# Patient Record
Sex: Female | Born: 1935
Health system: Southern US, Community
[De-identification: ages and names within clinical notes are randomized; demographics above are authoritative.]

## PROBLEM LIST (undated history)

## (undated) DIAGNOSIS — E785 Hyperlipidemia, unspecified: Secondary | ICD-10-CM

## (undated) DIAGNOSIS — I2510 Atherosclerotic heart disease of native coronary artery without angina pectoris: Secondary | ICD-10-CM

## (undated) DIAGNOSIS — F0390 Unspecified dementia without behavioral disturbance: Secondary | ICD-10-CM

## (undated) DIAGNOSIS — E119 Type 2 diabetes mellitus without complications: Secondary | ICD-10-CM

## (undated) DIAGNOSIS — I10 Essential (primary) hypertension: Secondary | ICD-10-CM

## (undated) DIAGNOSIS — E079 Disorder of thyroid, unspecified: Secondary | ICD-10-CM

## (undated) DIAGNOSIS — M1990 Unspecified osteoarthritis, unspecified site: Secondary | ICD-10-CM

## (undated) DIAGNOSIS — I701 Atherosclerosis of renal artery: Secondary | ICD-10-CM

## (undated) HISTORY — DX: Type 2 diabetes mellitus without complications: E11.9

## (undated) HISTORY — DX: Essential (primary) hypertension: I10

## (undated) HISTORY — DX: Hyperlipidemia, unspecified: E78.5

## (undated) HISTORY — PX: ABDOMINAL HYSTERECTOMY: SHX81

## (undated) HISTORY — PX: BREAST SURGERY: SHX581

## (undated) HISTORY — PX: MITRAL VALVE REPLACEMENT (MVR)/CORONARY ARTERY BYPASS GRAFTING (CABG): SHX5984

## (undated) HISTORY — DX: Unspecified osteoarthritis, unspecified site: M19.90

## (undated) HISTORY — PX: OTHER SURGICAL HISTORY: SHX169

## (undated) HISTORY — DX: Atherosclerosis of renal artery: I70.1

## (undated) HISTORY — PX: CORONARY ARTERY BYPASS GRAFT: SHX141

## (undated) HISTORY — DX: Atherosclerotic heart disease of native coronary artery without angina pectoris: I25.10

## (undated) HISTORY — PX: CHOLECYSTECTOMY: SHX55

## (undated) HISTORY — DX: Disorder of thyroid, unspecified: E07.9

## (undated) HISTORY — DX: Unspecified dementia, unspecified severity, without behavioral disturbance, psychotic disturbance, mood disturbance, and anxiety: F03.90

---

## 2004-03-19 ENCOUNTER — Ambulatory Visit: Payer: Self-pay | Admitting: Unknown Physician Specialty

## 2004-03-19 ENCOUNTER — Other Ambulatory Visit: Payer: Self-pay

## 2004-03-25 ENCOUNTER — Ambulatory Visit: Payer: Self-pay | Admitting: Unknown Physician Specialty

## 2004-06-20 ENCOUNTER — Ambulatory Visit: Payer: Self-pay | Admitting: Internal Medicine

## 2004-09-12 ENCOUNTER — Ambulatory Visit: Payer: Self-pay | Admitting: Internal Medicine

## 2005-06-19 ENCOUNTER — Ambulatory Visit: Payer: Self-pay | Admitting: Unknown Physician Specialty

## 2005-06-19 ENCOUNTER — Other Ambulatory Visit: Payer: Self-pay

## 2005-06-20 ENCOUNTER — Ambulatory Visit: Payer: Self-pay | Admitting: Unknown Physician Specialty

## 2005-06-20 HISTORY — PX: OTHER SURGICAL HISTORY: SHX169

## 2005-11-18 ENCOUNTER — Ambulatory Visit: Payer: Self-pay | Admitting: Internal Medicine

## 2006-02-18 ENCOUNTER — Ambulatory Visit: Payer: Self-pay | Admitting: Internal Medicine

## 2006-02-25 ENCOUNTER — Ambulatory Visit: Payer: Self-pay | Admitting: Internal Medicine

## 2006-05-25 ENCOUNTER — Ambulatory Visit: Payer: Self-pay | Admitting: Internal Medicine

## 2006-09-15 ENCOUNTER — Ambulatory Visit: Payer: Self-pay | Admitting: Internal Medicine

## 2007-02-03 ENCOUNTER — Ambulatory Visit: Payer: Self-pay | Admitting: General Surgery

## 2007-03-05 ENCOUNTER — Inpatient Hospital Stay: Payer: Self-pay | Admitting: Internal Medicine

## 2007-03-05 ENCOUNTER — Other Ambulatory Visit: Payer: Self-pay

## 2007-08-12 ENCOUNTER — Ambulatory Visit: Payer: Self-pay | Admitting: Internal Medicine

## 2007-08-18 ENCOUNTER — Ambulatory Visit: Payer: Self-pay | Admitting: Internal Medicine

## 2007-09-20 ENCOUNTER — Ambulatory Visit: Payer: Self-pay | Admitting: General Surgery

## 2007-10-05 ENCOUNTER — Ambulatory Visit: Payer: Self-pay | Admitting: General Surgery

## 2007-10-06 ENCOUNTER — Ambulatory Visit: Payer: Self-pay | Admitting: General Surgery

## 2008-02-07 ENCOUNTER — Ambulatory Visit: Payer: Self-pay | Admitting: General Surgery

## 2008-07-31 ENCOUNTER — Ambulatory Visit: Payer: Self-pay | Admitting: Unknown Physician Specialty

## 2008-12-18 ENCOUNTER — Ambulatory Visit: Payer: Self-pay | Admitting: Nephrology

## 2009-01-04 ENCOUNTER — Ambulatory Visit: Payer: Self-pay | Admitting: Internal Medicine

## 2009-02-05 ENCOUNTER — Ambulatory Visit: Payer: Self-pay | Admitting: Unknown Physician Specialty

## 2009-02-12 ENCOUNTER — Ambulatory Visit: Payer: Self-pay | Admitting: Internal Medicine

## 2009-10-17 ENCOUNTER — Ambulatory Visit: Payer: Self-pay | Admitting: Pain Medicine

## 2009-11-08 ENCOUNTER — Ambulatory Visit: Payer: Self-pay | Admitting: Pain Medicine

## 2009-11-21 ENCOUNTER — Ambulatory Visit: Payer: Self-pay | Admitting: Pain Medicine

## 2009-12-06 ENCOUNTER — Ambulatory Visit: Payer: Self-pay | Admitting: Pain Medicine

## 2009-12-20 ENCOUNTER — Ambulatory Visit: Payer: Self-pay | Admitting: Pain Medicine

## 2010-01-02 ENCOUNTER — Ambulatory Visit: Payer: Self-pay | Admitting: Pain Medicine

## 2010-01-30 ENCOUNTER — Ambulatory Visit: Payer: Self-pay | Admitting: Unknown Physician Specialty

## 2010-02-08 ENCOUNTER — Ambulatory Visit: Payer: Self-pay | Admitting: Unknown Physician Specialty

## 2010-02-14 ENCOUNTER — Inpatient Hospital Stay: Payer: Self-pay | Admitting: Unknown Physician Specialty

## 2010-02-19 ENCOUNTER — Encounter: Payer: Self-pay | Admitting: Internal Medicine

## 2010-02-26 ENCOUNTER — Encounter: Payer: Self-pay | Admitting: Internal Medicine

## 2010-08-27 ENCOUNTER — Ambulatory Visit: Payer: Self-pay | Admitting: Ophthalmology

## 2010-09-09 ENCOUNTER — Ambulatory Visit: Payer: Self-pay | Admitting: Ophthalmology

## 2011-05-06 DIAGNOSIS — I2510 Atherosclerotic heart disease of native coronary artery without angina pectoris: Secondary | ICD-10-CM | POA: Diagnosis not present

## 2011-05-06 DIAGNOSIS — I119 Hypertensive heart disease without heart failure: Secondary | ICD-10-CM | POA: Diagnosis not present

## 2011-05-06 DIAGNOSIS — I059 Rheumatic mitral valve disease, unspecified: Secondary | ICD-10-CM | POA: Diagnosis not present

## 2011-05-06 DIAGNOSIS — E782 Mixed hyperlipidemia: Secondary | ICD-10-CM | POA: Diagnosis not present

## 2011-05-22 DIAGNOSIS — H3530 Unspecified macular degeneration: Secondary | ICD-10-CM | POA: Diagnosis not present

## 2011-06-17 DIAGNOSIS — N184 Chronic kidney disease, stage 4 (severe): Secondary | ICD-10-CM | POA: Diagnosis not present

## 2011-06-17 DIAGNOSIS — I701 Atherosclerosis of renal artery: Secondary | ICD-10-CM | POA: Diagnosis not present

## 2011-06-17 DIAGNOSIS — N2581 Secondary hyperparathyroidism of renal origin: Secondary | ICD-10-CM | POA: Diagnosis not present

## 2011-06-17 DIAGNOSIS — I10 Essential (primary) hypertension: Secondary | ICD-10-CM | POA: Diagnosis not present

## 2011-06-23 DIAGNOSIS — F329 Major depressive disorder, single episode, unspecified: Secondary | ICD-10-CM | POA: Diagnosis not present

## 2011-06-23 DIAGNOSIS — E039 Hypothyroidism, unspecified: Secondary | ICD-10-CM | POA: Diagnosis not present

## 2011-06-23 DIAGNOSIS — M159 Polyosteoarthritis, unspecified: Secondary | ICD-10-CM | POA: Diagnosis not present

## 2011-06-23 DIAGNOSIS — D518 Other vitamin B12 deficiency anemias: Secondary | ICD-10-CM | POA: Diagnosis not present

## 2011-06-23 DIAGNOSIS — I10 Essential (primary) hypertension: Secondary | ICD-10-CM | POA: Diagnosis not present

## 2011-07-14 DIAGNOSIS — M549 Dorsalgia, unspecified: Secondary | ICD-10-CM | POA: Diagnosis not present

## 2011-07-14 DIAGNOSIS — N184 Chronic kidney disease, stage 4 (severe): Secondary | ICD-10-CM | POA: Diagnosis not present

## 2011-07-14 DIAGNOSIS — M5430 Sciatica, unspecified side: Secondary | ICD-10-CM | POA: Diagnosis not present

## 2011-07-14 DIAGNOSIS — I2510 Atherosclerotic heart disease of native coronary artery without angina pectoris: Secondary | ICD-10-CM | POA: Diagnosis not present

## 2011-09-08 DIAGNOSIS — E785 Hyperlipidemia, unspecified: Secondary | ICD-10-CM | POA: Diagnosis not present

## 2011-09-08 DIAGNOSIS — I10 Essential (primary) hypertension: Secondary | ICD-10-CM | POA: Diagnosis not present

## 2011-09-08 DIAGNOSIS — E669 Obesity, unspecified: Secondary | ICD-10-CM | POA: Diagnosis not present

## 2011-09-08 DIAGNOSIS — I722 Aneurysm of renal artery: Secondary | ICD-10-CM | POA: Diagnosis not present

## 2011-09-09 ENCOUNTER — Ambulatory Visit: Payer: Self-pay | Admitting: Internal Medicine

## 2011-09-09 DIAGNOSIS — Z1231 Encounter for screening mammogram for malignant neoplasm of breast: Secondary | ICD-10-CM | POA: Diagnosis not present

## 2011-09-17 DIAGNOSIS — I701 Atherosclerosis of renal artery: Secondary | ICD-10-CM | POA: Diagnosis not present

## 2011-09-17 DIAGNOSIS — N2581 Secondary hyperparathyroidism of renal origin: Secondary | ICD-10-CM | POA: Diagnosis not present

## 2011-09-17 DIAGNOSIS — I10 Essential (primary) hypertension: Secondary | ICD-10-CM | POA: Diagnosis not present

## 2011-10-21 DIAGNOSIS — I10 Essential (primary) hypertension: Secondary | ICD-10-CM | POA: Diagnosis not present

## 2011-10-21 DIAGNOSIS — R0602 Shortness of breath: Secondary | ICD-10-CM | POA: Diagnosis not present

## 2011-10-21 DIAGNOSIS — I6529 Occlusion and stenosis of unspecified carotid artery: Secondary | ICD-10-CM | POA: Diagnosis not present

## 2011-10-21 DIAGNOSIS — Z79899 Other long term (current) drug therapy: Secondary | ICD-10-CM | POA: Diagnosis not present

## 2011-10-21 DIAGNOSIS — M109 Gout, unspecified: Secondary | ICD-10-CM | POA: Diagnosis not present

## 2011-10-21 DIAGNOSIS — E782 Mixed hyperlipidemia: Secondary | ICD-10-CM | POA: Diagnosis not present

## 2011-10-21 DIAGNOSIS — E039 Hypothyroidism, unspecified: Secondary | ICD-10-CM | POA: Diagnosis not present

## 2011-10-21 DIAGNOSIS — R5381 Other malaise: Secondary | ICD-10-CM | POA: Diagnosis not present

## 2011-10-21 DIAGNOSIS — I2510 Atherosclerotic heart disease of native coronary artery without angina pectoris: Secondary | ICD-10-CM | POA: Diagnosis not present

## 2011-10-21 DIAGNOSIS — M545 Low back pain: Secondary | ICD-10-CM | POA: Diagnosis not present

## 2011-11-03 ENCOUNTER — Ambulatory Visit: Payer: Self-pay | Admitting: Internal Medicine

## 2011-11-03 DIAGNOSIS — R918 Other nonspecific abnormal finding of lung field: Secondary | ICD-10-CM | POA: Diagnosis not present

## 2011-11-03 DIAGNOSIS — R0602 Shortness of breath: Secondary | ICD-10-CM | POA: Diagnosis not present

## 2011-11-07 DIAGNOSIS — R0602 Shortness of breath: Secondary | ICD-10-CM | POA: Diagnosis not present

## 2011-11-13 DIAGNOSIS — M1990 Unspecified osteoarthritis, unspecified site: Secondary | ICD-10-CM | POA: Diagnosis not present

## 2011-11-13 DIAGNOSIS — M549 Dorsalgia, unspecified: Secondary | ICD-10-CM | POA: Diagnosis not present

## 2011-11-28 DIAGNOSIS — E785 Hyperlipidemia, unspecified: Secondary | ICD-10-CM | POA: Diagnosis not present

## 2011-11-28 DIAGNOSIS — I10 Essential (primary) hypertension: Secondary | ICD-10-CM | POA: Diagnosis not present

## 2011-11-28 DIAGNOSIS — I2510 Atherosclerotic heart disease of native coronary artery without angina pectoris: Secondary | ICD-10-CM | POA: Diagnosis not present

## 2011-11-28 DIAGNOSIS — E119 Type 2 diabetes mellitus without complications: Secondary | ICD-10-CM | POA: Diagnosis not present

## 2011-12-12 DIAGNOSIS — N184 Chronic kidney disease, stage 4 (severe): Secondary | ICD-10-CM | POA: Diagnosis not present

## 2011-12-12 DIAGNOSIS — I10 Essential (primary) hypertension: Secondary | ICD-10-CM | POA: Diagnosis not present

## 2011-12-12 DIAGNOSIS — N2581 Secondary hyperparathyroidism of renal origin: Secondary | ICD-10-CM | POA: Diagnosis not present

## 2011-12-12 DIAGNOSIS — I701 Atherosclerosis of renal artery: Secondary | ICD-10-CM | POA: Diagnosis not present

## 2012-02-24 DIAGNOSIS — E211 Secondary hyperparathyroidism, not elsewhere classified: Secondary | ICD-10-CM | POA: Diagnosis not present

## 2012-02-24 DIAGNOSIS — I10 Essential (primary) hypertension: Secondary | ICD-10-CM | POA: Diagnosis not present

## 2012-02-24 DIAGNOSIS — Z23 Encounter for immunization: Secondary | ICD-10-CM | POA: Diagnosis not present

## 2012-02-24 DIAGNOSIS — E039 Hypothyroidism, unspecified: Secondary | ICD-10-CM | POA: Diagnosis not present

## 2012-02-24 DIAGNOSIS — E119 Type 2 diabetes mellitus without complications: Secondary | ICD-10-CM | POA: Diagnosis not present

## 2012-02-24 DIAGNOSIS — R5381 Other malaise: Secondary | ICD-10-CM | POA: Diagnosis not present

## 2012-02-24 DIAGNOSIS — D649 Anemia, unspecified: Secondary | ICD-10-CM | POA: Diagnosis not present

## 2012-02-24 DIAGNOSIS — E782 Mixed hyperlipidemia: Secondary | ICD-10-CM | POA: Diagnosis not present

## 2012-05-25 DIAGNOSIS — I701 Atherosclerosis of renal artery: Secondary | ICD-10-CM | POA: Diagnosis not present

## 2012-05-25 DIAGNOSIS — N2581 Secondary hyperparathyroidism of renal origin: Secondary | ICD-10-CM | POA: Diagnosis not present

## 2012-05-25 DIAGNOSIS — I10 Essential (primary) hypertension: Secondary | ICD-10-CM | POA: Diagnosis not present

## 2012-06-01 DIAGNOSIS — M545 Low back pain: Secondary | ICD-10-CM | POA: Diagnosis not present

## 2012-06-01 DIAGNOSIS — E875 Hyperkalemia: Secondary | ICD-10-CM | POA: Diagnosis not present

## 2012-06-01 DIAGNOSIS — E319 Polyglandular dysfunction, unspecified: Secondary | ICD-10-CM | POA: Diagnosis not present

## 2012-06-01 DIAGNOSIS — D729 Disorder of white blood cells, unspecified: Secondary | ICD-10-CM | POA: Diagnosis not present

## 2012-06-01 DIAGNOSIS — I10 Essential (primary) hypertension: Secondary | ICD-10-CM | POA: Diagnosis not present

## 2012-06-01 DIAGNOSIS — E039 Hypothyroidism, unspecified: Secondary | ICD-10-CM | POA: Diagnosis not present

## 2012-06-01 DIAGNOSIS — E119 Type 2 diabetes mellitus without complications: Secondary | ICD-10-CM | POA: Diagnosis not present

## 2012-06-07 DIAGNOSIS — M5430 Sciatica, unspecified side: Secondary | ICD-10-CM | POA: Diagnosis not present

## 2012-06-07 DIAGNOSIS — M25559 Pain in unspecified hip: Secondary | ICD-10-CM | POA: Diagnosis not present

## 2012-06-14 ENCOUNTER — Ambulatory Visit: Payer: Self-pay | Admitting: Orthopedic Surgery

## 2012-06-14 DIAGNOSIS — M5126 Other intervertebral disc displacement, lumbar region: Secondary | ICD-10-CM | POA: Diagnosis not present

## 2012-06-14 DIAGNOSIS — M47817 Spondylosis without myelopathy or radiculopathy, lumbosacral region: Secondary | ICD-10-CM | POA: Diagnosis not present

## 2012-06-17 DIAGNOSIS — E785 Hyperlipidemia, unspecified: Secondary | ICD-10-CM | POA: Diagnosis not present

## 2012-06-17 DIAGNOSIS — R0989 Other specified symptoms and signs involving the circulatory and respiratory systems: Secondary | ICD-10-CM | POA: Diagnosis not present

## 2012-06-17 DIAGNOSIS — I10 Essential (primary) hypertension: Secondary | ICD-10-CM | POA: Diagnosis not present

## 2012-06-17 DIAGNOSIS — I2510 Atherosclerotic heart disease of native coronary artery without angina pectoris: Secondary | ICD-10-CM | POA: Diagnosis not present

## 2012-06-17 DIAGNOSIS — R0609 Other forms of dyspnea: Secondary | ICD-10-CM | POA: Diagnosis not present

## 2012-07-06 DIAGNOSIS — IMO0002 Reserved for concepts with insufficient information to code with codable children: Secondary | ICD-10-CM | POA: Diagnosis not present

## 2012-07-06 DIAGNOSIS — M47817 Spondylosis without myelopathy or radiculopathy, lumbosacral region: Secondary | ICD-10-CM | POA: Diagnosis not present

## 2012-07-14 DIAGNOSIS — M48061 Spinal stenosis, lumbar region without neurogenic claudication: Secondary | ICD-10-CM | POA: Diagnosis not present

## 2012-07-14 DIAGNOSIS — E039 Hypothyroidism, unspecified: Secondary | ICD-10-CM | POA: Diagnosis not present

## 2012-07-14 DIAGNOSIS — M109 Gout, unspecified: Secondary | ICD-10-CM | POA: Diagnosis not present

## 2012-07-14 DIAGNOSIS — I10 Essential (primary) hypertension: Secondary | ICD-10-CM | POA: Diagnosis not present

## 2012-07-14 DIAGNOSIS — I2510 Atherosclerotic heart disease of native coronary artery without angina pectoris: Secondary | ICD-10-CM | POA: Diagnosis not present

## 2012-07-14 DIAGNOSIS — Z006 Encounter for examination for normal comparison and control in clinical research program: Secondary | ICD-10-CM | POA: Diagnosis not present

## 2012-07-14 DIAGNOSIS — R05 Cough: Secondary | ICD-10-CM | POA: Diagnosis not present

## 2012-07-14 DIAGNOSIS — F329 Major depressive disorder, single episode, unspecified: Secondary | ICD-10-CM | POA: Diagnosis not present

## 2012-07-14 DIAGNOSIS — E119 Type 2 diabetes mellitus without complications: Secondary | ICD-10-CM | POA: Diagnosis not present

## 2012-07-28 DIAGNOSIS — M961 Postlaminectomy syndrome, not elsewhere classified: Secondary | ICD-10-CM | POA: Diagnosis not present

## 2012-07-30 DIAGNOSIS — H3530 Unspecified macular degeneration: Secondary | ICD-10-CM | POA: Diagnosis not present

## 2012-08-17 DIAGNOSIS — H905 Unspecified sensorineural hearing loss: Secondary | ICD-10-CM | POA: Diagnosis not present

## 2012-09-14 DIAGNOSIS — N2581 Secondary hyperparathyroidism of renal origin: Secondary | ICD-10-CM | POA: Diagnosis not present

## 2012-09-14 DIAGNOSIS — I10 Essential (primary) hypertension: Secondary | ICD-10-CM | POA: Diagnosis not present

## 2012-09-14 DIAGNOSIS — I701 Atherosclerosis of renal artery: Secondary | ICD-10-CM | POA: Diagnosis not present

## 2012-09-14 DIAGNOSIS — N184 Chronic kidney disease, stage 4 (severe): Secondary | ICD-10-CM | POA: Diagnosis not present

## 2012-10-05 DIAGNOSIS — I10 Essential (primary) hypertension: Secondary | ICD-10-CM | POA: Diagnosis not present

## 2012-10-05 DIAGNOSIS — D649 Anemia, unspecified: Secondary | ICD-10-CM | POA: Diagnosis not present

## 2012-10-05 DIAGNOSIS — M5137 Other intervertebral disc degeneration, lumbosacral region: Secondary | ICD-10-CM | POA: Diagnosis not present

## 2012-10-05 DIAGNOSIS — E039 Hypothyroidism, unspecified: Secondary | ICD-10-CM | POA: Diagnosis not present

## 2012-10-05 DIAGNOSIS — M159 Polyosteoarthritis, unspecified: Secondary | ICD-10-CM | POA: Diagnosis not present

## 2012-10-05 DIAGNOSIS — E1159 Type 2 diabetes mellitus with other circulatory complications: Secondary | ICD-10-CM | POA: Diagnosis not present

## 2012-10-05 DIAGNOSIS — I2510 Atherosclerotic heart disease of native coronary artery without angina pectoris: Secondary | ICD-10-CM | POA: Diagnosis not present

## 2012-11-11 DIAGNOSIS — M109 Gout, unspecified: Secondary | ICD-10-CM | POA: Diagnosis not present

## 2012-11-11 DIAGNOSIS — E559 Vitamin D deficiency, unspecified: Secondary | ICD-10-CM | POA: Diagnosis not present

## 2012-11-11 DIAGNOSIS — N183 Chronic kidney disease, stage 3 unspecified: Secondary | ICD-10-CM | POA: Diagnosis not present

## 2012-11-11 DIAGNOSIS — E039 Hypothyroidism, unspecified: Secondary | ICD-10-CM | POA: Diagnosis not present

## 2012-11-11 DIAGNOSIS — Z0000 Encounter for general adult medical examination without abnormal findings: Secondary | ICD-10-CM | POA: Diagnosis not present

## 2012-11-11 DIAGNOSIS — E782 Mixed hyperlipidemia: Secondary | ICD-10-CM | POA: Diagnosis not present

## 2012-12-06 DIAGNOSIS — N184 Chronic kidney disease, stage 4 (severe): Secondary | ICD-10-CM | POA: Diagnosis not present

## 2012-12-06 DIAGNOSIS — I10 Essential (primary) hypertension: Secondary | ICD-10-CM | POA: Diagnosis not present

## 2012-12-06 DIAGNOSIS — N2581 Secondary hyperparathyroidism of renal origin: Secondary | ICD-10-CM | POA: Diagnosis not present

## 2012-12-06 DIAGNOSIS — I701 Atherosclerosis of renal artery: Secondary | ICD-10-CM | POA: Diagnosis not present

## 2012-12-21 DIAGNOSIS — I209 Angina pectoris, unspecified: Secondary | ICD-10-CM | POA: Diagnosis not present

## 2012-12-21 DIAGNOSIS — R0602 Shortness of breath: Secondary | ICD-10-CM | POA: Diagnosis not present

## 2012-12-21 DIAGNOSIS — I059 Rheumatic mitral valve disease, unspecified: Secondary | ICD-10-CM | POA: Diagnosis not present

## 2012-12-21 DIAGNOSIS — E782 Mixed hyperlipidemia: Secondary | ICD-10-CM | POA: Diagnosis not present

## 2013-01-12 DIAGNOSIS — E119 Type 2 diabetes mellitus without complications: Secondary | ICD-10-CM | POA: Diagnosis not present

## 2013-01-12 DIAGNOSIS — Z0000 Encounter for general adult medical examination without abnormal findings: Secondary | ICD-10-CM | POA: Diagnosis not present

## 2013-01-12 DIAGNOSIS — E559 Vitamin D deficiency, unspecified: Secondary | ICD-10-CM | POA: Diagnosis not present

## 2013-01-12 DIAGNOSIS — D518 Other vitamin B12 deficiency anemias: Secondary | ICD-10-CM | POA: Diagnosis not present

## 2013-01-12 DIAGNOSIS — E782 Mixed hyperlipidemia: Secondary | ICD-10-CM | POA: Diagnosis not present

## 2013-01-12 DIAGNOSIS — M109 Gout, unspecified: Secondary | ICD-10-CM | POA: Diagnosis not present

## 2013-01-12 DIAGNOSIS — E039 Hypothyroidism, unspecified: Secondary | ICD-10-CM | POA: Diagnosis not present

## 2013-01-17 DIAGNOSIS — E782 Mixed hyperlipidemia: Secondary | ICD-10-CM | POA: Diagnosis not present

## 2013-01-17 DIAGNOSIS — I2510 Atherosclerotic heart disease of native coronary artery without angina pectoris: Secondary | ICD-10-CM | POA: Diagnosis not present

## 2013-01-17 DIAGNOSIS — I119 Hypertensive heart disease without heart failure: Secondary | ICD-10-CM | POA: Diagnosis not present

## 2013-01-17 DIAGNOSIS — I059 Rheumatic mitral valve disease, unspecified: Secondary | ICD-10-CM | POA: Diagnosis not present

## 2013-02-03 DIAGNOSIS — Z23 Encounter for immunization: Secondary | ICD-10-CM | POA: Diagnosis not present

## 2013-02-15 DIAGNOSIS — M48061 Spinal stenosis, lumbar region without neurogenic claudication: Secondary | ICD-10-CM | POA: Diagnosis not present

## 2013-02-15 DIAGNOSIS — M545 Low back pain: Secondary | ICD-10-CM | POA: Diagnosis not present

## 2013-02-15 DIAGNOSIS — IMO0002 Reserved for concepts with insufficient information to code with codable children: Secondary | ICD-10-CM | POA: Diagnosis not present

## 2013-02-28 DIAGNOSIS — N2581 Secondary hyperparathyroidism of renal origin: Secondary | ICD-10-CM | POA: Diagnosis not present

## 2013-02-28 DIAGNOSIS — I701 Atherosclerosis of renal artery: Secondary | ICD-10-CM | POA: Diagnosis not present

## 2013-02-28 DIAGNOSIS — N184 Chronic kidney disease, stage 4 (severe): Secondary | ICD-10-CM | POA: Diagnosis not present

## 2013-02-28 DIAGNOSIS — I10 Essential (primary) hypertension: Secondary | ICD-10-CM | POA: Diagnosis not present

## 2013-03-01 ENCOUNTER — Other Ambulatory Visit: Payer: Self-pay | Admitting: Neurosurgery

## 2013-03-01 DIAGNOSIS — M545 Low back pain: Secondary | ICD-10-CM

## 2013-03-07 ENCOUNTER — Ambulatory Visit
Admission: RE | Admit: 2013-03-07 | Discharge: 2013-03-07 | Disposition: A | Discharge status: Home / Self Care | Payer: PRIVATE HEALTH INSURANCE | Source: Ambulatory Visit | Attending: Neurosurgery | Admitting: Neurosurgery

## 2013-03-07 DIAGNOSIS — M48061 Spinal stenosis, lumbar region without neurogenic claudication: Secondary | ICD-10-CM | POA: Diagnosis not present

## 2013-03-07 DIAGNOSIS — M545 Low back pain: Secondary | ICD-10-CM

## 2013-03-07 DIAGNOSIS — M5137 Other intervertebral disc degeneration, lumbosacral region: Secondary | ICD-10-CM | POA: Diagnosis not present

## 2013-03-07 DIAGNOSIS — M5126 Other intervertebral disc displacement, lumbar region: Secondary | ICD-10-CM | POA: Diagnosis not present

## 2013-03-07 DIAGNOSIS — M47817 Spondylosis without myelopathy or radiculopathy, lumbosacral region: Secondary | ICD-10-CM | POA: Diagnosis not present

## 2013-03-07 MED ORDER — IOHEXOL 180 MG/ML  SOLN
15.0000 mL | Freq: Once | INTRAMUSCULAR | Status: AC | PRN
  Administered 2013-03-07: 15 mL via INTRATHECAL

## 2013-03-07 MED ORDER — DIAZEPAM 5 MG PO TABS
10.0000 mg | ORAL_TABLET | Freq: Once | ORAL | Status: AC
  Administered 2013-03-07: 5 mg via ORAL

## 2013-03-07 NOTE — Progress Notes (Signed)
Patient states her last dose of Tramadol was two days ago.  jkl

## 2013-03-11 DIAGNOSIS — M549 Dorsalgia, unspecified: Secondary | ICD-10-CM | POA: Diagnosis not present

## 2013-05-09 DIAGNOSIS — E785 Hyperlipidemia, unspecified: Secondary | ICD-10-CM | POA: Diagnosis not present

## 2013-05-09 DIAGNOSIS — I10 Essential (primary) hypertension: Secondary | ICD-10-CM | POA: Diagnosis not present

## 2013-05-09 DIAGNOSIS — I25810 Atherosclerosis of coronary artery bypass graft(s) without angina pectoris: Secondary | ICD-10-CM | POA: Diagnosis not present

## 2013-05-09 DIAGNOSIS — I059 Rheumatic mitral valve disease, unspecified: Secondary | ICD-10-CM | POA: Diagnosis not present

## 2013-05-17 DIAGNOSIS — I259 Chronic ischemic heart disease, unspecified: Secondary | ICD-10-CM | POA: Diagnosis not present

## 2013-05-17 DIAGNOSIS — I10 Essential (primary) hypertension: Secondary | ICD-10-CM | POA: Diagnosis not present

## 2013-05-17 DIAGNOSIS — R1909 Other intra-abdominal and pelvic swelling, mass and lump: Secondary | ICD-10-CM | POA: Diagnosis not present

## 2013-05-17 DIAGNOSIS — N183 Chronic kidney disease, stage 3 unspecified: Secondary | ICD-10-CM | POA: Diagnosis not present

## 2013-05-17 DIAGNOSIS — R6889 Other general symptoms and signs: Secondary | ICD-10-CM | POA: Diagnosis not present

## 2013-05-26 ENCOUNTER — Ambulatory Visit: Payer: Self-pay | Admitting: Physician Assistant

## 2013-05-26 DIAGNOSIS — R05 Cough: Secondary | ICD-10-CM | POA: Diagnosis not present

## 2013-05-26 DIAGNOSIS — R109 Unspecified abdominal pain: Secondary | ICD-10-CM | POA: Diagnosis not present

## 2013-05-26 DIAGNOSIS — R1909 Other intra-abdominal and pelvic swelling, mass and lump: Secondary | ICD-10-CM | POA: Diagnosis not present

## 2013-05-26 DIAGNOSIS — R059 Cough, unspecified: Secondary | ICD-10-CM | POA: Diagnosis not present

## 2013-06-27 DIAGNOSIS — F329 Major depressive disorder, single episode, unspecified: Secondary | ICD-10-CM | POA: Diagnosis not present

## 2013-06-27 DIAGNOSIS — M109 Gout, unspecified: Secondary | ICD-10-CM | POA: Diagnosis not present

## 2013-06-27 DIAGNOSIS — I10 Essential (primary) hypertension: Secondary | ICD-10-CM | POA: Diagnosis not present

## 2013-06-27 DIAGNOSIS — E782 Mixed hyperlipidemia: Secondary | ICD-10-CM | POA: Diagnosis not present

## 2013-06-27 DIAGNOSIS — F3289 Other specified depressive episodes: Secondary | ICD-10-CM | POA: Diagnosis not present

## 2013-07-19 DIAGNOSIS — Z79899 Other long term (current) drug therapy: Secondary | ICD-10-CM | POA: Diagnosis not present

## 2013-07-19 DIAGNOSIS — E039 Hypothyroidism, unspecified: Secondary | ICD-10-CM | POA: Diagnosis not present

## 2013-07-19 DIAGNOSIS — E782 Mixed hyperlipidemia: Secondary | ICD-10-CM | POA: Diagnosis not present

## 2013-08-09 DIAGNOSIS — F329 Major depressive disorder, single episode, unspecified: Secondary | ICD-10-CM | POA: Diagnosis not present

## 2013-08-09 DIAGNOSIS — M109 Gout, unspecified: Secondary | ICD-10-CM | POA: Diagnosis not present

## 2013-08-09 DIAGNOSIS — E782 Mixed hyperlipidemia: Secondary | ICD-10-CM | POA: Diagnosis not present

## 2013-08-09 DIAGNOSIS — F3289 Other specified depressive episodes: Secondary | ICD-10-CM | POA: Diagnosis not present

## 2013-08-09 DIAGNOSIS — I10 Essential (primary) hypertension: Secondary | ICD-10-CM | POA: Diagnosis not present

## 2013-08-31 DIAGNOSIS — H2510 Age-related nuclear cataract, unspecified eye: Secondary | ICD-10-CM | POA: Diagnosis not present

## 2013-09-22 DIAGNOSIS — M79609 Pain in unspecified limb: Secondary | ICD-10-CM | POA: Diagnosis not present

## 2013-09-22 DIAGNOSIS — S9030XA Contusion of unspecified foot, initial encounter: Secondary | ICD-10-CM | POA: Diagnosis not present

## 2013-10-06 DIAGNOSIS — M79609 Pain in unspecified limb: Secondary | ICD-10-CM | POA: Diagnosis not present

## 2013-10-06 DIAGNOSIS — S93609A Unspecified sprain of unspecified foot, initial encounter: Secondary | ICD-10-CM | POA: Diagnosis not present

## 2013-11-16 DIAGNOSIS — E785 Hyperlipidemia, unspecified: Secondary | ICD-10-CM | POA: Diagnosis not present

## 2013-11-16 DIAGNOSIS — I10 Essential (primary) hypertension: Secondary | ICD-10-CM | POA: Diagnosis not present

## 2013-11-16 DIAGNOSIS — I25810 Atherosclerosis of coronary artery bypass graft(s) without angina pectoris: Secondary | ICD-10-CM | POA: Diagnosis not present

## 2013-11-16 DIAGNOSIS — E782 Mixed hyperlipidemia: Secondary | ICD-10-CM | POA: Insufficient documentation

## 2013-11-16 DIAGNOSIS — I38 Endocarditis, valve unspecified: Secondary | ICD-10-CM | POA: Diagnosis not present

## 2013-11-17 DIAGNOSIS — I10 Essential (primary) hypertension: Secondary | ICD-10-CM | POA: Diagnosis not present

## 2013-11-17 DIAGNOSIS — N183 Chronic kidney disease, stage 3 unspecified: Secondary | ICD-10-CM | POA: Diagnosis not present

## 2013-11-17 DIAGNOSIS — I739 Peripheral vascular disease, unspecified: Secondary | ICD-10-CM | POA: Diagnosis not present

## 2013-11-17 DIAGNOSIS — L03119 Cellulitis of unspecified part of limb: Secondary | ICD-10-CM | POA: Diagnosis not present

## 2013-11-17 DIAGNOSIS — L02619 Cutaneous abscess of unspecified foot: Secondary | ICD-10-CM | POA: Diagnosis not present

## 2013-11-17 DIAGNOSIS — E039 Hypothyroidism, unspecified: Secondary | ICD-10-CM | POA: Diagnosis not present

## 2013-11-22 DIAGNOSIS — I25810 Atherosclerosis of coronary artery bypass graft(s) without angina pectoris: Secondary | ICD-10-CM | POA: Diagnosis not present

## 2013-11-22 DIAGNOSIS — R609 Edema, unspecified: Secondary | ICD-10-CM | POA: Diagnosis not present

## 2013-11-22 DIAGNOSIS — I10 Essential (primary) hypertension: Secondary | ICD-10-CM | POA: Diagnosis not present

## 2013-11-22 DIAGNOSIS — I38 Endocarditis, valve unspecified: Secondary | ICD-10-CM | POA: Diagnosis not present

## 2013-11-22 DIAGNOSIS — E785 Hyperlipidemia, unspecified: Secondary | ICD-10-CM | POA: Diagnosis not present

## 2013-11-23 DIAGNOSIS — R609 Edema, unspecified: Secondary | ICD-10-CM | POA: Diagnosis not present

## 2013-11-23 DIAGNOSIS — M79609 Pain in unspecified limb: Secondary | ICD-10-CM | POA: Diagnosis not present

## 2013-11-24 DIAGNOSIS — E1159 Type 2 diabetes mellitus with other circulatory complications: Secondary | ICD-10-CM | POA: Diagnosis not present

## 2013-11-24 DIAGNOSIS — N183 Chronic kidney disease, stage 3 unspecified: Secondary | ICD-10-CM | POA: Diagnosis not present

## 2013-11-24 DIAGNOSIS — G8929 Other chronic pain: Secondary | ICD-10-CM | POA: Diagnosis not present

## 2013-11-24 DIAGNOSIS — L03119 Cellulitis of unspecified part of limb: Secondary | ICD-10-CM | POA: Diagnosis not present

## 2013-11-24 DIAGNOSIS — I10 Essential (primary) hypertension: Secondary | ICD-10-CM | POA: Diagnosis not present

## 2013-11-24 DIAGNOSIS — L02619 Cutaneous abscess of unspecified foot: Secondary | ICD-10-CM | POA: Diagnosis not present

## 2013-11-24 DIAGNOSIS — I739 Peripheral vascular disease, unspecified: Secondary | ICD-10-CM | POA: Diagnosis not present

## 2013-12-08 DIAGNOSIS — E782 Mixed hyperlipidemia: Secondary | ICD-10-CM | POA: Diagnosis not present

## 2013-12-08 DIAGNOSIS — I10 Essential (primary) hypertension: Secondary | ICD-10-CM | POA: Diagnosis not present

## 2013-12-19 DIAGNOSIS — M79609 Pain in unspecified limb: Secondary | ICD-10-CM | POA: Diagnosis not present

## 2013-12-29 DIAGNOSIS — E119 Type 2 diabetes mellitus without complications: Secondary | ICD-10-CM | POA: Diagnosis not present

## 2013-12-29 DIAGNOSIS — G8929 Other chronic pain: Secondary | ICD-10-CM | POA: Diagnosis not present

## 2013-12-29 DIAGNOSIS — N183 Chronic kidney disease, stage 3 unspecified: Secondary | ICD-10-CM | POA: Diagnosis not present

## 2013-12-29 DIAGNOSIS — I10 Essential (primary) hypertension: Secondary | ICD-10-CM | POA: Diagnosis not present

## 2013-12-29 DIAGNOSIS — L02619 Cutaneous abscess of unspecified foot: Secondary | ICD-10-CM | POA: Diagnosis not present

## 2014-02-14 DIAGNOSIS — I150 Renovascular hypertension: Secondary | ICD-10-CM | POA: Diagnosis not present

## 2014-02-14 DIAGNOSIS — M150 Primary generalized (osteo)arthritis: Secondary | ICD-10-CM | POA: Diagnosis not present

## 2014-02-14 DIAGNOSIS — E782 Mixed hyperlipidemia: Secondary | ICD-10-CM | POA: Diagnosis not present

## 2014-02-14 DIAGNOSIS — Z23 Encounter for immunization: Secondary | ICD-10-CM | POA: Diagnosis not present

## 2014-02-14 DIAGNOSIS — I259 Chronic ischemic heart disease, unspecified: Secondary | ICD-10-CM | POA: Diagnosis not present

## 2014-02-14 DIAGNOSIS — N183 Chronic kidney disease, stage 3 (moderate): Secondary | ICD-10-CM | POA: Diagnosis not present

## 2014-02-14 DIAGNOSIS — M10479 Other secondary gout, unspecified ankle and foot: Secondary | ICD-10-CM | POA: Diagnosis not present

## 2014-02-14 DIAGNOSIS — M5441 Lumbago with sciatica, right side: Secondary | ICD-10-CM | POA: Diagnosis not present

## 2014-03-09 DIAGNOSIS — N183 Chronic kidney disease, stage 3 (moderate): Secondary | ICD-10-CM | POA: Diagnosis not present

## 2014-03-09 DIAGNOSIS — E782 Mixed hyperlipidemia: Secondary | ICD-10-CM | POA: Diagnosis not present

## 2014-04-04 DIAGNOSIS — E039 Hypothyroidism, unspecified: Secondary | ICD-10-CM | POA: Diagnosis not present

## 2014-04-04 DIAGNOSIS — M5441 Lumbago with sciatica, right side: Secondary | ICD-10-CM | POA: Diagnosis not present

## 2014-04-04 DIAGNOSIS — I25111 Atherosclerotic heart disease of native coronary artery with angina pectoris with documented spasm: Secondary | ICD-10-CM | POA: Diagnosis not present

## 2014-04-04 DIAGNOSIS — I150 Renovascular hypertension: Secondary | ICD-10-CM | POA: Diagnosis not present

## 2014-04-04 DIAGNOSIS — N183 Chronic kidney disease, stage 3 (moderate): Secondary | ICD-10-CM | POA: Diagnosis not present

## 2014-04-04 DIAGNOSIS — E119 Type 2 diabetes mellitus without complications: Secondary | ICD-10-CM | POA: Diagnosis not present

## 2014-04-12 DIAGNOSIS — N183 Chronic kidney disease, stage 3 (moderate): Secondary | ICD-10-CM | POA: Diagnosis not present

## 2014-04-12 DIAGNOSIS — I10 Essential (primary) hypertension: Secondary | ICD-10-CM | POA: Diagnosis not present

## 2014-05-03 DIAGNOSIS — I38 Endocarditis, valve unspecified: Secondary | ICD-10-CM | POA: Diagnosis not present

## 2014-05-03 DIAGNOSIS — I10 Essential (primary) hypertension: Secondary | ICD-10-CM | POA: Diagnosis not present

## 2014-05-03 DIAGNOSIS — I25810 Atherosclerosis of coronary artery bypass graft(s) without angina pectoris: Secondary | ICD-10-CM | POA: Diagnosis not present

## 2014-05-03 DIAGNOSIS — E119 Type 2 diabetes mellitus without complications: Secondary | ICD-10-CM | POA: Diagnosis not present

## 2014-05-09 DIAGNOSIS — N183 Chronic kidney disease, stage 3 (moderate): Secondary | ICD-10-CM | POA: Diagnosis not present

## 2014-05-09 DIAGNOSIS — Z0001 Encounter for general adult medical examination with abnormal findings: Secondary | ICD-10-CM | POA: Diagnosis not present

## 2014-05-09 DIAGNOSIS — I150 Renovascular hypertension: Secondary | ICD-10-CM | POA: Diagnosis not present

## 2014-05-09 DIAGNOSIS — E119 Type 2 diabetes mellitus without complications: Secondary | ICD-10-CM | POA: Diagnosis not present

## 2014-05-09 DIAGNOSIS — M150 Primary generalized (osteo)arthritis: Secondary | ICD-10-CM | POA: Diagnosis not present

## 2014-05-09 DIAGNOSIS — N390 Urinary tract infection, site not specified: Secondary | ICD-10-CM | POA: Diagnosis not present

## 2014-06-22 DIAGNOSIS — N183 Chronic kidney disease, stage 3 (moderate): Secondary | ICD-10-CM | POA: Diagnosis not present

## 2014-06-22 DIAGNOSIS — N393 Stress incontinence (female) (male): Secondary | ICD-10-CM | POA: Diagnosis not present

## 2014-06-22 DIAGNOSIS — I150 Renovascular hypertension: Secondary | ICD-10-CM | POA: Diagnosis not present

## 2014-06-22 DIAGNOSIS — N390 Urinary tract infection, site not specified: Secondary | ICD-10-CM | POA: Diagnosis not present

## 2014-06-26 ENCOUNTER — Ambulatory Visit: Payer: Self-pay | Admitting: Internal Medicine

## 2014-06-26 DIAGNOSIS — Z1231 Encounter for screening mammogram for malignant neoplasm of breast: Secondary | ICD-10-CM | POA: Diagnosis not present

## 2014-08-03 DIAGNOSIS — N390 Urinary tract infection, site not specified: Secondary | ICD-10-CM | POA: Diagnosis not present

## 2014-08-03 DIAGNOSIS — I10 Essential (primary) hypertension: Secondary | ICD-10-CM | POA: Diagnosis not present

## 2014-08-03 DIAGNOSIS — E039 Hypothyroidism, unspecified: Secondary | ICD-10-CM | POA: Diagnosis not present

## 2014-08-03 IMAGING — RF DG MYELOGRAM LUMBAR
12 of 17 series · 12 of 17 positions shown · non-contrast
Comparison: MRI 06/14/2012 [HOSPITAL].

CLINICAL DATA: Back pain following spinal decompression. Persistent
back pain.

EXAM:
LUMBAR MYELOGRAM
TECHNIQUE: Contiguous axial images were obtained through the lumbar spine
without infusion. Coronal, sagittal, and disc space reconstructions
were obtained of the axial image sets.

[Series 1: (hospital) · 1 of 1 slices shown]
[im 1/1]
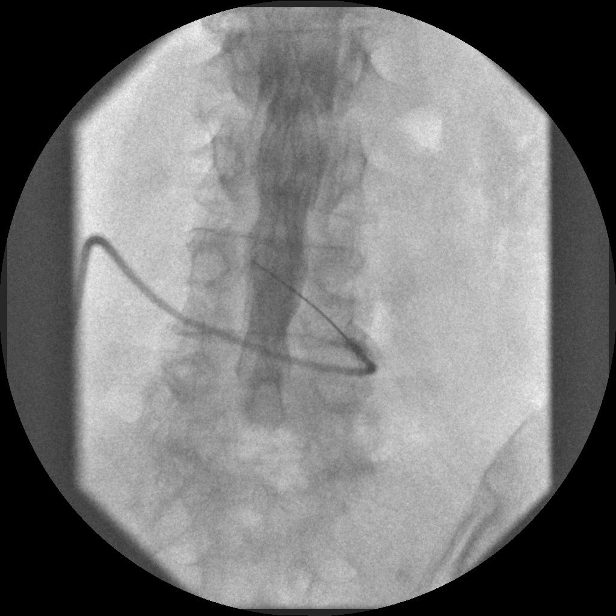

[Series 3: myelogram  white · 1 of 1 slices shown (1 of 8)]
[im 1/1]
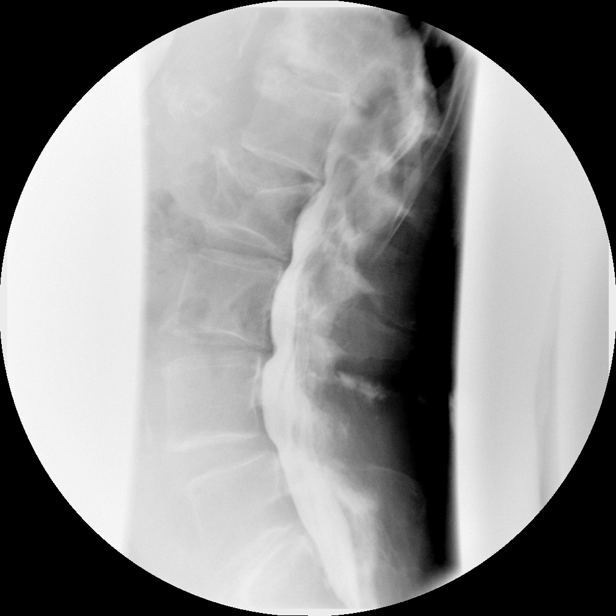

[Series 4: myelogram  white · 1 of 1 slices shown (2 of 8)]
[im 1/1]
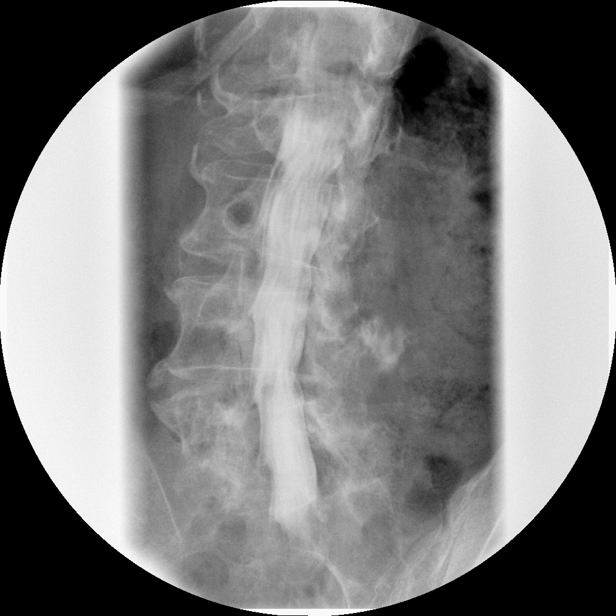

[Series 5: myelogram  white · 1 of 1 slices shown (3 of 8)]
[im 1/1]
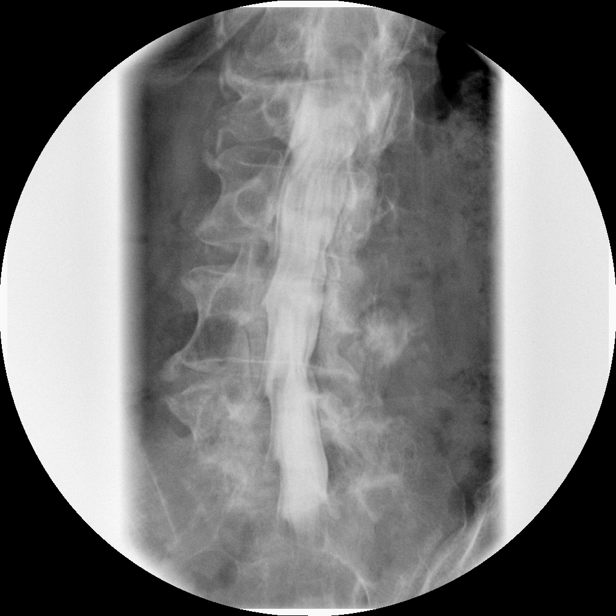

[Series 7: myelogram  white · 1 of 1 slices shown (4 of 8)]
[im 1/1]
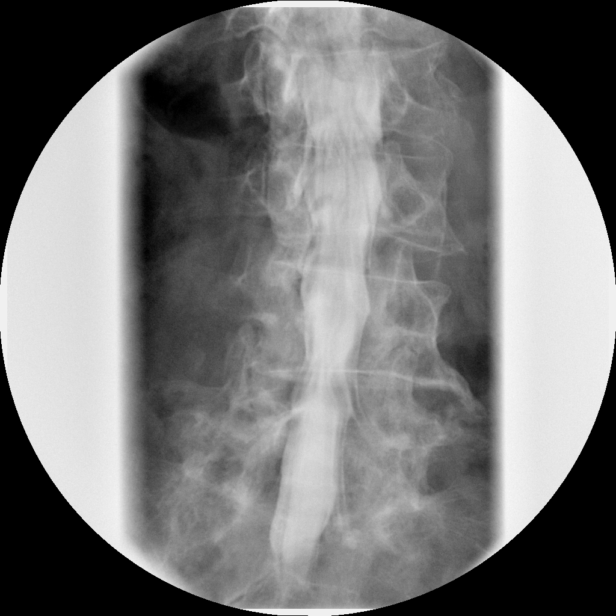

[Series 8: myelogram  white · 1 of 1 slices shown (5 of 8)]
[im 1/1]
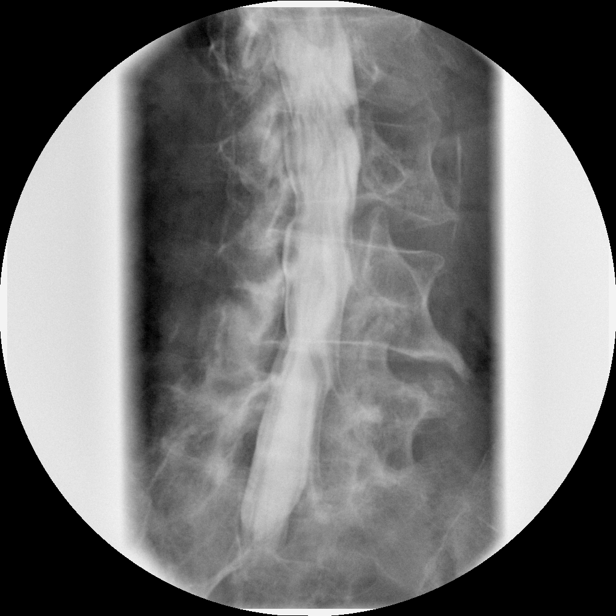

[Series 10: myelogram  white · 1 of 1 slices shown (6 of 8)]
[im 1/1]
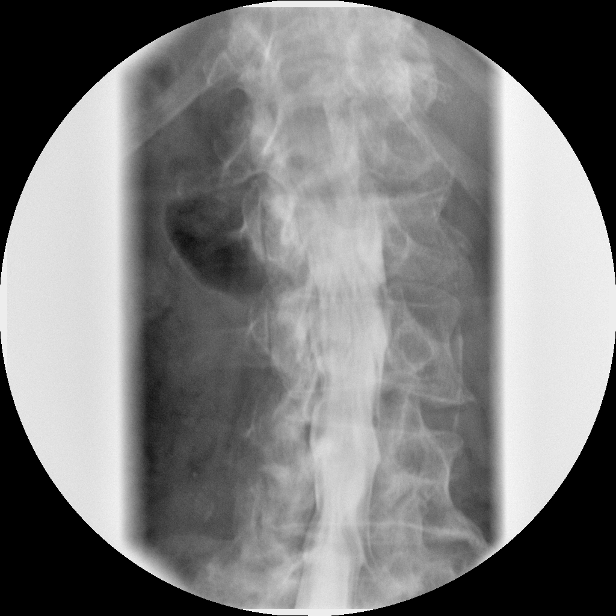

[Series 11: myelogram  white · 1 of 1 slices shown (7 of 8)]
[im 1/1]
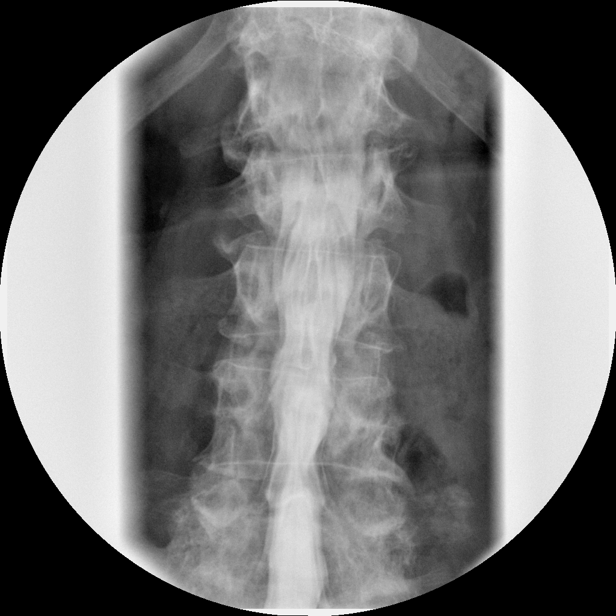

[Series 13: myelogram  white · 1 of 1 slices shown (8 of 8)]
[im 1/1]
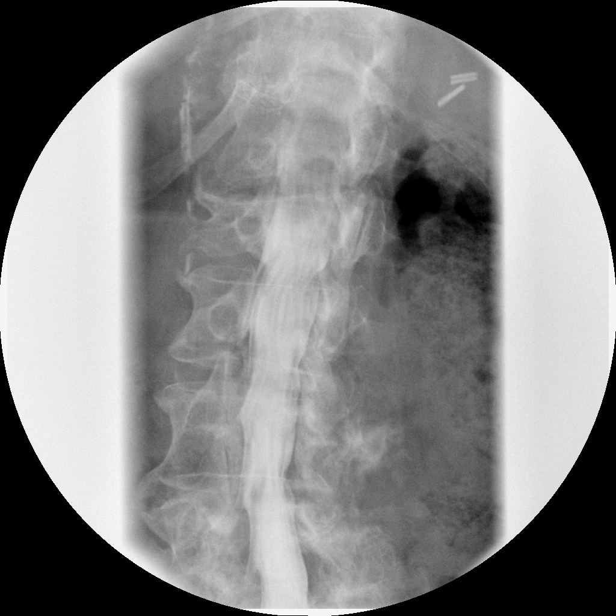

[Series 1001: view not recorded · 0.20mm/px · 1 of 1 slices shown (1 of 3)]
[im 1/1]
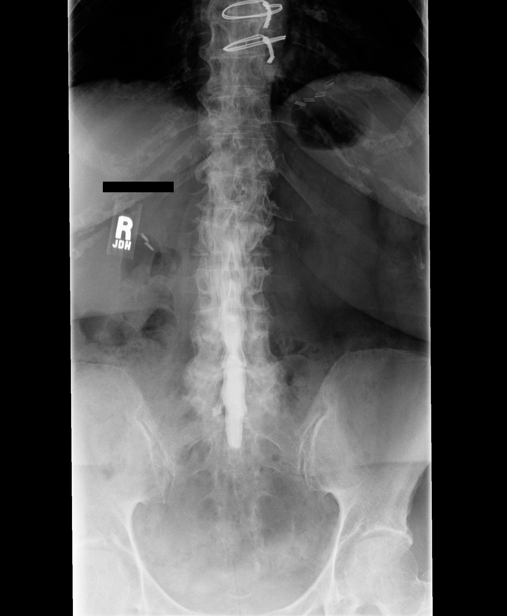

[Series 1002: view not recorded · 0.20mm/px · 1 of 1 slices shown (2 of 3)]
[im 1/1]
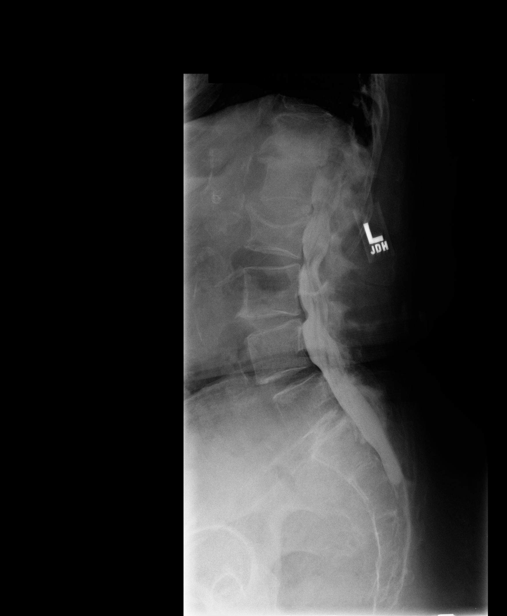

[Series 1004: view not recorded · 0.20mm/px · 1 of 1 slices shown (3 of 3)]
[im 1/1]
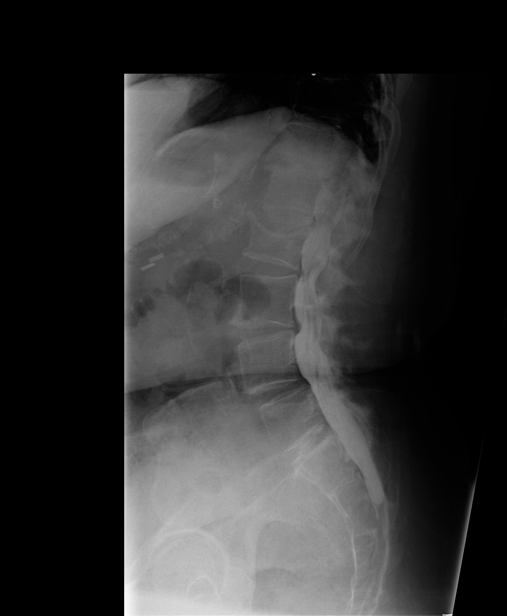

[12 of 17 positions shown; findings below may reference images not displayed]

FLUOROSCOPY TIME:  0 min 48 seconds

PROCEDURE:
After thorough discussion of risks and benefits of the procedure
including bleeding, infection, injury to nerves, blood vessels,
adjacent structures as well as headache and CSF leak, written and
oral informed consent was obtained. Consent was obtained by Dr.
Naria Onofre. Time out form was completed.

Patient was positioned prone on the fluoroscopy table. Local
anesthesia was provided with 1% lidocaine without epinephrine after
prepped and draped in the usual sterile fashion. Puncture was
performed at L4 using a 3 1/2 inch 22-gauge spinal needle via
midline approach. Using a single pass through the dura, the needle
was placed within the thecal sac, with return of clear CSF. 15 mL of
Imnipaque-0LN was injected into the thecal sac, with normal
opacification of the nerve roots and cauda equina consistent with
free flow within the subarachnoid space.

I personally performed the lumbar puncture and administered the
intrathecal contrast. I also personally supervised acquisition of
the myelogram images.
FINDINGS: LUMBAR MYELOGRAM FINDINGS:

Grade I anterolisthesis of L4 on L5 is present which does not change
through flexion and extension maneuvers. This grade I
anterolisthesis measures 3 millimeters. There is no central stenosis
at L4-L5 status post decompression. Mild central stenosis at L3-L4
associated with disc bulging. Chronic L2 superior endplate
compression fracture is present. Severe T12-L1 degenerative changes
are present with sclerosis of the endplates. L5-S1 level appears
normal.

Right renal atrophy is present. The patient is aware of "1 kidney" .

On the myelogram images, there appear to be conjoined nerve roots
involving the left L5 and S1 nerves at the level of the L4-L5 disc
space. Additionally, the right L5 and S1 nerve roots also appear
partially conjoined. On standing upright radiographs, there is a
mild dextro convex curve with the apex at L2. This appears secondary
to the compression fracture.

CT LUMBAR MYELOGRAM FINDINGS:

In the supine position, grade I anterolisthesis of L4 on L5
measuring 3 millimeters persists. Spinal cord terminates posterior
to the L1-L2 interspace. Vertebral body height at other levels
appears unchanged compared to the prior MRI. Aortoiliac
atherosclerosis is present. Right sacroiliac vacuum joint is
present. Ossification of the L5-S1 disk space results in functional
fusion. There is also ankylosis across the facet joints at this
level.

T12-L1: Severe degenerative disc disease with vacuum disc and
collapse of the disk space. Severe degenerative endplate changes
appear similar to the prior MRI. Shallow broad-based endplate
osteophytes are present producing mild central stenosis without cord
deformity. Right foraminal stenosis is present associated with
endplate spurring. Mild right facet arthrosis.

L1-L2: Mild central stenosis associated with shallow disc bulging
and facet degeneration with ligamentum flavum redundancy. The
foramina appear patent. Mild crowding of the left lateral recess
without neural compression.

L2-L3: Mild disk degeneration with shallow broad-based disk bulging.
Mild central stenosis associated with degenerative disc and facet
disease with posterior ligamentum flavum redundancy. No compressive
stenosis. Foramina patent.

L3-L4: Bilateral laminotomies with decompression of the central
canal. Lateral recesses are patent. Moderate left and mild right
foraminal stenosis associated with residual/recurrent disk bulging.

L4-L5: Severe facet degeneration. Laminectomy of L4. Mild transverse
narrowing of the thecal sac associated with severe facet arthrosis.
Apparent bilateral foramina on these with the foramen adequately
patent bilaterally. Lateral recesses also appear patent.

L5-S1: Underfilling of the left S1 nerve root is probably chronic
and associated with scarring. Ankylosis of this level. Foramina
appear adequately patent.
IMPRESSION: 1. Technically successful lumbar puncture for lumbar myelogram.
2. Postoperative changes of bilateral L3 laminotomies and L4
laminectomy. Could decompression of the central canal and lateral
recesses at these levels. L3-L4 left-greater-than-right foraminal
stenosis associated with disc bulging. Severe L4-L5 facet arthrosis
accounts for anterolisthesis. No recurrent L4-L5 foraminal stenosis.
3. L5-S1 ankylosis. No stenosis.
4. Chronic L2 superior endplate compression fracture.
5. Severe T12-L1 degenerative disc disease with collapse of the disk
space and marked degenerative remodeling of both T12 and L1
endplates. Mild central stenosis results.

## 2014-08-22 DIAGNOSIS — E039 Hypothyroidism, unspecified: Secondary | ICD-10-CM | POA: Diagnosis not present

## 2014-08-22 DIAGNOSIS — M159 Polyosteoarthritis, unspecified: Secondary | ICD-10-CM | POA: Diagnosis not present

## 2014-08-22 DIAGNOSIS — I150 Renovascular hypertension: Secondary | ICD-10-CM | POA: Diagnosis not present

## 2014-08-22 DIAGNOSIS — N183 Chronic kidney disease, stage 3 (moderate): Secondary | ICD-10-CM | POA: Diagnosis not present

## 2014-11-14 DIAGNOSIS — E119 Type 2 diabetes mellitus without complications: Secondary | ICD-10-CM | POA: Diagnosis not present

## 2014-11-21 DIAGNOSIS — N183 Chronic kidney disease, stage 3 (moderate): Secondary | ICD-10-CM | POA: Diagnosis not present

## 2014-11-21 DIAGNOSIS — E119 Type 2 diabetes mellitus without complications: Secondary | ICD-10-CM | POA: Diagnosis not present

## 2014-11-21 DIAGNOSIS — I150 Renovascular hypertension: Secondary | ICD-10-CM | POA: Diagnosis not present

## 2014-11-21 DIAGNOSIS — M5441 Lumbago with sciatica, right side: Secondary | ICD-10-CM | POA: Diagnosis not present

## 2014-11-21 DIAGNOSIS — G2581 Restless legs syndrome: Secondary | ICD-10-CM | POA: Diagnosis not present

## 2014-11-21 DIAGNOSIS — E039 Hypothyroidism, unspecified: Secondary | ICD-10-CM | POA: Diagnosis not present

## 2014-11-27 DIAGNOSIS — I10 Essential (primary) hypertension: Secondary | ICD-10-CM | POA: Diagnosis not present

## 2014-11-27 DIAGNOSIS — I25709 Atherosclerosis of coronary artery bypass graft(s), unspecified, with unspecified angina pectoris: Secondary | ICD-10-CM | POA: Diagnosis not present

## 2014-11-27 DIAGNOSIS — R0602 Shortness of breath: Secondary | ICD-10-CM | POA: Diagnosis not present

## 2014-11-27 DIAGNOSIS — E782 Mixed hyperlipidemia: Secondary | ICD-10-CM | POA: Diagnosis not present

## 2014-12-14 DIAGNOSIS — I071 Rheumatic tricuspid insufficiency: Secondary | ICD-10-CM | POA: Diagnosis not present

## 2014-12-14 DIAGNOSIS — I25709 Atherosclerosis of coronary artery bypass graft(s), unspecified, with unspecified angina pectoris: Secondary | ICD-10-CM | POA: Diagnosis not present

## 2014-12-14 DIAGNOSIS — I272 Other secondary pulmonary hypertension: Secondary | ICD-10-CM | POA: Diagnosis not present

## 2014-12-14 DIAGNOSIS — R0602 Shortness of breath: Secondary | ICD-10-CM | POA: Diagnosis not present

## 2014-12-14 DIAGNOSIS — I10 Essential (primary) hypertension: Secondary | ICD-10-CM | POA: Diagnosis not present

## 2014-12-14 DIAGNOSIS — I25708 Atherosclerosis of coronary artery bypass graft(s), unspecified, with other forms of angina pectoris: Secondary | ICD-10-CM | POA: Diagnosis not present

## 2015-01-05 DIAGNOSIS — I10 Essential (primary) hypertension: Secondary | ICD-10-CM | POA: Diagnosis not present

## 2015-02-14 DIAGNOSIS — I25810 Atherosclerosis of coronary artery bypass graft(s) without angina pectoris: Secondary | ICD-10-CM | POA: Diagnosis not present

## 2015-02-14 DIAGNOSIS — I340 Nonrheumatic mitral (valve) insufficiency: Secondary | ICD-10-CM | POA: Diagnosis not present

## 2015-02-14 DIAGNOSIS — H353131 Nonexudative age-related macular degeneration, bilateral, early dry stage: Secondary | ICD-10-CM | POA: Diagnosis not present

## 2015-02-14 DIAGNOSIS — I10 Essential (primary) hypertension: Secondary | ICD-10-CM | POA: Diagnosis not present

## 2015-03-29 DIAGNOSIS — N183 Chronic kidney disease, stage 3 (moderate): Secondary | ICD-10-CM | POA: Diagnosis not present

## 2015-03-29 DIAGNOSIS — E039 Hypothyroidism, unspecified: Secondary | ICD-10-CM | POA: Diagnosis not present

## 2015-03-29 DIAGNOSIS — I25111 Atherosclerotic heart disease of native coronary artery with angina pectoris with documented spasm: Secondary | ICD-10-CM | POA: Diagnosis not present

## 2015-03-29 DIAGNOSIS — I10 Essential (primary) hypertension: Secondary | ICD-10-CM | POA: Diagnosis not present

## 2015-05-03 DIAGNOSIS — E875 Hyperkalemia: Secondary | ICD-10-CM | POA: Diagnosis not present

## 2015-06-14 DIAGNOSIS — I6523 Occlusion and stenosis of bilateral carotid arteries: Secondary | ICD-10-CM | POA: Diagnosis not present

## 2015-06-14 DIAGNOSIS — E782 Mixed hyperlipidemia: Secondary | ICD-10-CM | POA: Diagnosis not present

## 2015-06-14 DIAGNOSIS — Z0001 Encounter for general adult medical examination with abnormal findings: Secondary | ICD-10-CM | POA: Diagnosis not present

## 2015-06-14 DIAGNOSIS — N183 Chronic kidney disease, stage 3 (moderate): Secondary | ICD-10-CM | POA: Diagnosis not present

## 2015-06-14 DIAGNOSIS — E119 Type 2 diabetes mellitus without complications: Secondary | ICD-10-CM | POA: Diagnosis not present

## 2015-06-14 DIAGNOSIS — M5441 Lumbago with sciatica, right side: Secondary | ICD-10-CM | POA: Diagnosis not present

## 2015-06-14 DIAGNOSIS — I10 Essential (primary) hypertension: Secondary | ICD-10-CM | POA: Diagnosis not present

## 2015-06-14 DIAGNOSIS — E039 Hypothyroidism, unspecified: Secondary | ICD-10-CM | POA: Diagnosis not present

## 2015-06-14 DIAGNOSIS — N390 Urinary tract infection, site not specified: Secondary | ICD-10-CM | POA: Diagnosis not present

## 2015-07-18 DIAGNOSIS — Z1231 Encounter for screening mammogram for malignant neoplasm of breast: Secondary | ICD-10-CM | POA: Diagnosis not present

## 2015-07-18 DIAGNOSIS — M85852 Other specified disorders of bone density and structure, left thigh: Secondary | ICD-10-CM | POA: Diagnosis not present

## 2015-07-18 DIAGNOSIS — M85862 Other specified disorders of bone density and structure, left lower leg: Secondary | ICD-10-CM | POA: Diagnosis not present

## 2015-07-18 DIAGNOSIS — M810 Age-related osteoporosis without current pathological fracture: Secondary | ICD-10-CM | POA: Diagnosis not present

## 2015-08-15 DIAGNOSIS — H353131 Nonexudative age-related macular degeneration, bilateral, early dry stage: Secondary | ICD-10-CM | POA: Diagnosis not present

## 2015-09-20 DIAGNOSIS — I10 Essential (primary) hypertension: Secondary | ICD-10-CM | POA: Diagnosis not present

## 2015-09-20 DIAGNOSIS — I25810 Atherosclerosis of coronary artery bypass graft(s) without angina pectoris: Secondary | ICD-10-CM | POA: Diagnosis not present

## 2015-09-20 DIAGNOSIS — I272 Other secondary pulmonary hypertension: Secondary | ICD-10-CM | POA: Diagnosis not present

## 2015-10-18 DIAGNOSIS — N183 Chronic kidney disease, stage 3 (moderate): Secondary | ICD-10-CM | POA: Diagnosis not present

## 2015-10-18 DIAGNOSIS — I10 Essential (primary) hypertension: Secondary | ICD-10-CM | POA: Diagnosis not present

## 2015-10-18 DIAGNOSIS — E119 Type 2 diabetes mellitus without complications: Secondary | ICD-10-CM | POA: Diagnosis not present

## 2015-10-18 DIAGNOSIS — N390 Urinary tract infection, site not specified: Secondary | ICD-10-CM | POA: Diagnosis not present

## 2015-10-18 DIAGNOSIS — M150 Primary generalized (osteo)arthritis: Secondary | ICD-10-CM | POA: Diagnosis not present

## 2015-12-26 ENCOUNTER — Other Ambulatory Visit: Payer: Self-pay

## 2016-02-15 DIAGNOSIS — H353131 Nonexudative age-related macular degeneration, bilateral, early dry stage: Secondary | ICD-10-CM | POA: Diagnosis not present

## 2016-03-26 DIAGNOSIS — E782 Mixed hyperlipidemia: Secondary | ICD-10-CM | POA: Diagnosis not present

## 2016-03-26 DIAGNOSIS — I340 Nonrheumatic mitral (valve) insufficiency: Secondary | ICD-10-CM | POA: Diagnosis not present

## 2016-03-26 DIAGNOSIS — I10 Essential (primary) hypertension: Secondary | ICD-10-CM | POA: Diagnosis not present

## 2016-03-26 DIAGNOSIS — I25810 Atherosclerosis of coronary artery bypass graft(s) without angina pectoris: Secondary | ICD-10-CM | POA: Diagnosis not present

## 2016-04-10 DIAGNOSIS — Z23 Encounter for immunization: Secondary | ICD-10-CM | POA: Diagnosis not present

## 2016-04-10 DIAGNOSIS — K7581 Nonalcoholic steatohepatitis (NASH): Secondary | ICD-10-CM | POA: Diagnosis not present

## 2016-04-10 DIAGNOSIS — I10 Essential (primary) hypertension: Secondary | ICD-10-CM | POA: Diagnosis not present

## 2016-04-10 DIAGNOSIS — N183 Chronic kidney disease, stage 3 (moderate): Secondary | ICD-10-CM | POA: Diagnosis not present

## 2016-04-10 DIAGNOSIS — E039 Hypothyroidism, unspecified: Secondary | ICD-10-CM | POA: Diagnosis not present

## 2016-04-10 DIAGNOSIS — M150 Primary generalized (osteo)arthritis: Secondary | ICD-10-CM | POA: Diagnosis not present

## 2016-07-24 DIAGNOSIS — K7581 Nonalcoholic steatohepatitis (NASH): Secondary | ICD-10-CM | POA: Diagnosis not present

## 2016-07-24 DIAGNOSIS — E039 Hypothyroidism, unspecified: Secondary | ICD-10-CM | POA: Diagnosis not present

## 2016-07-24 DIAGNOSIS — I10 Essential (primary) hypertension: Secondary | ICD-10-CM | POA: Diagnosis not present

## 2016-07-24 DIAGNOSIS — E782 Mixed hyperlipidemia: Secondary | ICD-10-CM | POA: Diagnosis not present

## 2016-07-24 DIAGNOSIS — E119 Type 2 diabetes mellitus without complications: Secondary | ICD-10-CM | POA: Diagnosis not present

## 2016-08-13 DIAGNOSIS — H26492 Other secondary cataract, left eye: Secondary | ICD-10-CM | POA: Diagnosis not present

## 2016-08-20 DIAGNOSIS — N183 Chronic kidney disease, stage 3 (moderate): Secondary | ICD-10-CM | POA: Diagnosis not present

## 2016-08-20 DIAGNOSIS — Z0001 Encounter for general adult medical examination with abnormal findings: Secondary | ICD-10-CM | POA: Diagnosis not present

## 2016-08-20 DIAGNOSIS — E039 Hypothyroidism, unspecified: Secondary | ICD-10-CM | POA: Diagnosis not present

## 2016-08-20 DIAGNOSIS — M5441 Lumbago with sciatica, right side: Secondary | ICD-10-CM | POA: Diagnosis not present

## 2016-08-20 DIAGNOSIS — I150 Renovascular hypertension: Secondary | ICD-10-CM | POA: Diagnosis not present

## 2016-08-20 DIAGNOSIS — E119 Type 2 diabetes mellitus without complications: Secondary | ICD-10-CM | POA: Diagnosis not present

## 2016-12-18 DIAGNOSIS — I25810 Atherosclerosis of coronary artery bypass graft(s) without angina pectoris: Secondary | ICD-10-CM | POA: Diagnosis not present

## 2016-12-18 DIAGNOSIS — I071 Rheumatic tricuspid insufficiency: Secondary | ICD-10-CM | POA: Diagnosis not present

## 2016-12-18 DIAGNOSIS — R443 Hallucinations, unspecified: Secondary | ICD-10-CM | POA: Diagnosis not present

## 2016-12-18 DIAGNOSIS — M150 Primary generalized (osteo)arthritis: Secondary | ICD-10-CM | POA: Diagnosis not present

## 2016-12-18 DIAGNOSIS — I272 Pulmonary hypertension, unspecified: Secondary | ICD-10-CM | POA: Diagnosis not present

## 2016-12-18 DIAGNOSIS — F3289 Other specified depressive episodes: Secondary | ICD-10-CM | POA: Diagnosis not present

## 2016-12-18 DIAGNOSIS — E119 Type 2 diabetes mellitus without complications: Secondary | ICD-10-CM | POA: Diagnosis not present

## 2016-12-18 DIAGNOSIS — E782 Mixed hyperlipidemia: Secondary | ICD-10-CM | POA: Diagnosis not present

## 2016-12-18 DIAGNOSIS — I10 Essential (primary) hypertension: Secondary | ICD-10-CM | POA: Diagnosis not present

## 2017-02-09 DIAGNOSIS — E039 Hypothyroidism, unspecified: Secondary | ICD-10-CM | POA: Diagnosis not present

## 2017-02-09 DIAGNOSIS — F0281 Dementia in other diseases classified elsewhere with behavioral disturbance: Secondary | ICD-10-CM | POA: Diagnosis not present

## 2017-02-09 DIAGNOSIS — R443 Hallucinations, unspecified: Secondary | ICD-10-CM | POA: Diagnosis not present

## 2017-02-09 DIAGNOSIS — I10 Essential (primary) hypertension: Secondary | ICD-10-CM | POA: Diagnosis not present

## 2017-02-09 DIAGNOSIS — Z23 Encounter for immunization: Secondary | ICD-10-CM | POA: Diagnosis not present

## 2017-02-10 ENCOUNTER — Other Ambulatory Visit: Payer: Self-pay | Admitting: Nurse Practitioner

## 2017-02-10 DIAGNOSIS — F0281 Dementia in other diseases classified elsewhere with behavioral disturbance: Secondary | ICD-10-CM

## 2017-02-10 DIAGNOSIS — G309 Alzheimer's disease, unspecified: Principal | ICD-10-CM

## 2017-02-12 DIAGNOSIS — D509 Iron deficiency anemia, unspecified: Secondary | ICD-10-CM | POA: Diagnosis not present

## 2017-02-12 DIAGNOSIS — E039 Hypothyroidism, unspecified: Secondary | ICD-10-CM | POA: Diagnosis not present

## 2017-02-12 DIAGNOSIS — I10 Essential (primary) hypertension: Secondary | ICD-10-CM | POA: Diagnosis not present

## 2017-02-12 DIAGNOSIS — E559 Vitamin D deficiency, unspecified: Secondary | ICD-10-CM | POA: Diagnosis not present

## 2017-02-12 DIAGNOSIS — H353131 Nonexudative age-related macular degeneration, bilateral, early dry stage: Secondary | ICD-10-CM | POA: Diagnosis not present

## 2017-02-12 DIAGNOSIS — F0281 Dementia in other diseases classified elsewhere with behavioral disturbance: Secondary | ICD-10-CM | POA: Diagnosis not present

## 2017-02-12 DIAGNOSIS — E782 Mixed hyperlipidemia: Secondary | ICD-10-CM | POA: Diagnosis not present

## 2017-02-12 DIAGNOSIS — R443 Hallucinations, unspecified: Secondary | ICD-10-CM | POA: Diagnosis not present

## 2017-02-19 ENCOUNTER — Ambulatory Visit
Admission: RE | Admit: 2017-02-19 | Discharge: 2017-02-19 | Disposition: A | Discharge status: Home / Self Care | Payer: Medicare Other | Source: Ambulatory Visit | Attending: Nurse Practitioner | Admitting: Nurse Practitioner

## 2017-02-19 DIAGNOSIS — D519 Vitamin B12 deficiency anemia, unspecified: Secondary | ICD-10-CM | POA: Diagnosis not present

## 2017-02-19 DIAGNOSIS — G319 Degenerative disease of nervous system, unspecified: Secondary | ICD-10-CM | POA: Diagnosis not present

## 2017-02-19 DIAGNOSIS — R443 Hallucinations, unspecified: Secondary | ICD-10-CM | POA: Diagnosis not present

## 2017-02-19 DIAGNOSIS — M47892 Other spondylosis, cervical region: Secondary | ICD-10-CM | POA: Insufficient documentation

## 2017-02-19 DIAGNOSIS — G309 Alzheimer's disease, unspecified: Secondary | ICD-10-CM | POA: Insufficient documentation

## 2017-02-19 DIAGNOSIS — F0281 Dementia in other diseases classified elsewhere with behavioral disturbance: Secondary | ICD-10-CM | POA: Diagnosis not present

## 2017-02-19 DIAGNOSIS — I739 Peripheral vascular disease, unspecified: Secondary | ICD-10-CM | POA: Diagnosis not present

## 2017-02-24 DIAGNOSIS — E039 Hypothyroidism, unspecified: Secondary | ICD-10-CM | POA: Diagnosis not present

## 2017-02-24 DIAGNOSIS — I10 Essential (primary) hypertension: Secondary | ICD-10-CM | POA: Diagnosis not present

## 2017-02-24 DIAGNOSIS — R443 Hallucinations, unspecified: Secondary | ICD-10-CM | POA: Diagnosis not present

## 2017-02-24 DIAGNOSIS — D519 Vitamin B12 deficiency anemia, unspecified: Secondary | ICD-10-CM | POA: Diagnosis not present

## 2017-02-24 DIAGNOSIS — N189 Chronic kidney disease, unspecified: Secondary | ICD-10-CM | POA: Diagnosis not present

## 2017-03-02 DIAGNOSIS — D519 Vitamin B12 deficiency anemia, unspecified: Secondary | ICD-10-CM | POA: Diagnosis not present

## 2017-03-02 DIAGNOSIS — R944 Abnormal results of kidney function studies: Secondary | ICD-10-CM | POA: Diagnosis not present

## 2017-03-12 DIAGNOSIS — I10 Essential (primary) hypertension: Secondary | ICD-10-CM | POA: Diagnosis not present

## 2017-03-12 DIAGNOSIS — R443 Hallucinations, unspecified: Secondary | ICD-10-CM | POA: Diagnosis not present

## 2017-03-12 DIAGNOSIS — D519 Vitamin B12 deficiency anemia, unspecified: Secondary | ICD-10-CM | POA: Diagnosis not present

## 2017-03-12 DIAGNOSIS — N189 Chronic kidney disease, unspecified: Secondary | ICD-10-CM | POA: Diagnosis not present

## 2017-03-12 DIAGNOSIS — E039 Hypothyroidism, unspecified: Secondary | ICD-10-CM | POA: Diagnosis not present

## 2017-03-18 DIAGNOSIS — D519 Vitamin B12 deficiency anemia, unspecified: Secondary | ICD-10-CM | POA: Diagnosis not present

## 2017-04-01 DIAGNOSIS — E039 Hypothyroidism, unspecified: Secondary | ICD-10-CM | POA: Diagnosis not present

## 2017-04-07 ENCOUNTER — Ambulatory Visit: Payer: Self-pay | Admitting: Internal Medicine

## 2017-04-16 ENCOUNTER — Other Ambulatory Visit: Payer: Self-pay

## 2017-04-16 ENCOUNTER — Other Ambulatory Visit: Payer: Self-pay | Admitting: Nurse Practitioner

## 2017-04-16 MED ORDER — QUETIAPINE FUMARATE 25 MG PO TABS: 25.0000 mg | ORAL_TABLET | Freq: Every day | ORAL | 3 refills | Status: DC

## 2017-04-22 ENCOUNTER — Ambulatory Visit (INDEPENDENT_AMBULATORY_CARE_PROVIDER_SITE_OTHER): Payer: Medicare Other | Admitting: Nurse Practitioner

## 2017-04-22 ENCOUNTER — Encounter: Payer: Self-pay | Admitting: Nurse Practitioner

## 2017-04-22 VITALS — BP 144/59 | HR 72 | Resp 16 | Ht 62.0 in | Wt 160.4 lb

## 2017-04-22 DIAGNOSIS — E559 Vitamin D deficiency, unspecified: Secondary | ICD-10-CM | POA: Insufficient documentation

## 2017-04-22 DIAGNOSIS — F0281 Dementia in other diseases classified elsewhere with behavioral disturbance: Secondary | ICD-10-CM | POA: Diagnosis not present

## 2017-04-22 DIAGNOSIS — I150 Renovascular hypertension: Secondary | ICD-10-CM | POA: Insufficient documentation

## 2017-04-22 DIAGNOSIS — E538 Deficiency of other specified B group vitamins: Secondary | ICD-10-CM | POA: Diagnosis not present

## 2017-04-22 DIAGNOSIS — N183 Chronic kidney disease, stage 3 unspecified: Secondary | ICD-10-CM | POA: Insufficient documentation

## 2017-04-22 DIAGNOSIS — M150 Primary generalized (osteo)arthritis: Secondary | ICD-10-CM | POA: Insufficient documentation

## 2017-04-22 DIAGNOSIS — I2510 Atherosclerotic heart disease of native coronary artery without angina pectoris: Secondary | ICD-10-CM | POA: Insufficient documentation

## 2017-04-22 DIAGNOSIS — R443 Hallucinations, unspecified: Secondary | ICD-10-CM | POA: Diagnosis not present

## 2017-04-22 DIAGNOSIS — J309 Allergic rhinitis, unspecified: Secondary | ICD-10-CM

## 2017-04-22 DIAGNOSIS — F02818 Dementia in other diseases classified elsewhere, unspecified severity, with other behavioral disturbance: Secondary | ICD-10-CM | POA: Insufficient documentation

## 2017-04-22 DIAGNOSIS — I10 Essential (primary) hypertension: Secondary | ICD-10-CM | POA: Diagnosis not present

## 2017-04-22 DIAGNOSIS — D519 Vitamin B12 deficiency anemia, unspecified: Secondary | ICD-10-CM | POA: Insufficient documentation

## 2017-04-22 DIAGNOSIS — M5441 Lumbago with sciatica, right side: Secondary | ICD-10-CM | POA: Insufficient documentation

## 2017-04-22 DIAGNOSIS — R440 Auditory hallucinations: Secondary | ICD-10-CM | POA: Insufficient documentation

## 2017-04-22 DIAGNOSIS — I6523 Occlusion and stenosis of bilateral carotid arteries: Secondary | ICD-10-CM | POA: Insufficient documentation

## 2017-04-22 DIAGNOSIS — I739 Peripheral vascular disease, unspecified: Secondary | ICD-10-CM | POA: Insufficient documentation

## 2017-04-22 DIAGNOSIS — E039 Hypothyroidism, unspecified: Secondary | ICD-10-CM | POA: Insufficient documentation

## 2017-04-22 DIAGNOSIS — K7581 Nonalcoholic steatohepatitis (NASH): Secondary | ICD-10-CM | POA: Insufficient documentation

## 2017-04-22 DIAGNOSIS — E119 Type 2 diabetes mellitus without complications: Secondary | ICD-10-CM | POA: Insufficient documentation

## 2017-04-22 DIAGNOSIS — I259 Chronic ischemic heart disease, unspecified: Secondary | ICD-10-CM | POA: Insufficient documentation

## 2017-04-22 DIAGNOSIS — G2581 Restless legs syndrome: Secondary | ICD-10-CM | POA: Insufficient documentation

## 2017-04-22 DIAGNOSIS — E1165 Type 2 diabetes mellitus with hyperglycemia: Secondary | ICD-10-CM | POA: Insufficient documentation

## 2017-04-22 MED ORDER — PHENTERMINE HCL 37.5 MG PO TABS: 37.5000 mg | ORAL_TABLET | Freq: Every day | ORAL | 0 refills | Status: DC

## 2017-04-22 MED ORDER — CYANOCOBALAMIN 1000 MCG/ML IJ SOLN
1000.0000 ug | Freq: Once | INTRAMUSCULAR | Status: AC
  Administered 2017-04-22: 1000 ug via INTRAMUSCULAR

## 2017-04-22 NOTE — Progress Notes (Signed)
Subjective:     Patient ID: Maria Sanchez, female   DOB: 1930/09/08, 83 y.o.   MRN: [id number]  Patient here for f/u visit. She was started on low dose of seroquel at night. Has had only 3 episodes of hallucinations since her last visit. Is now staying at home, on her own, at night. Patient and family member are feelig comfortable with this again. She is sleeping better at night and is awake and being more active during the day. Drinking ensure at least once per day. Has had no weight gain or weight loss. Does report a bit of a headache at times, but otherwise, feels good.  She needs b12 injection today    Current Outpatient Medications:  .  allopurinol (ZYLOPRIM) 100 MG tablet, Take 100 mg by mouth 2 (two) times daily. For gout, Disp: , Rfl:  .  ALPRAZolam (XANAX) 0.25 MG tablet, Take 0.25 mg by mouth daily as needed (for nerves). , Disp: , Rfl:  .  amLODipine (NORVASC) 5 MG tablet, Take 5 mg daily by mouth., Disp: , Rfl:  .  aspirin EC 81 MG tablet, Take 81 mg daily by mouth., Disp: , Rfl:  .  bisoprolol-hydrochlorothiazide (ZIAC) 2.5-6.25 MG tablet, Take 1 tablet daily by mouth., Disp: , Rfl:  .  escitalopram (LEXAPRO) 10 MG tablet, Take 10 mg by mouth daily with supper. , Disp: , Rfl:  .  hydrALAZINE (APRESOLINE) 25 MG tablet, Take 25 mg by mouth 3 (three) times daily. , Disp: , Rfl:  .  levothyroxine (SYNTHROID, LEVOTHROID) 112 MCG tablet, Take 112 mcg daily before breakfast by mouth., Disp: , Rfl:  .  QUEtiapine (SEROQUEL) 25 MG tablet, Take 1 tablet (25 mg total) by mouth at bedtime. Take 1/2 to 1 tablet by mouth at bedtime as needed for sleep/anxiety, Disp: 30 tablet, Rfl: 3 .  simvastatin (ZOCOR) 20 MG tablet, Take 20 mg by mouth at bedtime. , Disp: , Rfl:  .  traMADol (ULTRAM) 50 MG tablet, Take by mouth 4 (four) times daily as needed. , Disp: , Rfl:   Review of Systems  Constitutional: Negative.  Negative for unexpected weight change.  HENT: Negative for congestion and postnasal  drip.   Eyes: Negative.   Respiratory: Negative for cough, chest tightness, shortness of breath and wheezing.   Cardiovascular: Positive for palpitations. Negative for chest pain.  Gastrointestinal: Negative for diarrhea, nausea and vomiting.       Increased coughing at night, liely due to reflex.   Endocrine: Negative.   Musculoskeletal: Negative.   Skin: Negative.   Allergic/Immunologic: Negative.   Neurological: Positive for headaches.  Hematological: Negative.   Psychiatric/Behavioral: Positive for hallucinations.       Today's Vitals   04/22/17 0906  BP: (!) 144/59  Pulse: 72  Resp: 16  SpO2: 96%  Weight: 160 lb 6.4 oz (72.8 kg)  Height: 5\' 2"  (1.575 m)   Objective:   Physical Exam  Constitutional: She is oriented to person, place, and time. She appears well-developed and well-nourished.  HENT:  Head: Normocephalic and atraumatic.  Eyes: Pupils are equal, round, and reactive to light.  Neck: Normal range of motion. Neck supple.  Cardiovascular: Normal rate and regular rhythm.  Pulmonary/Chest: Effort normal and breath sounds normal. She has no wheezes. She has no rales.  Abdominal: Soft. There is no tenderness.  Musculoskeletal: Normal range of motion.  Neurological: She is alert and oriented to person, place, and time.  Skin: Skin is warm and dry.  Psychiatric: She has a normal mood and affect.  Nursing note and vitals reviewed.      Assessment:     Hallucinations, unspecified  Dementia in other diseases classified elsewhere with behavioral disturbance  Essential (primary) hypertension  Allergic rhinitis, unspecified seasonality, unspecified trigger  Renovascular hypertension  B12 deficiency - Plan: cyanocobalamin ((VITAMIN B-12)) injection 1,000 mcg     Plan:     1. Symptoms much improved on 25mg  seroquel at night. No changes made today.  2. bp slightly elevated today, but generally well controlled. Continue bp medication as prescribed  3. Continue  allergy meds as prescribed  4. b12 injection administered today. Patient voicing interest in OTC b12 supplement rather than injections. Will start taking daily and will recheck levels prn.   Follow up 4 months and sooner if needed

## 2017-04-22 NOTE — Patient Instructions (Signed)
Vitamin B12 Deficiency Vitamin B12 deficiency means that your body is not getting enough vitamin B12. Your body needs vitamin B12 for important bodily functions. If you do not have enough vitamin B12 in your body, you can have health problems. Follow these instructions at home:  Take supplements only as told by your doctor. Follow the directions carefully.  Get any shots (injections) as told by your doctor. Do not miss your visits to the doctor.  Eat lots of healthy foods that contain vitamin B12. Ask your doctor if you should work with someone who is trained in how food affects health (dietitian). Foods that contain vitamin B12 include: ? Meat. ? Meat from birds (poultry). ? Fish. ? Eggs. ? Cereal and dairy products that are fortified. This means that vitamin B12 has been added to the food. Check the label on the package to see if the food is fortified.  Do not drink too much (do not abuse) alcohol.  Keep all follow-up visits as told by your doctor. This is important. Contact a doctor if:  Your symptoms come back. Get help right away if:  You have trouble breathing.  You have chest pain.  You get dizzy.  You pass out (lose consciousness). This information is not intended to replace advice given to you by your health care provider. Make sure you discuss any questions you have with your health care provider. Document Released: 04/03/2011 Document Revised: 09/20/2015 Document Reviewed: 08/30/2014 Elsevier Interactive Patient Education  2018 [employer]  

## 2017-05-22 ENCOUNTER — Other Ambulatory Visit: Payer: Self-pay | Admitting: Internal Medicine

## 2017-05-25 ENCOUNTER — Other Ambulatory Visit: Payer: Self-pay | Admitting: Internal Medicine

## 2017-05-26 ENCOUNTER — Other Ambulatory Visit: Payer: Self-pay | Admitting: Nurse Practitioner

## 2017-05-26 DIAGNOSIS — I10 Essential (primary) hypertension: Secondary | ICD-10-CM

## 2017-05-26 MED ORDER — METOPROLOL-HCTZ ER 25-12.5 MG PO TB24: 1.0000 | ORAL_TABLET | Freq: Every day | ORAL | 1 refills | Status: DC

## 2017-05-26 NOTE — Progress Notes (Signed)
Changed bisoprolol/HCTZ to metoprolol/HCTZ 25/12.5mg  daily. New rx sent to [facility].

## 2017-05-27 ENCOUNTER — Other Ambulatory Visit: Payer: Self-pay

## 2017-06-16 ENCOUNTER — Other Ambulatory Visit: Payer: Self-pay

## 2017-06-16 MED ORDER — HYDROCHLOROTHIAZIDE 12.5 MG PO TABS: 12.5000 mg | ORAL_TABLET | Freq: Every day | ORAL | 1 refills | Status: DC

## 2017-06-16 MED ORDER — METOPROLOL SUCCINATE 25 MG PO CS24: 25.0000 mg | EXTENDED_RELEASE_CAPSULE | Freq: Every day | ORAL | 1 refills | Status: DC

## 2017-06-16 NOTE — Telephone Encounter (Signed)
Phar called that metoprolol er 25/12.5 is backorder as per heather send to separate med metoprolol sucinate 25 and hctz12.5 and pt aware

## 2017-06-22 ENCOUNTER — Other Ambulatory Visit: Payer: Self-pay | Admitting: Internal Medicine

## 2017-07-20 ENCOUNTER — Telehealth: Payer: Self-pay | Admitting: Nurse Practitioner

## 2017-07-20 NOTE — Telephone Encounter (Signed)
Yes. Maria Sanchez should be seen, however, I saw her once, then Dr. Humphrey Rolls followed her up for this afterward. May Maria Sanchez could be scheduled for tomorrow?

## 2017-07-21 ENCOUNTER — Ambulatory Visit (INDEPENDENT_AMBULATORY_CARE_PROVIDER_SITE_OTHER): Payer: Medicare Other | Admitting: Internal Medicine

## 2017-07-21 ENCOUNTER — Encounter: Payer: Self-pay | Admitting: Internal Medicine

## 2017-07-21 VITALS — BP 147/61 | HR 74 | Resp 16 | Ht 62.0 in | Wt 157.0 lb

## 2017-07-21 DIAGNOSIS — R443 Hallucinations, unspecified: Secondary | ICD-10-CM | POA: Diagnosis not present

## 2017-07-21 DIAGNOSIS — R300 Dysuria: Secondary | ICD-10-CM

## 2017-07-21 DIAGNOSIS — E119 Type 2 diabetes mellitus without complications: Secondary | ICD-10-CM

## 2017-07-21 DIAGNOSIS — G4701 Insomnia due to medical condition: Secondary | ICD-10-CM

## 2017-07-21 DIAGNOSIS — E538 Deficiency of other specified B group vitamins: Secondary | ICD-10-CM | POA: Diagnosis not present

## 2017-07-21 LAB — POCT URINALYSIS DIPSTICK
BILIRUBIN UA: NEGATIVE
Blood, UA: NEGATIVE
GLUCOSE UA: NEGATIVE
KETONES UA: NEGATIVE
NITRITE UA: POSITIVE
SPEC GRAV UA: 1.015 (ref 1.010–1.025)
Urobilinogen, UA: 0.2 E.U./dL
pH, UA: 6 (ref 5.0–8.0)

## 2017-07-21 LAB — POCT GLYCOSYLATED HEMOGLOBIN (HGB A1C): HEMOGLOBIN A1C: 5.5

## 2017-07-21 MED ORDER — CYANOCOBALAMIN 1000 MCG/ML IJ SOLN
1000.0000 ug | Freq: Once | INTRAMUSCULAR | Status: AC
  Administered 2017-07-21: 1000 ug via INTRAMUSCULAR

## 2017-07-21 MED ORDER — CIPROFLOXACIN HCL 500 MG PO TABS: 500.0000 mg | ORAL_TABLET | Freq: Two times a day (BID) | ORAL | 0 refills | Status: DC

## 2017-07-21 NOTE — Progress Notes (Signed)
[facility] [place], [place] [address]  Internal MEDICINE  Office Visit Note  Patient Name: Maria Sanchez  [id number]  [id number]  Date of Service: 07/22/2017  Chief Complaint  Patient presents with  . Hallucinations    has not had any in six months until Thursday of last week  . Hypertension  . Allergic Rhinitis   . Dementia    Hypertension  This is a chronic problem. The current episode started more than 1 year ago. Pertinent negatives include no chest pain, neck pain, palpitations or shortness of breath. The current treatment provides significant improvement. There are no compliance problems.  Hypertensive end-organ damage includes kidney disease.  Other  This is a recurrent (She had visual hallucination in the past, was started on Seroquesl with improvement. However last week pt went without sleeping for few days. She has no memory problems per daughter ) problem. Pertinent negatives include no abdominal pain, arthralgias, chest pain, chills, congestion, coughing, fatigue, joint swelling, nausea, neck pain, numbness, rash, sore throat or vomiting.    Pt is here for routine follow up for other medical problems including dm, htn and CKD. According to her daughter she stopped taking her B12 injections    Current Medication: Outpatient Encounter Medications as of 07/21/2017  Medication Sig  . allopurinol (ZYLOPRIM) 100 MG tablet TAKE 2 TABLETS BY MOUTH AT BEDTIME AS NEEDED FOR GOUT  . ALPRAZolam (XANAX) 0.25 MG tablet Take 0.25 mg by mouth daily as needed (for nerves).   Marland Kitchen amLODipine (NORVASC) 5 MG tablet Take 5 mg daily by mouth.  Marland Kitchen aspirin EC 81 MG tablet Take 81 mg daily by mouth.  . escitalopram (LEXAPRO) 10 MG tablet Take 10 mg by mouth daily with supper.   . hydrALAZINE (APRESOLINE) 25 MG tablet TAKE 1 TABLET BY MOUTH 3 TIMES A DAY FOR BLOOD PRESSURE  . hydrochlorothiazide (HYDRODIURIL) 12.5 MG tablet Take 1 tablet (12.5 mg total) by mouth daily.   Marland Kitchen levothyroxine (SYNTHROID, LEVOTHROID) 112 MCG tablet Take 112 mcg daily before breakfast by mouth.  . Metoprolol Succinate 25 MG CS24 Take 25 mg by mouth daily.  . QUEtiapine (SEROQUEL) 25 MG tablet Take 1 tablet (25 mg total) by mouth at bedtime. Take 1/2 to 1 tablet by mouth at bedtime as needed for sleep/anxiety  . simvastatin (ZOCOR) 20 MG tablet Take 20 mg by mouth at bedtime.   . traMADol (ULTRAM) 50 MG tablet Take by mouth 4 (four) times daily as needed.   . vitamin B-12 (CYANOCOBALAMIN) 1000 MCG tablet Take 1,000 mcg by mouth daily.  . ciprofloxacin (CIPRO) 500 MG tablet Take 1 tablet (500 mg total) by mouth 2 (two) times daily.  . [EXPIRED] cyanocobalamin ((VITAMIN B-12)) injection 1,000 mcg    No facility-administered encounter medications on file as of 07/21/2017.     Surgical History: Past Surgical History:  Procedure Laterality Date  . ABDOMINAL HYSTERECTOMY    . BREAST SURGERY     benign  . CHOLECYSTECTOMY    . CORONARY ARTERY BYPASS GRAFT    . exicision salivary gland    . MITRAL VALVE REPLACEMENT ([facility]VR)/CORONARY ARTERY BYPASS GRAFTING (CABG)    . posterolateral fusion    . stent placed in kidney Right   . thumb arthroplasty  06/20/2005    Medical History: Past Medical History:  Diagnosis Date  . Arthritis   . CAD (coronary artery disease)   . Diabetes mellitus without complication ([place])   . Hyperlipidemia   . Hypertension   .  Renal artery stenosis ([place])   . Thyroid disease     Family History: Family History  Problem Relation Age of Onset  . Colon cancer Mother   . Breast cancer Sister   . Colon cancer Brother     Social History   Socioeconomic History  . Marital status: Widowed    Spouse name: Not on file  . Number of children: Not on file  . Years of education: Not on file  . Highest education level: Not on file  Occupational History  . Not on file  Social Needs  . Financial resource strain: Not on file  . Food insecurity:    Worry: Not  on file    Inability: Not on file  . Transportation needs:    Medical: Not on file    Non-medical: Not on file  Tobacco Use  . Smoking status: Never Smoker  . Smokeless tobacco: Never Used  Substance and Sexual Activity  . Alcohol use: No    Frequency: Never  . Drug use: No  . Sexual activity: Never  Lifestyle  . Physical activity:    Days per week: Not on file    Minutes per session: Not on file  . Stress: Not on file  Relationships  . Social connections:    Talks on phone: Not on file    Gets together: Not on file    Attends religious service: Not on file    Active member of club or organization: Not on file    Attends meetings of clubs or organizations: Not on file    Relationship status: Not on file  . Intimate partner violence:    Fear of current or ex partner: Not on file    Emotionally abused: Not on file    Physically abused: Not on file    Forced sexual activity: Not on file  Other Topics Concern  . Not on file  Social History Narrative  . Not on file   Review of Systems  Constitutional: Negative for chills, fatigue and unexpected weight change.  HENT: Positive for postnasal drip. Negative for congestion, rhinorrhea, sneezing and sore throat.   Eyes: Negative for redness.  Respiratory: Negative for cough, chest tightness and shortness of breath.   Cardiovascular: Negative for chest pain and palpitations.  Gastrointestinal: Negative for abdominal pain, constipation, diarrhea, nausea and vomiting.  Genitourinary: Negative for dysuria and frequency.  Musculoskeletal: Negative for arthralgias, back pain, joint swelling and neck pain.  Skin: Negative for rash.  Neurological: Negative.  Negative for tremors and numbness.  Hematological: Negative for adenopathy. Does not bruise/bleed easily.  Psychiatric/Behavioral: Negative for behavioral problems (Depression), sleep disturbance and suicidal ideas. The patient is not nervous/anxious.     Vital Signs: BP (!)  147/61 (BP Location: Left Arm, Patient Position: Sitting, Cuff Size: Normal)   Pulse 74   Resp 16   Ht 5\' 2"  (1.575 m)   Wt 157 lb (71.2 kg)   SpO2 96%   BMI 28.72 kg/m    Physical Exam  Constitutional: She is oriented to person, place, and time. She appears well-developed and well-nourished. No distress.  HENT:  Head: Normocephalic and atraumatic.  Mouth/Throat: Oropharynx is clear and moist. No oropharyngeal exudate.  Eyes: Pupils are equal, round, and reactive to light. EOM are normal.  Neck: Normal range of motion. Neck supple. No JVD present. No tracheal deviation present. No thyromegaly present.  Cardiovascular: Normal rate, regular rhythm and normal heart sounds. Exam reveals no gallop and no friction  rub.  No murmur heard. Pulmonary/Chest: Effort normal. No respiratory distress. She has no wheezes. She has no rales. She exhibits no tenderness.  Abdominal: Soft. Bowel sounds are normal.  Musculoskeletal: Normal range of motion.  Lymphadenopathy:    She has no cervical adenopathy.  Neurological: She is alert and oriented to person, place, and time. No cranial nerve deficit.  Skin: Skin is warm and dry. She is not diaphoretic.  Psychiatric: She has a normal mood and affect. Her behavior is normal. Judgment and thought content normal.    Assessment/Plan: 1. Hallucinations, unspecified - Increase Aeroquel to 25 mg po qhs. Hold Tramadol for now  - Basic metabolic panel - CBC With Differential - 2. Insomnia due to medical condition - Increase Seroquel to 25 mg po qhs. Might need further work up   3. Dysuria - POCT urinalysis dipstick - CULTURE, URINE COMPREHENSIVE - ciprofloxacin (CIPRO) 500 MG tablet; Take 1 tablet (500 mg total) by mouth 2 (two) times daily.  Dispense: 20 tablet; Refill: 0  4. Diabetes mellitus without complication (HCC) - POCT HgB A1C  5. B12 deficiency - B12 and Folate Panel - cyanocobalamin ((VITAMIN B-12)) injection 1,000 mcg  General  Counseling: Maria Sanchez understanding of the findings of todays visit and agrees with plan of treatment. I have discussed any further diagnostic evaluation that may be needed or ordered today. We also reviewed her medications today. she has been encouraged to call the office with any questions or concerns that should arise related to todays visit.    Orders Placed This Encounter  Procedures  . CULTURE, URINE COMPREHENSIVE  . B12 and Folate Panel  . Basic metabolic panel  . CBC With Differential  . POCT HgB A1C  . POCT urinalysis dipstick    Meds ordered this encounter  Medications  . cyanocobalamin ((VITAMIN B-12)) injection 1,000 mcg  . ciprofloxacin (CIPRO) 500 MG tablet    Sig: Take 1 tablet (500 mg total) by mouth 2 (two) times daily.    Dispense:  20 tablet    Refill:  0    Time spent:25 Minutes  Dr Lavera Guise Internal medicine

## 2017-07-22 ENCOUNTER — Encounter: Payer: Self-pay | Admitting: Internal Medicine

## 2017-07-22 LAB — BASIC METABOLIC PANEL
BUN / CREAT RATIO: 17 (ref 12–28)
BUN: 26 mg/dL (ref 8–27)
CALCIUM: 8.9 mg/dL (ref 8.7–10.3)
CO2: 25 mmol/L (ref 20–29)
CREATININE: 1.5 mg/dL — AB (ref 0.57–1.00)
Chloride: 101 mmol/L (ref 96–106)
GFR, EST AFRICAN AMERICAN: 37 mL/min/{1.73_m2} — AB (ref >59)
GFR, EST NON AFRICAN AMERICAN: 32 mL/min/{1.73_m2} — AB (ref >59)
Glucose: 93 mg/dL (ref 65–99)
Potassium: 4 mmol/L (ref 3.5–5.2)
Sodium: 142 mmol/L (ref 134–144)

## 2017-07-22 LAB — CBC WITH DIFFERENTIAL
BASOS: 0 %
Basophils Absolute: 0 10*3/uL (ref 0.0–0.2)
EOS (ABSOLUTE): 0.2 10*3/uL (ref 0.0–0.4)
Eos: 2 %
HEMOGLOBIN: 12 g/dL (ref 11.1–15.9)
Hematocrit: 36.3 % (ref 34.0–46.6)
IMMATURE GRANS (ABS): 0 10*3/uL (ref 0.0–0.1)
Immature Granulocytes: 0 %
LYMPHS: 28 %
Lymphocytes Absolute: 2.8 10*3/uL (ref 0.7–3.1)
MCH: 29.3 pg (ref 26.6–33.0)
MCHC: 33.1 g/dL (ref 31.5–35.7)
MCV: 89 fL (ref 79–97)
Monocytes Absolute: 0.9 10*3/uL (ref 0.1–0.9)
Monocytes: 9 %
NEUTROS ABS: 6.3 10*3/uL (ref 1.4–7.0)
Neutrophils: 61 %
RBC: 4.09 x10E6/uL (ref 3.77–5.28)
RDW: 14.6 % (ref 12.3–15.4)
WBC: 10.2 10*3/uL (ref 3.4–10.8)

## 2017-07-22 LAB — B12 AND FOLATE PANEL: FOLATE: 17.5 ng/mL (ref >3.0)

## 2017-07-23 DIAGNOSIS — I10 Essential (primary) hypertension: Secondary | ICD-10-CM | POA: Diagnosis not present

## 2017-07-23 DIAGNOSIS — E782 Mixed hyperlipidemia: Secondary | ICD-10-CM | POA: Diagnosis not present

## 2017-07-23 DIAGNOSIS — I25810 Atherosclerosis of coronary artery bypass graft(s) without angina pectoris: Secondary | ICD-10-CM | POA: Diagnosis not present

## 2017-07-26 LAB — CULTURE, URINE COMPREHENSIVE

## 2017-07-27 ENCOUNTER — Other Ambulatory Visit: Payer: Self-pay | Admitting: Internal Medicine

## 2017-07-27 NOTE — Telephone Encounter (Signed)
Can you send this prescription patient was here on 07/14/17

## 2017-07-28 ENCOUNTER — Other Ambulatory Visit: Payer: Self-pay | Admitting: Internal Medicine

## 2017-07-28 MED ORDER — ALPRAZOLAM 0.25 MG PO TABS: 0.2500 mg | ORAL_TABLET | Freq: Every day | ORAL | 5 refills | Status: DC | PRN

## 2017-08-03 NOTE — Telephone Encounter (Signed)
This was done.

## 2017-08-11 ENCOUNTER — Other Ambulatory Visit: Payer: Self-pay | Admitting: Internal Medicine

## 2017-08-12 ENCOUNTER — Ambulatory Visit (INDEPENDENT_AMBULATORY_CARE_PROVIDER_SITE_OTHER): Payer: Medicare Other

## 2017-08-12 ENCOUNTER — Other Ambulatory Visit: Payer: Self-pay

## 2017-08-12 DIAGNOSIS — E538 Deficiency of other specified B group vitamins: Secondary | ICD-10-CM | POA: Diagnosis not present

## 2017-08-12 DIAGNOSIS — R300 Dysuria: Secondary | ICD-10-CM

## 2017-08-12 MED ORDER — CYANOCOBALAMIN 1000 MCG/ML IJ SOLN
1000.0000 ug | Freq: Once | INTRAMUSCULAR | Status: AC
  Administered 2017-08-12: 1000 ug via INTRAMUSCULAR

## 2017-08-12 MED ORDER — CIPROFLOXACIN HCL 500 MG PO TABS: 500.0000 mg | ORAL_TABLET | Freq: Two times a day (BID) | ORAL | 0 refills | Status: DC

## 2017-08-12 NOTE — Telephone Encounter (Signed)
Is this ok ? Patient last seen on 07/21/17

## 2017-08-12 NOTE — Progress Notes (Signed)
X 10 days # 20

## 2017-08-12 NOTE — Progress Notes (Signed)
Have we treated this.Marland KitchenMarland Kitchen

## 2017-08-12 NOTE — Progress Notes (Signed)
Cipro 500 mg po bid x 20

## 2017-08-12 NOTE — Telephone Encounter (Signed)
lmom to pt daughter pt had uti we send cipro for 10 days

## 2017-08-12 NOTE — Telephone Encounter (Signed)
Can u see if it was done

## 2017-08-13 ENCOUNTER — Ambulatory Visit: Payer: Self-pay

## 2017-09-03 ENCOUNTER — Encounter: Payer: Self-pay | Admitting: Nurse Practitioner

## 2017-09-03 ENCOUNTER — Ambulatory Visit (INDEPENDENT_AMBULATORY_CARE_PROVIDER_SITE_OTHER): Payer: Medicare Other | Admitting: Nurse Practitioner

## 2017-09-03 VITALS — BP 128/64 | HR 60 | Resp 16 | Ht 62.0 in | Wt 157.0 lb

## 2017-09-03 DIAGNOSIS — E538 Deficiency of other specified B group vitamins: Secondary | ICD-10-CM | POA: Diagnosis not present

## 2017-09-03 DIAGNOSIS — E039 Hypothyroidism, unspecified: Secondary | ICD-10-CM | POA: Diagnosis not present

## 2017-09-03 DIAGNOSIS — I10 Essential (primary) hypertension: Secondary | ICD-10-CM | POA: Diagnosis not present

## 2017-09-03 DIAGNOSIS — F0281 Dementia in other diseases classified elsewhere with behavioral disturbance: Secondary | ICD-10-CM

## 2017-09-03 MED ORDER — CYANOCOBALAMIN 1000 MCG/ML IJ SOLN
1000.0000 ug | Freq: Once | INTRAMUSCULAR | Status: AC
  Administered 2017-09-03: 1000 ug via INTRAMUSCULAR

## 2017-09-03 NOTE — Progress Notes (Signed)
[facility] [place], [place] [address]  Internal MEDICINE  Office Visit Note  Patient Name: Maria Sanchez  [id number]  [id number]  Date of Service: 09/03/2017   Pt is here for routine follow up.   Chief Complaint  Patient presents with  . Hypertension    Hypertension  This is a chronic problem. The current episode started more than 1 year ago. The problem is unchanged. The problem is controlled. Pertinent negatives include no chest pain, headaches, neck pain, palpitations or shortness of breath. Agents associated with hypertension include thyroid hormones. Risk factors for coronary artery disease include dyslipidemia and stress. Past treatments include calcium channel blockers and diuretics. The current treatment provides moderate improvement. Compliance problems include exercise.        Current Medication: Outpatient Encounter Medications as of 09/03/2017  Medication Sig  . allopurinol (ZYLOPRIM) 100 MG tablet TAKE 2 TABLETS BY MOUTH AT BEDTIME AS NEEDED FOR GOUT  . ALPRAZolam (XANAX) 0.25 MG tablet Take 1 tablet (0.25 mg total) by mouth daily as needed (for nerves).  Marland Kitchen amLODipine (NORVASC) 5 MG tablet Take 5 mg daily by mouth.  Marland Kitchen aspirin EC 81 MG tablet Take 81 mg daily by mouth.  . ciprofloxacin (CIPRO) 500 MG tablet Take 1 tablet (500 mg total) by mouth 2 (two) times daily.  Marland Kitchen escitalopram (LEXAPRO) 10 MG tablet Take 10 mg by mouth daily with supper.   . hydrALAZINE (APRESOLINE) 25 MG tablet TAKE 1 TABLET BY MOUTH 3 TIMES A DAY FOR BLOOD PRESSURE  . hydrochlorothiazide (HYDRODIURIL) 12.5 MG tablet Take 1 tablet (12.5 mg total) by mouth daily.  Marland Kitchen levothyroxine (SYNTHROID, LEVOTHROID) 112 MCG tablet Take 112 mcg daily before breakfast by mouth.  . Metoprolol Succinate 25 MG CS24 Take 25 mg by mouth daily.  . QUEtiapine (SEROQUEL) 25 MG tablet Take 1 tablet (25 mg total) by mouth at bedtime. Take 1/2 to 1 tablet by mouth at bedtime as needed for  sleep/anxiety  . simvastatin (ZOCOR) 20 MG tablet Take 20 mg by mouth at bedtime.   . traMADol (ULTRAM) 50 MG tablet TAKE 1 TABLET BY MOUTH FOUR TIMES A DAY AS NEEDED FOR PAIN  . vitamin B-12 (CYANOCOBALAMIN) 1000 MCG tablet Take 1,000 mcg by mouth daily.  . [EXPIRED] cyanocobalamin ((VITAMIN B-12)) injection 1,000 mcg    No facility-administered encounter medications on file as of 09/03/2017.     Surgical History: Past Surgical History:  Procedure Laterality Date  . ABDOMINAL HYSTERECTOMY    . BREAST SURGERY     benign  . CHOLECYSTECTOMY    . CORONARY ARTERY BYPASS GRAFT    . exicision salivary gland    . MITRAL VALVE REPLACEMENT (MVR)/CORONARY ARTERY BYPASS GRAFTING (CABG)    . posterolateral fusion    . stent placed in kidney Right   . thumb arthroplasty  06/20/2005    Medical History: Past Medical History:  Diagnosis Date  . Arthritis   . CAD (coronary artery disease)   . Diabetes mellitus without complication ([place])   . Hyperlipidemia   . Hypertension   . Renal artery stenosis ([place])   . Thyroid disease     Family History: Family History  Problem Relation Age of Onset  . Colon cancer Mother   . Breast cancer Sister   . Colon cancer Brother     Social History   Socioeconomic History  . Marital status: Widowed    Spouse name: Not on file  . Number of children: Not on  file  . Years of education: Not on file  . Highest education level: Not on file  Occupational History  . Not on file  Social Needs  . Financial resource strain: Not on file  . Food insecurity:    Worry: Not on file    Inability: Not on file  . Transportation needs:    Medical: Not on file    Non-medical: Not on file  Tobacco Use  . Smoking status: Never Smoker  . Smokeless tobacco: Never Used  Substance and Sexual Activity  . Alcohol use: No    Frequency: Never  . Drug use: No  . Sexual activity: Never  Lifestyle  . Physical activity:    Days per week: Not on file    Minutes per  session: Not on file  . Stress: Not on file  Relationships  . Social connections:    Talks on phone: Not on file    Gets together: Not on file    Attends religious service: Not on file    Active member of club or organization: Not on file    Attends meetings of clubs or organizations: Not on file    Relationship status: Not on file  . Intimate partner violence:    Fear of current or ex partner: Not on file    Emotionally abused: Not on file    Physically abused: Not on file    Forced sexual activity: Not on file  Other Topics Concern  . Not on file  Social History Narrative  . Not on file      Review of Systems  Constitutional: Positive for fatigue. Negative for activity change, chills and unexpected weight change.  HENT: Negative for congestion, postnasal drip, rhinorrhea, sneezing and sore throat.   Eyes: Negative for redness.  Respiratory: Negative for cough, chest tightness, shortness of breath and wheezing.   Cardiovascular: Negative for chest pain and palpitations.  Gastrointestinal: Negative for abdominal pain, constipation, diarrhea, nausea and vomiting.  Endocrine: Negative for cold intolerance, heat intolerance, polydipsia, polyphagia and polyuria.  Genitourinary: Negative for dysuria and frequency.  Musculoskeletal: Positive for arthralgias. Negative for back pain, joint swelling and neck pain.       States that she has minimal right knee pain when she extends it fully.   Skin: Negative for rash.  Allergic/Immunologic: Positive for environmental allergies.  Neurological: Negative for dizziness, tremors, numbness and headaches.  Hematological: Negative for adenopathy. Does not bruise/bleed easily.  Psychiatric/Behavioral: Positive for dysphoric mood and hallucinations. Negative for behavioral problems (Depression), sleep disturbance and suicidal ideas. The patient is not nervous/anxious.        Improved and stable on current medications.     Vital Signs: BP 128/64    Pulse 60   Resp 16   Ht 5\' 2"  (1.575 m)   Wt 157 lb (71.2 kg)   SpO2 97%   BMI 28.72 kg/m    Physical Exam  Constitutional: She is oriented to person, place, and time. She appears well-developed and well-nourished.  HENT:  Head: Normocephalic and atraumatic.  Nose: Nose normal.  Eyes: Pupils are equal, round, and reactive to light.  Neck: Normal range of motion. Neck supple. Carotid bruit is not present. No thyromegaly present.  Cardiovascular: Normal rate and regular rhythm.  Murmur heard.  Systolic murmur is present with a grade of 2/6. Pulmonary/Chest: Effort normal and breath sounds normal. She has no wheezes. She has no rales.  Abdominal: Soft. Bowel sounds are normal. There is no tenderness.  Musculoskeletal: Normal range of motion.  Neurological: She is alert and oriented to person, place, and time.  Patient is at her neurological baseline  Skin: Skin is warm and dry.  Psychiatric: She has a normal mood and affect. Her behavior is normal. Judgment and thought content normal.  Nursing note and vitals reviewed.   Assessment/Plan: 1. Essential (primary) hypertension Stable. Continue bp medication as prescribed.   2. Dementia in other diseases classified elsewhere with behavioral disturbance Stable. Monitor closely  3. B12 deficiency Continue monthly b12 injections.  - cyanocobalamin ((VITAMIN B-12)) injection 1,000 mcg  4. Acquired hypothyroidism Stable. Continue levothyroxine as prescribed.   General Counseling: cherylann hobday understanding of the findings of todays visit and agrees with plan of treatment. I have discussed any further diagnostic evaluation that may be needed or ordered today. We also reviewed her medications today. she has been encouraged to call the office with any questions or concerns that should arise related to todays visit.  This patient was seen by Leretha Pol, FNP- C in Collaboration with Dr Lavera Guise as a part of collaborative  care agreement  Meds ordered this encounter  Medications  . cyanocobalamin ((VITAMIN B-12)) injection 1,000 mcg    Time spent: 70 Minutes     Dr Lavera Guise Internal medicine

## 2017-10-06 ENCOUNTER — Ambulatory Visit (INDEPENDENT_AMBULATORY_CARE_PROVIDER_SITE_OTHER): Payer: Medicare Other

## 2017-10-06 DIAGNOSIS — E538 Deficiency of other specified B group vitamins: Secondary | ICD-10-CM | POA: Diagnosis not present

## 2017-10-06 MED ORDER — CYANOCOBALAMIN 1000 MCG/ML IJ SOLN
1000.0000 ug | Freq: Once | INTRAMUSCULAR | Status: AC
  Administered 2017-10-06: 1000 ug via INTRAMUSCULAR

## 2017-10-07 ENCOUNTER — Ambulatory Visit: Payer: Self-pay

## 2017-10-10 ENCOUNTER — Other Ambulatory Visit: Payer: Self-pay | Admitting: Internal Medicine

## 2017-10-20 ENCOUNTER — Ambulatory Visit (INDEPENDENT_AMBULATORY_CARE_PROVIDER_SITE_OTHER): Payer: Medicare Other | Admitting: Internal Medicine

## 2017-10-20 ENCOUNTER — Encounter: Payer: Self-pay | Admitting: Internal Medicine

## 2017-10-20 ENCOUNTER — Other Ambulatory Visit: Payer: Self-pay

## 2017-10-20 VITALS — BP 160/80 | HR 74 | Resp 16 | Ht 62.0 in | Wt 158.2 lb

## 2017-10-20 DIAGNOSIS — R410 Disorientation, unspecified: Secondary | ICD-10-CM

## 2017-10-20 DIAGNOSIS — I10 Essential (primary) hypertension: Secondary | ICD-10-CM | POA: Diagnosis not present

## 2017-10-20 DIAGNOSIS — R300 Dysuria: Secondary | ICD-10-CM

## 2017-10-20 DIAGNOSIS — M150 Primary generalized (osteo)arthritis: Secondary | ICD-10-CM

## 2017-10-20 LAB — POCT URINALYSIS DIPSTICK
Bilirubin, UA: NEGATIVE
Glucose, UA: NEGATIVE
Ketones, UA: NEGATIVE
LEUKOCYTES UA: NEGATIVE
NITRITE UA: NEGATIVE
PH UA: 6 (ref 5.0–8.0)
PROTEIN UA: POSITIVE — AB
RBC UA: NEGATIVE
SPEC GRAV UA: 1.01 (ref 1.010–1.025)
UROBILINOGEN UA: 0.2 U/dL

## 2017-10-20 NOTE — Progress Notes (Signed)
[facility] [place], [place] [address]  Internal MEDICINE  Office Visit Note  Patient Name: Maria Sanchez  [id number]  [id number]  Date of Service: 11/04/2017  Chief Complaint  Patient presents with  . Urinary Tract Infection  . Osteoarthritis  . Altered Mental Status    HPI  Pt is here for routine follow up. Daughter in room with her, pt has been having periods of confusion, she becomes dellusional, irritable and seeing people who are not there.  She is sleeping 12-13 hours on seroquel. She is also on tramadol for chronic arthritis and back pain.  Pt was treated for UTI 2 months ago.    Current Medication: Outpatient Encounter Medications as of 10/20/2017  Medication Sig  . allopurinol (ZYLOPRIM) 100 MG tablet TAKE 2 TABLETS BY MOUTH AT BEDTIME AS NEEDED FOR GOUT  . ALPRAZolam (XANAX) 0.25 MG tablet Take 1 tablet (0.25 mg total) by mouth daily as needed (for nerves).  Marland Kitchen amLODipine (NORVASC) 5 MG tablet Take 5 mg daily by mouth.  Marland Kitchen aspirin EC 81 MG tablet Take 81 mg daily by mouth.  . escitalopram (LEXAPRO) 10 MG tablet TAKE 1 TABLET BY MOUTH EVERY DAY WITH SUPPER  . hydrALAZINE (APRESOLINE) 25 MG tablet TAKE 1 TABLET BY MOUTH 3 TIMES A DAY FOR BLOOD PRESSURE  . levothyroxine (SYNTHROID, LEVOTHROID) 112 MCG tablet Take 112 mcg daily before breakfast by mouth.  . Metoprolol Succinate 25 MG CS24 Take 25 mg by mouth daily.  . QUEtiapine (SEROQUEL) 25 MG tablet Take 1 tablet (25 mg total) by mouth at bedtime. Take 1/2 to 1 tablet by mouth at bedtime as needed for sleep/anxiety  . simvastatin (ZOCOR) 20 MG tablet Take 20 mg by mouth at bedtime.   . traMADol (ULTRAM) 50 MG tablet TAKE 1 TABLET BY MOUTH FOUR TIMES A DAY AS NEEDED FOR PAIN  . ciprofloxacin (CIPRO) 500 MG tablet Take 1 tablet (500 mg total) by mouth 2 (two) times daily. (Patient not taking: Reported on 10/20/2017)  . hydrochlorothiazide (HYDRODIURIL) 12.5 MG tablet Take 1 tablet (12.5 mg total) by  mouth daily.  . vitamin B-12 (CYANOCOBALAMIN) 1000 MCG tablet Take 1,000 mcg by mouth daily.   No facility-administered encounter medications on file as of 10/20/2017.     Surgical History: Past Surgical History:  Procedure Laterality Date  . ABDOMINAL HYSTERECTOMY    . BREAST SURGERY     benign  . CHOLECYSTECTOMY    . CORONARY ARTERY BYPASS GRAFT    . exicision salivary gland    . MITRAL VALVE REPLACEMENT (MVR)/CORONARY ARTERY BYPASS GRAFTING (CABG)    . posterolateral fusion    . stent placed in kidney Right   . thumb arthroplasty  06/20/2005    Medical History: Past Medical History:  Diagnosis Date  . Arthritis   . CAD (coronary artery disease)   . Diabetes mellitus without complication ([place])   . Hyperlipidemia   . Hypertension   . Renal artery stenosis ([place])   . Thyroid disease     Family History: Family History  Problem Relation Age of Onset  . Colon cancer Mother   . Breast cancer Sister   . Colon cancer Brother     Social History   Socioeconomic History  . Marital status: Widowed    Spouse name: Not on file  . Number of children: Not on file  . Years of education: Not on file  . Highest education level: Not on file  Occupational History  .  Not on file  Social Needs  . Financial resource strain: Not on file  . Food insecurity:    Worry: Not on file    Inability: Not on file  . Transportation needs:    Medical: Not on file    Non-medical: Not on file  Tobacco Use  . Smoking status: Never Smoker  . Smokeless tobacco: Never Used  Substance and Sexual Activity  . Alcohol use: No    Frequency: Never  . Drug use: No  . Sexual activity: Never  Lifestyle  . Physical activity:    Days per week: Not on file    Minutes per session: Not on file  . Stress: Not on file  Relationships  . Social connections:    Talks on phone: Not on file    Gets together: Not on file    Attends religious service: Not on file    Active member of club or organization:  Not on file    Attends meetings of clubs or organizations: Not on file    Relationship status: Not on file  . Intimate partner violence:    Fear of current or ex partner: Not on file    Emotionally abused: Not on file    Physically abused: Not on file    Forced sexual activity: Not on file  Other Topics Concern  . Not on file  Social History Narrative  . Not on file      Review of Systems  Constitutional: Negative for chills, diaphoresis and fatigue.  HENT: Negative for ear pain, postnasal drip and sinus pressure.   Eyes: Negative for photophobia, discharge, redness, itching and visual disturbance.  Respiratory: Negative for cough, shortness of breath and wheezing.   Cardiovascular: Negative for chest pain, palpitations and leg swelling.  Gastrointestinal: Negative for abdominal pain, constipation, diarrhea, nausea and vomiting.  Genitourinary: Negative for dysuria and flank pain.  Musculoskeletal: Negative for arthralgias, back pain, gait problem and neck pain.  Skin: Negative for color change.  Allergic/Immunologic: Negative for environmental allergies and food allergies.  Neurological: Negative for dizziness and headaches.  Hematological: Does not bruise/bleed easily.  Psychiatric/Behavioral: Negative for agitation, behavioral problems (depression) and hallucinations.   Vital Signs: BP (!) 160/80   Pulse 74   Resp 16   Ht 5\' 2"  (1.575 m)   Wt 158 lb 3.2 oz (71.8 kg)   SpO2 97%   BMI 28.94 kg/m   Physical Exam  Constitutional: She is oriented to person, place, and time. She appears well-developed and well-nourished. No distress.  HENT:  Head: Normocephalic and atraumatic.  Mouth/Throat: Oropharynx is clear and moist. No oropharyngeal exudate.  Eyes: Pupils are equal, round, and reactive to light. EOM are normal.  Neck: Normal range of motion. Neck supple. No JVD present. No tracheal deviation present. No thyromegaly present.  Cardiovascular: Normal rate, regular  rhythm and normal heart sounds. Exam reveals no gallop and no friction rub.  No murmur heard. Pulmonary/Chest: Effort normal. No respiratory distress. She has no wheezes. She has no rales. She exhibits no tenderness.  Abdominal: Soft. Bowel sounds are normal.  Musculoskeletal: Normal range of motion.  Lymphadenopathy:    She has no cervical adenopathy.  Neurological: She is alert and oriented to person, place, and time. No cranial nerve deficit.  Difficulty transferring to table.  Girdle weakness.    Skin: Skin is warm and dry. She is not diaphoretic.  Psychiatric: She has a normal mood and affect. Her behavior is normal. Judgment and thought  content normal.   Assessment/Plan: 1. Episode of confusion Unclear eitiology however, pharmacological side effects could be associated with this.  Seroguel and tramadol stopped at this time.   - Basic metabolic panel  2. Dysuria   Repeat urine to RO UTI, Culture sent. - POCT Urinalysis Dipstick  3. Essential (primary) hypertension Slightly elevated, will monitor.  Will check BMP for CRt.   4. Primary generalized (osteo)arthritis Pt is dependant on tramadol and has difficulty getting on exam table. Girdle weakness, will hold zocor.    General Counseling: ianna salmela understanding of the findings of todays visit and agrees with plan of treatment. I have discussed any further diagnostic evaluation that may be needed or ordered today. We also reviewed her medications today. she has been encouraged to call the office with any questions or concerns that should arise related to todays visit.    Orders Placed This Encounter  Procedures  . Basic metabolic panel  . POCT Urinalysis Dipstick     Time spent:20 Minutes  Dr Lavera Guise Internal medicine

## 2017-10-21 LAB — BASIC METABOLIC PANEL
BUN / CREAT RATIO: 17 (ref 12–28)
BUN: 22 mg/dL (ref 8–27)
CO2: 24 mmol/L (ref 20–29)
CREATININE: 1.3 mg/dL — AB (ref 0.57–1.00)
Calcium: 9.2 mg/dL (ref 8.7–10.3)
Chloride: 103 mmol/L (ref 96–106)
GFR calc non Af Amer: 38 mL/min/{1.73_m2} — ABNORMAL LOW (ref >59)
GFR, EST AFRICAN AMERICAN: 44 mL/min/{1.73_m2} — AB (ref >59)
Glucose: 139 mg/dL — ABNORMAL HIGH (ref 65–99)
Potassium: 4.6 mmol/L (ref 3.5–5.2)
SODIUM: 144 mmol/L (ref 134–144)

## 2017-11-04 ENCOUNTER — Ambulatory Visit: Payer: Self-pay

## 2017-11-05 ENCOUNTER — Other Ambulatory Visit: Payer: Self-pay | Admitting: Internal Medicine

## 2017-11-19 ENCOUNTER — Other Ambulatory Visit: Payer: Self-pay | Admitting: Internal Medicine

## 2017-11-19 ENCOUNTER — Encounter: Payer: Self-pay | Admitting: Adult Health

## 2017-11-19 ENCOUNTER — Ambulatory Visit (INDEPENDENT_AMBULATORY_CARE_PROVIDER_SITE_OTHER): Payer: Medicare Other | Admitting: Adult Health

## 2017-11-19 VITALS — BP 132/72 | HR 62 | Resp 16 | Ht 62.0 in | Wt 156.4 lb

## 2017-11-19 DIAGNOSIS — I10 Essential (primary) hypertension: Secondary | ICD-10-CM | POA: Diagnosis not present

## 2017-11-19 DIAGNOSIS — E538 Deficiency of other specified B group vitamins: Secondary | ICD-10-CM | POA: Diagnosis not present

## 2017-11-19 DIAGNOSIS — R410 Disorientation, unspecified: Secondary | ICD-10-CM

## 2017-11-19 DIAGNOSIS — N183 Chronic kidney disease, stage 3 unspecified: Secondary | ICD-10-CM

## 2017-11-19 MED ORDER — PNEUMOCOCCAL 13-VAL CONJ VACC IM SUSP: 0.5000 mL | INTRAMUSCULAR | 0 refills | Status: AC

## 2017-11-19 MED ORDER — CYANOCOBALAMIN 1000 MCG/ML IJ SOLN
1000.0000 ug | Freq: Once | INTRAMUSCULAR | Status: AC
  Administered 2017-11-19: 1000 ug via INTRAMUSCULAR

## 2017-11-19 NOTE — Progress Notes (Signed)
[facility] [place], [place] [address]  Internal MEDICINE  Office Visit Note  Patient Name: Maria Sanchez  [id number]  [id number]  Date of Service: 11/19/2017  Chief Complaint  Patient presents with  . Hypertension  . Hallucinations    confusion is much better     HPI Pt here for follow up.  At last visit she was told to stop her Seroquel, tramadol and Zocor.  She has had great results.  Daughter in room with patient, reports pt is much better.  Able to get up and do things.  No long somnolent and is more steady on her feet.      Current Medication: Outpatient Encounter Medications as of 11/19/2017  Medication Sig  . allopurinol (ZYLOPRIM) 100 MG tablet TAKE 2 TABLETS BY MOUTH AT BEDTIME AS NEEDED FOR GOUT  . ALPRAZolam (XANAX) 0.25 MG tablet Take 1 tablet (0.25 mg total) by mouth daily as needed (for nerves).  Marland Kitchen amLODipine (NORVASC) 5 MG tablet TAKE 1 TABLET BY MOUTH EVERY DAY  . aspirin EC 81 MG tablet Take 81 mg daily by mouth.  . escitalopram (LEXAPRO) 10 MG tablet TAKE 1 TABLET BY MOUTH EVERY DAY WITH SUPPER  . hydrALAZINE (APRESOLINE) 25 MG tablet TAKE 1 TABLET BY MOUTH 3 TIMES A DAY FOR BLOOD PRESSURE  . hydrochlorothiazide (HYDRODIURIL) 12.5 MG tablet Take 1 tablet (12.5 mg total) by mouth daily.  Marland Kitchen levothyroxine (SYNTHROID, LEVOTHROID) 112 MCG tablet Take 112 mcg daily before breakfast by mouth.  . Metoprolol Succinate 25 MG CS24 Take 25 mg by mouth daily.  . pneumococcal 13-valent conjugate vaccine (PREVNAR 13) SUSP injection Inject 0.5 mLs into the muscle tomorrow at 10 am for 1 dose.  . vitamin B-12 (CYANOCOBALAMIN) 1000 MCG tablet Take 1,000 mcg by mouth daily.  . QUEtiapine (SEROQUEL) 25 MG tablet Take 1 tablet (25 mg total) by mouth at bedtime. Take 1/2 to 1 tablet by mouth at bedtime as needed for sleep/anxiety (Patient not taking: Reported on 11/19/2017)  . simvastatin (ZOCOR) 20 MG tablet Take 20 mg by mouth at bedtime.   . traMADol  (ULTRAM) 50 MG tablet TAKE 1 TABLET BY MOUTH FOUR TIMES A DAY AS NEEDED FOR PAIN (Patient not taking: Reported on 11/19/2017)  . [DISCONTINUED] ciprofloxacin (CIPRO) 500 MG tablet Take 1 tablet (500 mg total) by mouth 2 (two) times daily. (Patient not taking: Reported on 10/20/2017)  . [EXPIRED] cyanocobalamin ((VITAMIN B-12)) injection 1,000 mcg    No facility-administered encounter medications on file as of 11/19/2017.     Surgical History: Past Surgical History:  Procedure Laterality Date  . ABDOMINAL HYSTERECTOMY    . BREAST SURGERY     benign  . CHOLECYSTECTOMY    . CORONARY ARTERY BYPASS GRAFT    . exicision salivary gland    . MITRAL VALVE REPLACEMENT (MVR)/CORONARY ARTERY BYPASS GRAFTING (CABG)    . posterolateral fusion    . stent placed in kidney Right   . thumb arthroplasty  06/20/2005    Medical History: Past Medical History:  Diagnosis Date  . Arthritis   . CAD (coronary artery disease)   . Diabetes mellitus without complication ([place])   . Hyperlipidemia   . Hypertension   . Renal artery stenosis ([place])   . Thyroid disease     Family History: Family History  Problem Relation Age of Onset  . Colon cancer Mother   . Breast cancer Sister   . Colon cancer Brother     Social  History   Socioeconomic History  . Marital status: Widowed    Spouse name: Not on file  . Number of children: Not on file  . Years of education: Not on file  . Highest education level: Not on file  Occupational History  . Not on file  Social Needs  . Financial resource strain: Not on file  . Food insecurity:    Worry: Not on file    Inability: Not on file  . Transportation needs:    Medical: Not on file    Non-medical: Not on file  Tobacco Use  . Smoking status: Never Smoker  . Smokeless tobacco: Never Used  Substance and Sexual Activity  . Alcohol use: No    Frequency: Never  . Drug use: No  . Sexual activity: Never  Lifestyle  . Physical activity:    Days per week: Not on  file    Minutes per session: Not on file  . Stress: Not on file  Relationships  . Social connections:    Talks on phone: Not on file    Gets together: Not on file    Attends religious service: Not on file    Active member of club or organization: Not on file    Attends meetings of clubs or organizations: Not on file    Relationship status: Not on file  . Intimate partner violence:    Fear of current or ex partner: Not on file    Emotionally abused: Not on file    Physically abused: Not on file    Forced sexual activity: Not on file  Other Topics Concern  . Not on file  Social History Narrative  . Not on file      Review of Systems  Constitutional: Negative for chills, fatigue and unexpected weight change.  HENT: Negative for congestion, rhinorrhea, sneezing and sore throat.   Eyes: Negative for photophobia, pain and redness.  Respiratory: Negative for cough, chest tightness and shortness of breath.   Cardiovascular: Negative for chest pain and palpitations.  Gastrointestinal: Negative for abdominal pain, constipation, diarrhea, nausea and vomiting.  Endocrine: Negative.   Genitourinary: Negative for dysuria and frequency.  Musculoskeletal: Negative for arthralgias, back pain, joint swelling and neck pain.  Skin: Negative for rash.  Allergic/Immunologic: Negative.   Neurological: Negative for tremors and numbness.  Hematological: Negative for adenopathy. Does not bruise/bleed easily.  Psychiatric/Behavioral: Negative for behavioral problems and sleep disturbance. The patient is not nervous/anxious.     Vital Signs: BP 132/72   Pulse 62   Resp 16   Ht 5\' 2"  (1.575 m)   Wt 156 lb 6.4 oz (70.9 kg)   SpO2 96%   BMI 28.61 kg/m    Physical Exam  Constitutional: She is oriented to person, place, and time. She appears well-developed and well-nourished. No distress.  HENT:  Head: Normocephalic and atraumatic.  Mouth/Throat: Oropharynx is clear and moist. No oropharyngeal  exudate.  Eyes: Pupils are equal, round, and reactive to light. EOM are normal.  Neck: Normal range of motion. Neck supple. No JVD present. No tracheal deviation present. No thyromegaly present.  Cardiovascular: Normal rate, regular rhythm and normal heart sounds. Exam reveals no gallop and no friction rub.  No murmur heard. Pulmonary/Chest: Effort normal and breath sounds normal. No respiratory distress. She has no wheezes. She has no rales. She exhibits no tenderness.  Abdominal: Soft. There is no tenderness. There is no guarding.  Musculoskeletal: Normal range of motion.  Lymphadenopathy:  She has no cervical adenopathy.  Neurological: She is alert and oriented to person, place, and time. No cranial nerve deficit.  Skin: Skin is warm and dry. She is not diaphoretic.  Psychiatric: She has a normal mood and affect. Her behavior is normal. Judgment and thought content normal.  Nursing note and vitals reviewed.  Assessment/Plan: 1. Episode of confusion Resolved with discontinuation of Seroquel and Tramadol.   2. Essential (primary) hypertension Controlled.  Continue current regimen.   3. B12 deficiency - cyanocobalamin ((VITAMIN B-12)) injection 1,000 mcg  4. Chronic kidney disease (CKD), stage III (moderate) (HCC) Labs normalizing, will recheck at physical in 2 months.   General Counseling: lillymae duet understanding of the findings of todays visit and agrees with plan of treatment. I have discussed any further diagnostic evaluation that may be needed or ordered today. We also reviewed her medications today. she has been encouraged to call the office with any questions or concerns that should arise related to todays visit.    No orders of the defined types were placed in this encounter.   Meds ordered this encounter  Medications  . cyanocobalamin ((VITAMIN B-12)) injection 1,000 mcg    Time spent: 25  Minutes   This patient was seen by Orson Gear AGNP-C in  Collaboration with Dr Lavera Guise as a part of collaborative care agreement    Dr Lavera Guise Internal medicine

## 2017-11-19 NOTE — Patient Instructions (Signed)
Cyanocobalamin, Vitamin B12 injection What is this medicine? CYANOCOBALAMIN (sye an oh koe BAL a min) is a man made form of vitamin B12. Vitamin B12 is used in the growth of healthy blood cells, nerve cells, and proteins in the body. It also helps with the metabolism of fats and carbohydrates. This medicine is used to treat people who can not absorb vitamin B12. This medicine may be used for other purposes; ask your health care provider or pharmacist if you have questions. COMMON BRAND NAME(S): B-12 Compliance Kit, B-12 Injection Kit, Cyomin, LA-12, Nutri-Twelve, Physicians EZ Use B-12, Primabalt What should I tell my health care provider before I take this medicine? They need to know if you have any of these conditions: -kidney disease -Leber's disease -megaloblastic anemia -an unusual or allergic reaction to cyanocobalamin, cobalt, other medicines, foods, dyes, or preservatives -pregnant or trying to get pregnant -breast-feeding How should I use this medicine? This medicine is injected into a muscle or deeply under the skin. It is usually given by a health care professional in a clinic or doctor's office. However, your doctor may teach you how to inject yourself. Follow all instructions. Talk to your pediatrician regarding the use of this medicine in children. Special care may be needed. Overdosage: If you think you have taken too much of this medicine contact a poison control center or emergency room at once. NOTE: This medicine is only for you. Do not share this medicine with others. What if I miss a dose? If you are given your dose at a clinic or doctor's office, call to reschedule your appointment. If you give your own injections and you miss a dose, take it as soon as you can. If it is almost time for your next dose, take only that dose. Do not take double or extra doses. What may interact with this medicine? -colchicine -heavy alcohol intake This list may not describe all possible  interactions. Give your health care provider a list of all the medicines, herbs, non-prescription drugs, or dietary supplements you use. Also tell them if you smoke, drink alcohol, or use illegal drugs. Some items may interact with your medicine. What should I watch for while using this medicine? Visit your doctor or health care professional regularly. You may need blood work done while you are taking this medicine. You may need to follow a special diet. Talk to your doctor. Limit your alcohol intake and avoid smoking to get the best benefit. What side effects may I notice from receiving this medicine? Side effects that you should report to your doctor or health care professional as soon as possible: -allergic reactions like skin rash, itching or hives, swelling of the face, lips, or tongue -blue tint to skin -chest tightness, pain -difficulty breathing, wheezing -dizziness -red, swollen painful area on the leg Side effects that usually do not require medical attention (report to your doctor or health care professional if they continue or are bothersome): -diarrhea -headache This list may not describe all possible side effects. Call your doctor for medical advice about side effects. You may report side effects to FDA at 1-800-FDA-1088. Where should I keep my medicine? Keep out of the reach of children. Store at room temperature between 15 and 30 degrees C (59 and 85 degrees F). Protect from light. Throw away any unused medicine after the expiration date. NOTE: This sheet is a summary. It may not cover all possible information. If you have questions about this medicine, talk to your doctor, pharmacist, or   health care provider.  2018 Elsevier/Gold Standard (2007-07-26 22:10:20)  

## 2017-11-19 NOTE — Progress Notes (Signed)
b

## 2017-11-30 ENCOUNTER — Encounter: Payer: Self-pay | Admitting: Adult Health

## 2017-12-07 ENCOUNTER — Other Ambulatory Visit: Payer: Self-pay | Admitting: Adult Health

## 2017-12-07 MED ORDER — METOPROLOL SUCCINATE 25 MG PO CS24: 25.0000 mg | EXTENDED_RELEASE_CAPSULE | Freq: Every day | ORAL | 1 refills | Status: DC

## 2017-12-08 ENCOUNTER — Other Ambulatory Visit: Payer: Self-pay

## 2017-12-08 MED ORDER — METOPROLOL SUCCINATE 25 MG PO CS24: 25.0000 mg | EXTENDED_RELEASE_CAPSULE | Freq: Every day | ORAL | 1 refills | Status: DC

## 2017-12-24 ENCOUNTER — Other Ambulatory Visit: Payer: Self-pay

## 2017-12-24 MED ORDER — METOPROLOL SUCCINATE 25 MG PO CS24: 25.0000 mg | EXTENDED_RELEASE_CAPSULE | Freq: Every day | ORAL | 1 refills | Status: DC

## 2017-12-25 ENCOUNTER — Other Ambulatory Visit: Payer: Self-pay

## 2017-12-25 MED ORDER — METOPROLOL SUCCINATE ER 25 MG PO TB24: 25.0000 mg | ORAL_TABLET | Freq: Every day | ORAL | 0 refills | Status: DC

## 2018-01-04 ENCOUNTER — Ambulatory Visit: Payer: Self-pay | Admitting: Nurse Practitioner

## 2018-01-08 ENCOUNTER — Ambulatory Visit (INDEPENDENT_AMBULATORY_CARE_PROVIDER_SITE_OTHER): Payer: Medicare Other | Admitting: Nurse Practitioner

## 2018-01-08 ENCOUNTER — Encounter: Payer: Self-pay | Admitting: Nurse Practitioner

## 2018-01-08 VITALS — BP 154/68 | HR 63 | Resp 16 | Ht 62.0 in | Wt 159.8 lb

## 2018-01-08 DIAGNOSIS — Z0001 Encounter for general adult medical examination with abnormal findings: Secondary | ICD-10-CM | POA: Diagnosis not present

## 2018-01-08 DIAGNOSIS — E538 Deficiency of other specified B group vitamins: Secondary | ICD-10-CM

## 2018-01-08 DIAGNOSIS — E039 Hypothyroidism, unspecified: Secondary | ICD-10-CM

## 2018-01-08 DIAGNOSIS — R300 Dysuria: Secondary | ICD-10-CM

## 2018-01-08 DIAGNOSIS — I10 Essential (primary) hypertension: Secondary | ICD-10-CM

## 2018-01-08 DIAGNOSIS — Z23 Encounter for immunization: Secondary | ICD-10-CM

## 2018-01-08 MED ORDER — CYANOCOBALAMIN 1000 MCG/ML IJ SOLN
1000.0000 ug | Freq: Once | INTRAMUSCULAR | Status: AC
  Administered 2018-01-08: 1000 ug via INTRAMUSCULAR

## 2018-01-08 NOTE — Progress Notes (Signed)
[facility] [place], [place] [address]  Internal MEDICINE  Office Visit Note  Patient Name: Maria Sanchez  [id number]  [id number]  Date of Service: 01/20/2018   Pt is here for routine health maintenance examination  Chief Complaint  Patient presents with  . Medicare Wellness    4 month wellness visit  . Injections    B12     Hypertension  This is a chronic problem. The current episode started more than 1 year ago. The problem is unchanged. The problem is controlled. Pertinent negatives include no chest pain, headaches, neck pain, palpitations or shortness of breath. Agents associated with hypertension include thyroid hormones. Risk factors for coronary artery disease include dyslipidemia and stress. Past treatments include calcium channel blockers and diuretics. The current treatment provides moderate improvement. Compliance problems include exercise.      Current Medication: Outpatient Encounter Medications as of 01/08/2018  Medication Sig  . allopurinol (ZYLOPRIM) 100 MG tablet TAKE 2 TABLETS BY MOUTH AT BEDTIME AS NEEDED FOR GOUT  . ALPRAZolam (XANAX) 0.25 MG tablet Take 1 tablet (0.25 mg total) by mouth daily as needed (for nerves).  Marland Kitchen amLODipine (NORVASC) 5 MG tablet TAKE 1 TABLET BY MOUTH EVERY DAY  . aspirin EC 81 MG tablet Take 81 mg daily by mouth.  . escitalopram (LEXAPRO) 10 MG tablet TAKE 1 TABLET BY MOUTH EVERY DAY WITH SUPPER  . hydrALAZINE (APRESOLINE) 25 MG tablet TAKE 1 TABLET BY MOUTH 3 TIMES A DAY FOR BLOOD PRESSURE  . hydrochlorothiazide (HYDRODIURIL) 12.5 MG tablet Take 1 tablet (12.5 mg total) by mouth daily.  . vitamin B-12 (CYANOCOBALAMIN) 1000 MCG tablet Take 1,000 mcg by mouth daily.  . [DISCONTINUED] levothyroxine (SYNTHROID, LEVOTHROID) 112 MCG tablet Take 112 mcg daily before breakfast by mouth.  . [DISCONTINUED] metoprolol succinate (TOPROL-XL) 25 MG 24 hr tablet Take 1 tablet (25 mg total) by mouth at bedtime.  .  [DISCONTINUED] QUEtiapine (SEROQUEL) 25 MG tablet Take 25 mg by mouth at bedtime.  . [DISCONTINUED] simvastatin (ZOCOR) 20 MG tablet Take 20 mg by mouth at bedtime.   . traMADol (ULTRAM) 50 MG tablet TAKE 1 TABLET BY MOUTH FOUR TIMES A DAY AS NEEDED FOR PAIN (Patient not taking: Reported on 11/19/2017)  . [EXPIRED] cyanocobalamin ((VITAMIN B-12)) injection 1,000 mcg    No facility-administered encounter medications on file as of 01/08/2018.     Surgical History: Past Surgical History:  Procedure Laterality Date  . ABDOMINAL HYSTERECTOMY    . BREAST SURGERY     benign  . CHOLECYSTECTOMY    . CORONARY ARTERY BYPASS GRAFT    . exicision salivary gland    . MITRAL VALVE REPLACEMENT (MVR)/CORONARY ARTERY BYPASS GRAFTING (CABG)    . posterolateral fusion    . stent placed in kidney Right   . thumb arthroplasty  06/20/2005    Medical History: Past Medical History:  Diagnosis Date  . Arthritis   . CAD (coronary artery disease)   . Diabetes mellitus without complication ([place])   . Hyperlipidemia   . Hypertension   . Renal artery stenosis ([address])   . Thyroid disease     Family History: Family History  Problem Relation Age of Onset  . Colon cancer Mother   . Breast cancer Sister   . Colon cancer Brother       Review of Systems  Constitutional: Positive for fatigue. Negative for activity change, chills and unexpected weight change.  HENT: Negative for congestion, postnasal drip, rhinorrhea, sneezing and  sore throat.   Eyes: Negative.  Negative for redness.  Respiratory: Negative for cough, chest tightness, shortness of breath and wheezing.   Cardiovascular: Negative for chest pain and palpitations.  Gastrointestinal: Negative for abdominal pain, constipation, diarrhea, nausea and vomiting.  Endocrine: Negative for cold intolerance, heat intolerance, polydipsia, polyphagia and polyuria.  Genitourinary: Negative for dysuria and frequency.  Musculoskeletal: Positive for arthralgias.  Negative for back pain, joint swelling and neck pain.       States that she has minimal right knee pain when she extends it fully.   Skin: Negative for rash.  Allergic/Immunologic: Positive for environmental allergies.  Neurological: Negative for dizziness, tremors, numbness and headaches.  Hematological: Negative for adenopathy. Does not bruise/bleed easily.  Psychiatric/Behavioral: Positive for dysphoric mood and hallucinations. Negative for behavioral problems (Depression), sleep disturbance and suicidal ideas. The patient is not nervous/anxious.        Improved and stable on current medications.      Today's Vitals   01/08/18 1037  BP: (!) 154/68  Pulse: 63  Resp: 16  SpO2: 97%  Weight: 159 lb 12.8 oz (72.5 kg)  Height: 5\' 2"  (1.575 m)    Physical Exam  Constitutional: She is oriented to person, place, and time. She appears well-developed and well-nourished.  HENT:  Head: Normocephalic and atraumatic.  Nose: Nose normal.  Mouth/Throat: Oropharynx is clear and moist.  Eyes: Pupils are equal, round, and reactive to light. Conjunctivae and EOM are normal.  Neck: Normal range of motion. Neck supple. No JVD present. Carotid bruit is not present. No tracheal deviation present. No thyromegaly present.  Cardiovascular: Normal rate and regular rhythm.  Murmur heard.  Systolic murmur is present with a grade of 2/6. Pulmonary/Chest: Effort normal and breath sounds normal. She has no wheezes. She has no rales.  Abdominal: Soft. Bowel sounds are normal. There is no tenderness.  Musculoskeletal: Normal range of motion.  Lymphadenopathy:    She has no cervical adenopathy.  Neurological: She is alert and oriented to person, place, and time.  Patient is at her neurological baseline  Skin: Skin is warm and dry. Capillary refill takes less than 2 seconds.  Psychiatric: She has a normal mood and affect. Her behavior is normal. Judgment and thought content normal.  Nursing note and vitals  reviewed.  Depression screen [facility] 2/9 01/08/2018 11/19/2017 09/03/2017 04/22/2017  Decreased Interest 0 0 0 1  Down, Depressed, Hopeless 0 0 0 1  PHQ - 2 Score 0 0 0 2  Altered sleeping - - - 0  Tired, decreased energy - - - 1  Change in appetite - - - 0  Feeling bad or failure about yourself  - - - 1  Trouble concentrating - - - 0  Moving slowly or fidgety/restless - - - 0  Suicidal thoughts - - - 0  PHQ-9 Score - - - 4  Difficult doing work/chores - - - Not difficult at all    Functional Status Survey: Is the patient deaf or have difficulty hearing?: Yes(pt has hearing aids in both ears, going back to recheck them) Does the patient have difficulty seeing, even when wearing glasses/contacts?: Yes(pt has blur vision ) Does the patient have difficulty concentrating, remembering, or making decisions?: Yes Does the patient have difficulty walking or climbing stairs?: Yes(sometimes) Does the patient have difficulty dressing or bathing?: No Does the patient have difficulty doing errands alone such as visiting a doctor's office or shopping?: No  No flowsheet data found.  Fall Risk  01/08/2018 11/19/2017 09/03/2017 07/21/2017 04/22/2017  Falls in the past year? No No Yes Yes Yes  Comment - - - - -  Number falls in past yr: - - 2 or more 2 or more 2 or more  Comment - - - - -  Injury with Fall? - - Yes No No     LABS: Recent Results (from the past 2160 hour(s))  UA/M w/rflx Culture, Routine     Status: Abnormal   Collection Time: 01/08/18 10:51 AM  Result Value Ref Range   Specific Gravity, UA 1.012 1.005 - 1.030   pH, UA 6.5 5.0 - 7.5   Color, UA Yellow Yellow   Appearance Ur Cloudy (A) Clear   Leukocytes, UA 3+ (A) Negative   Protein, UA 1+ (A) Negative/Trace   Glucose, UA Negative Negative   Ketones, UA Negative Negative   RBC, UA Negative Negative   Bilirubin, UA Negative Negative   Urobilinogen, Ur 0.2 0.2 - 1.0 mg/dL   Nitrite, UA Negative Negative   Microscopic Examination  See below:     Comment: Microscopic was indicated and was performed.   Urinalysis Reflex Comment     Comment: This specimen has reflexed to a Urine Culture.  Microscopic Examination     Status: Abnormal   Collection Time: 01/08/18 10:51 AM  Result Value Ref Range   WBC, UA >30 (A) 0 - 5 /hpf    Comment: Clumps of leukocytes present.   RBC, UA 0-2 0 - 2 /hpf   Epithelial Cells (non renal) 0-10 0 - 10 /hpf   Casts None seen None seen /lpf   Bacteria, UA Few None seen/Few  Urine Culture, Reflex     Status: Abnormal   Collection Time: 01/08/18 10:51 AM  Result Value Ref Range   Urine Culture, Routine Final report (A)    Organism ID, Bacteria Escherichia coli (A)     Comment: Greater than 100,000 colony forming units per mL Susceptibility profile is consistent with a probable ESBL.    Antimicrobial Susceptibility Comment     Comment:       ** S = Susceptible; I = Intermediate; R = Resistant **                    P = Positive; N = Negative             MICS are expressed in micrograms per mL    Antibiotic                 RSLT#1    RSLT#2    RSLT#3    RSLT#4 Amoxicillin/Clavulanic Acid    S Ampicillin                     R Cefazolin                      R Cefepime                       S Ceftriaxone                    R Cefuroxime                     R Ciprofloxacin                  R Ertapenem  S Gentamicin                     S Imipenem                       S Levofloxacin                   R Meropenem                      S Nitrofurantoin                 S Piperacillin/Tazobactam        S Tetracycline                   R Tobramycin                     S Trimethoprim/Sulfa             R    Assessment/Plan: 1. Encounter for general adult medical examination with abnormal findings Annual wellness visit today.  2. Essential (primary) hypertension Stable. Continue bp medication as prescribed.   3. Acquired hypothyroidism Thyroid panel stable. Continue  levothyroxine as prescribed.   4. B12 deficiency - cyanocobalamin ((VITAMIN B-12)) injection 1,000 mcg  5. Needs flu shot - Flu Vaccine MDCK QUAD PF  6. Dysuria - UA/M w/rflx Culture, Routine  7. Need for vaccination against Streptococcus pneumoniae using pneumococcal conjugate vaccine 13 - Pneumococcal conjugate vaccine 13-valent IM  General Counseling: Lendy verbalizes understanding of the findings of todays visit and agrees with plan of treatment. I have discussed any further diagnostic evaluation that may be needed or ordered today. We also reviewed her medications today. she has been encouraged to call the office with any questions or concerns that should arise related to todays visit.    Counseling:  Hypertension Counseling:   The following hypertensive lifestyle modification were recommended and discussed:  1. Limiting alcohol intake to less than 1 oz/day of ethanol:(24 oz of beer or 8 oz of wine or 2 oz of 100-proof whiskey). 2. Take baby ASA 81 mg daily. 3. Importance of regular aerobic exercise and losing weight. 4. Reduce dietary saturated fat and cholesterol intake for overall cardiovascular health. 5. Maintaining adequate dietary potassium, calcium, and magnesium intake. 6. Regular monitoring of the blood pressure. 7. Reduce sodium intake to less than 100 mmol/day (less than 2.3 gm of sodium or less than 6 gm of sodium choride)   This patient was seen by [place] with Dr Lavera Guise as a part of collaborative care agreement   Orders Placed This Encounter  Procedures  . Microscopic Examination  . Urine Culture, Reflex  . Flu Vaccine MDCK QUAD PF  . Pneumococcal conjugate vaccine 13-valent IM  . UA/M w/rflx Culture, Routine    Meds ordered this encounter  Medications  . cyanocobalamin ((VITAMIN B-12)) injection 1,000 mcg    Time spent: [place], MD  Internal Medicine

## 2018-01-09 ENCOUNTER — Other Ambulatory Visit: Payer: Self-pay | Admitting: Internal Medicine

## 2018-01-09 DIAGNOSIS — I2510 Atherosclerotic heart disease of native coronary artery without angina pectoris: Secondary | ICD-10-CM

## 2018-01-10 LAB — UA/M W/RFLX CULTURE, ROUTINE
Bilirubin, UA: NEGATIVE
GLUCOSE, UA: NEGATIVE
Ketones, UA: NEGATIVE
Nitrite, UA: NEGATIVE
RBC, UA: NEGATIVE
SPEC GRAV UA: 1.012 (ref 1.005–1.030)
Urobilinogen, Ur: 0.2 mg/dL (ref 0.2–1.0)
pH, UA: 6.5 (ref 5.0–7.5)

## 2018-01-10 LAB — MICROSCOPIC EXAMINATION
Casts: NONE SEEN /lpf
WBC, UA: >30 /hpf — AB (ref 0–5)

## 2018-01-10 LAB — URINE CULTURE, REFLEX

## 2018-01-11 ENCOUNTER — Other Ambulatory Visit: Payer: Self-pay | Admitting: Nurse Practitioner

## 2018-01-11 ENCOUNTER — Telehealth: Payer: Self-pay

## 2018-01-11 DIAGNOSIS — N390 Urinary tract infection, site not specified: Secondary | ICD-10-CM

## 2018-01-11 MED ORDER — NITROFURANTOIN MONOHYD MACRO 100 MG PO CAPS: 100.0000 mg | ORAL_CAPSULE | Freq: Two times a day (BID) | ORAL | 0 refills | Status: DC

## 2018-01-11 NOTE — Telephone Encounter (Signed)
Pt advised urine did showed Uti we send macrobid

## 2018-01-11 NOTE — Telephone Encounter (Signed)
Can you check its on ims not on epic pt was just seen by you

## 2018-01-15 ENCOUNTER — Other Ambulatory Visit: Payer: Self-pay

## 2018-01-15 MED ORDER — METOPROLOL SUCCINATE ER 25 MG PO TB24: 25.0000 mg | ORAL_TABLET | Freq: Every day | ORAL | 1 refills | Status: DC

## 2018-01-19 ENCOUNTER — Other Ambulatory Visit: Payer: Self-pay

## 2018-01-19 MED ORDER — LEVOTHYROXINE SODIUM 112 MCG PO TABS: 112.0000 ug | ORAL_TABLET | Freq: Every day | ORAL | 3 refills | Status: DC

## 2018-01-20 DIAGNOSIS — Z0001 Encounter for general adult medical examination with abnormal findings: Secondary | ICD-10-CM | POA: Insufficient documentation

## 2018-01-21 DIAGNOSIS — I340 Nonrheumatic mitral (valve) insufficiency: Secondary | ICD-10-CM | POA: Diagnosis not present

## 2018-01-21 DIAGNOSIS — I6523 Occlusion and stenosis of bilateral carotid arteries: Secondary | ICD-10-CM | POA: Diagnosis not present

## 2018-01-21 DIAGNOSIS — E782 Mixed hyperlipidemia: Secondary | ICD-10-CM | POA: Diagnosis not present

## 2018-01-21 DIAGNOSIS — I25810 Atherosclerosis of coronary artery bypass graft(s) without angina pectoris: Secondary | ICD-10-CM | POA: Diagnosis not present

## 2018-01-21 DIAGNOSIS — I10 Essential (primary) hypertension: Secondary | ICD-10-CM | POA: Diagnosis not present

## 2018-02-05 ENCOUNTER — Ambulatory Visit (INDEPENDENT_AMBULATORY_CARE_PROVIDER_SITE_OTHER): Payer: Medicare Other

## 2018-02-05 DIAGNOSIS — E538 Deficiency of other specified B group vitamins: Secondary | ICD-10-CM | POA: Diagnosis not present

## 2018-02-05 DIAGNOSIS — I25810 Atherosclerosis of coronary artery bypass graft(s) without angina pectoris: Secondary | ICD-10-CM | POA: Diagnosis not present

## 2018-02-05 DIAGNOSIS — I6523 Occlusion and stenosis of bilateral carotid arteries: Secondary | ICD-10-CM | POA: Diagnosis not present

## 2018-02-05 DIAGNOSIS — I340 Nonrheumatic mitral (valve) insufficiency: Secondary | ICD-10-CM | POA: Diagnosis not present

## 2018-02-05 MED ORDER — CYANOCOBALAMIN 1000 MCG/ML IJ SOLN
1000.0000 ug | Freq: Once | INTRAMUSCULAR | Status: AC
  Administered 2018-02-05: 1000 ug via INTRAMUSCULAR

## 2018-02-15 DIAGNOSIS — I340 Nonrheumatic mitral (valve) insufficiency: Secondary | ICD-10-CM | POA: Diagnosis not present

## 2018-02-15 DIAGNOSIS — I10 Essential (primary) hypertension: Secondary | ICD-10-CM | POA: Diagnosis not present

## 2018-02-15 DIAGNOSIS — E782 Mixed hyperlipidemia: Secondary | ICD-10-CM | POA: Diagnosis not present

## 2018-02-15 DIAGNOSIS — I25810 Atherosclerosis of coronary artery bypass graft(s) without angina pectoris: Secondary | ICD-10-CM | POA: Diagnosis not present

## 2018-02-15 DIAGNOSIS — I6523 Occlusion and stenosis of bilateral carotid arteries: Secondary | ICD-10-CM | POA: Diagnosis not present

## 2018-02-21 ENCOUNTER — Other Ambulatory Visit: Payer: Self-pay | Admitting: Internal Medicine

## 2018-02-22 ENCOUNTER — Other Ambulatory Visit: Payer: Self-pay | Admitting: Nurse Practitioner

## 2018-02-22 MED ORDER — ALPRAZOLAM 0.25 MG PO TABS: 0.2500 mg | ORAL_TABLET | Freq: Every day | ORAL | 1 refills | Status: DC | PRN

## 2018-02-22 NOTE — Telephone Encounter (Signed)
Yes please. This is fine. Thanks.

## 2018-02-22 NOTE — Telephone Encounter (Signed)
Is this ok?

## 2018-03-16 ENCOUNTER — Other Ambulatory Visit: Payer: Self-pay

## 2018-03-16 MED ORDER — HYDRALAZINE HCL 25 MG PO TABS: ORAL_TABLET | ORAL | 2 refills | Status: DC

## 2018-03-18 ENCOUNTER — Other Ambulatory Visit: Payer: Self-pay | Admitting: Nurse Practitioner

## 2018-03-18 DIAGNOSIS — D519 Vitamin B12 deficiency anemia, unspecified: Secondary | ICD-10-CM | POA: Diagnosis not present

## 2018-03-18 DIAGNOSIS — H903 Sensorineural hearing loss, bilateral: Secondary | ICD-10-CM | POA: Diagnosis not present

## 2018-03-18 DIAGNOSIS — E039 Hypothyroidism, unspecified: Secondary | ICD-10-CM | POA: Diagnosis not present

## 2018-03-19 LAB — BASIC METABOLIC PANEL
BUN/Creatinine Ratio: 24 (ref 12–28)
BUN: 33 mg/dL — AB (ref 8–27)
CALCIUM: 8.8 mg/dL (ref 8.7–10.3)
CHLORIDE: 101 mmol/L (ref 96–106)
CO2: 22 mmol/L (ref 20–29)
Creatinine, Ser: 1.4 mg/dL — ABNORMAL HIGH (ref 0.57–1.00)
GFR, EST AFRICAN AMERICAN: 40 mL/min/{1.73_m2} — AB (ref >59)
GFR, EST NON AFRICAN AMERICAN: 34 mL/min/{1.73_m2} — AB (ref >59)
Glucose: 94 mg/dL (ref 65–99)
Potassium: 4.1 mmol/L (ref 3.5–5.2)
Sodium: 142 mmol/L (ref 134–144)

## 2018-03-19 LAB — CBC
HEMOGLOBIN: 12 g/dL (ref 11.1–15.9)
Hematocrit: 37.4 % (ref 34.0–46.6)
MCH: 27.3 pg (ref 26.6–33.0)
MCHC: 32.1 g/dL (ref 31.5–35.7)
MCV: 85 fL (ref 79–97)
Platelets: 144 10*3/uL — ABNORMAL LOW (ref 150–450)
RBC: 4.39 x10E6/uL (ref 3.77–5.28)
RDW: 14.4 % (ref 12.3–15.4)
WBC: 16.7 10*3/uL — ABNORMAL HIGH (ref 3.4–10.8)

## 2018-03-19 LAB — T4, FREE: Free T4: 1.7 ng/dL (ref 0.82–1.77)

## 2018-03-19 LAB — TSH: TSH: 0.608 u[IU]/mL (ref 0.450–4.500)

## 2018-03-19 LAB — T3: T3 TOTAL: 63 ng/dL — AB (ref 71–180)

## 2018-03-23 ENCOUNTER — Other Ambulatory Visit: Payer: Self-pay | Admitting: Nurse Practitioner

## 2018-03-23 ENCOUNTER — Other Ambulatory Visit: Payer: Self-pay

## 2018-03-23 ENCOUNTER — Telehealth: Payer: Self-pay

## 2018-03-23 DIAGNOSIS — D72829 Elevated white blood cell count, unspecified: Secondary | ICD-10-CM

## 2018-03-23 MED ORDER — AMOXICILLIN-POT CLAVULANATE 875-125 MG PO TABS: 1.0000 | ORAL_TABLET | Freq: Two times a day (BID) | ORAL | 0 refills | Status: DC

## 2018-03-23 NOTE — Telephone Encounter (Signed)
I put a lab slip on your desk to mail. Thanks.

## 2018-03-23 NOTE — Telephone Encounter (Signed)
Pt daughter advised for labs and advised we going mailed labslip

## 2018-03-23 NOTE — Progress Notes (Signed)
Lab work did indicate infection with elevated white blood cell count. I started her on augmentin twice daily for 10 days. Labs also indicate some dehydration. I would like her to complete antibiotics then repeat labs in 3 weeks. Will reassess at that point.

## 2018-03-23 NOTE — Telephone Encounter (Signed)
Lab work did indicate infection with elevated white blood cell count. I started her on augmentin twice daily for 10 days. Labs also indicate some dehydration. I would like her to complete antibiotics then repeat labs in 3 weeks. Will reassess at that point. Blood count looks fine though. We can hold b12 injections for a while.

## 2018-03-24 ENCOUNTER — Other Ambulatory Visit: Payer: Self-pay | Admitting: Nurse Practitioner

## 2018-03-24 DIAGNOSIS — F419 Anxiety disorder, unspecified: Secondary | ICD-10-CM

## 2018-03-24 MED ORDER — ALPRAZOLAM 0.25 MG PO TABS: 0.2500 mg | ORAL_TABLET | Freq: Every day | ORAL | 1 refills | Status: DC | PRN

## 2018-03-24 NOTE — Telephone Encounter (Signed)
Mailed labslip 

## 2018-03-24 NOTE — Progress Notes (Signed)
Sent new rx for alprazolam to [facility].

## 2018-03-24 NOTE — Telephone Encounter (Signed)
Sent new rx for alprazolam to [facility].

## 2018-04-29 ENCOUNTER — Other Ambulatory Visit: Payer: Self-pay | Admitting: Nurse Practitioner

## 2018-04-29 DIAGNOSIS — D72829 Elevated white blood cell count, unspecified: Secondary | ICD-10-CM | POA: Diagnosis not present

## 2018-04-29 DIAGNOSIS — R944 Abnormal results of kidney function studies: Secondary | ICD-10-CM | POA: Diagnosis not present

## 2018-04-30 LAB — BASIC METABOLIC PANEL
BUN/Creatinine Ratio: 12 (ref 12–28)
BUN: 16 mg/dL (ref 8–27)
CALCIUM: 9.1 mg/dL (ref 8.7–10.3)
CO2: 24 mmol/L (ref 20–29)
CREATININE: 1.35 mg/dL — AB (ref 0.57–1.00)
Chloride: 102 mmol/L (ref 96–106)
GFR calc non Af Amer: 36 mL/min/{1.73_m2} — ABNORMAL LOW (ref >59)
GFR, EST AFRICAN AMERICAN: 41 mL/min/{1.73_m2} — AB (ref >59)
Glucose: 111 mg/dL — ABNORMAL HIGH (ref 65–99)
POTASSIUM: 4.5 mmol/L (ref 3.5–5.2)
Sodium: 143 mmol/L (ref 134–144)

## 2018-04-30 LAB — CBC
HEMOGLOBIN: 12.2 g/dL (ref 11.1–15.9)
Hematocrit: 37.1 % (ref 34.0–46.6)
MCH: 28.9 pg (ref 26.6–33.0)
MCHC: 32.9 g/dL (ref 31.5–35.7)
MCV: 88 fL (ref 79–97)
Platelets: 176 10*3/uL (ref 150–450)
RBC: 4.22 x10E6/uL (ref 3.77–5.28)
RDW: 14.4 % (ref 12.3–15.4)
WBC: 7.4 10*3/uL (ref 3.4–10.8)

## 2018-05-06 DIAGNOSIS — H353131 Nonexudative age-related macular degeneration, bilateral, early dry stage: Secondary | ICD-10-CM | POA: Diagnosis not present

## 2018-05-10 ENCOUNTER — Other Ambulatory Visit: Payer: Self-pay

## 2018-05-10 MED ORDER — ALLOPURINOL 100 MG PO TABS: ORAL_TABLET | ORAL | 0 refills | Status: DC

## 2018-05-24 ENCOUNTER — Other Ambulatory Visit: Payer: Self-pay

## 2018-05-25 ENCOUNTER — Other Ambulatory Visit: Payer: Self-pay | Admitting: Nurse Practitioner

## 2018-05-25 DIAGNOSIS — F419 Anxiety disorder, unspecified: Secondary | ICD-10-CM

## 2018-05-25 MED ORDER — ALPRAZOLAM 0.25 MG PO TABS: 0.2500 mg | ORAL_TABLET | Freq: Every day | ORAL | 2 refills | Status: DC | PRN

## 2018-05-25 NOTE — Progress Notes (Signed)
Approved and resent approval for patient's alprazolam 0.25mg  QD as needed. Sent to [facility]. Follow up scheduled 07/15/2018

## 2018-07-01 ENCOUNTER — Ambulatory Visit (INDEPENDENT_AMBULATORY_CARE_PROVIDER_SITE_OTHER): Payer: Medicare Other | Admitting: Nurse Practitioner

## 2018-07-01 ENCOUNTER — Other Ambulatory Visit: Payer: Self-pay

## 2018-07-01 VITALS — BP 162/66 | HR 56 | Resp 16 | Ht 62.0 in | Wt 157.8 lb

## 2018-07-01 DIAGNOSIS — R300 Dysuria: Secondary | ICD-10-CM

## 2018-07-01 DIAGNOSIS — D72829 Elevated white blood cell count, unspecified: Secondary | ICD-10-CM

## 2018-07-01 DIAGNOSIS — N390 Urinary tract infection, site not specified: Secondary | ICD-10-CM | POA: Diagnosis not present

## 2018-07-01 DIAGNOSIS — I10 Essential (primary) hypertension: Secondary | ICD-10-CM

## 2018-07-01 LAB — POCT URINALYSIS DIPSTICK
Bilirubin, UA: NEGATIVE
GLUCOSE UA: NEGATIVE
KETONES UA: NEGATIVE
NITRITE UA: NEGATIVE
Protein, UA: POSITIVE — AB
RBC UA: NEGATIVE
Urobilinogen, UA: 0.2 E.U./dL
pH, UA: 6 (ref 5.0–8.0)

## 2018-07-01 MED ORDER — AMOXICILLIN-POT CLAVULANATE 875-125 MG PO TABS: 1.0000 | ORAL_TABLET | Freq: Two times a day (BID) | ORAL | 0 refills | Status: DC

## 2018-07-01 NOTE — Progress Notes (Signed)
Pt blood pressure elevated, checked twice 1st reading 176/69 machine 2 reading 162/66 manually Informed provider

## 2018-07-01 NOTE — Progress Notes (Signed)
[facility] [place], [place] [address]  Internal MEDICINE  Office Visit Note  Patient Name: Maria Sanchez  [id number]  [id number]  Date of Service: 07/14/2018   Pt is here for a sick visit.   Chief Complaint  Patient presents with  . Urinary Tract Infection    pt had been dizzy, and her memory has been off.      She did have CBC checked since she was last seen. Her white blood cell count had been elevated. New labs indicate normal results.   Urinary Tract Infection   This is a new problem. The current episode started in the past 7 days. The problem occurs every urination. The quality of the pain is described as aching and burning. The pain is mild. There has been no fever. She is not sexually active. There is no history of pyelonephritis. Associated symptoms include flank pain, frequency, nausea and urgency. Pertinent negatives include no chills or vomiting. She has tried increased fluids for the symptoms. The treatment provided no relief.       Current Medication:  Outpatient Encounter Medications as of 07/01/2018  Medication Sig  . allopurinol (ZYLOPRIM) 100 MG tablet TAKE 2 TABLETS BY MOUTH AT BEDTIME AS NEEDED FOR GOUT  . ALPRAZolam (XANAX) 0.25 MG tablet Take 1 tablet (0.25 mg total) by mouth daily as needed (for nerves).  Marland Kitchen amLODipine (NORVASC) 5 MG tablet TAKE 1 TABLET BY MOUTH EVERY DAY  . aspirin EC 81 MG tablet Take 81 mg daily by mouth.  . escitalopram (LEXAPRO) 10 MG tablet TAKE 1 TABLET BY MOUTH EVERY DAY WITH SUPPER  . hydrALAZINE (APRESOLINE) 25 MG tablet TAKE 1 TABLET BY MOUTH 3 TIMES A DAY FOR BLOOD PRESSURE  . levothyroxine (SYNTHROID, LEVOTHROID) 112 MCG tablet Take 1 tablet (112 mcg total) by mouth daily before breakfast.  . metoprolol succinate (TOPROL-XL) 25 MG 24 hr tablet Take 1 tablet (25 mg total) by mouth at bedtime.  . pravastatin (PRAVACHOL) 10 MG tablet Take 1 tablet by mouth.  Marland Kitchen amoxicillin-clavulanate (AUGMENTIN)  875-125 MG tablet Take 1 tablet by mouth 2 (two) times daily.  . hydrochlorothiazide (HYDRODIURIL) 12.5 MG tablet Take 1 tablet (12.5 mg total) by mouth daily. (Patient not taking: Reported on 07/01/2018)  . nitrofurantoin, macrocrystal-monohydrate, (MACROBID) 100 MG capsule Take 1 capsule (100 mg total) by mouth 2 (two) times daily.  . simvastatin (ZOCOR) 20 MG tablet TAKE 1 TABLET BY MOUTH DAILY AT BEDTIME (Patient not taking: Reported on 07/01/2018)  . traMADol (ULTRAM) 50 MG tablet TAKE 1 TABLET BY MOUTH FOUR TIMES A DAY AS NEEDED FOR PAIN (Patient not taking: Reported on 11/19/2017)  . vitamin B-12 (CYANOCOBALAMIN) 1000 MCG tablet Take 1,000 mcg by mouth daily.  . [DISCONTINUED] amoxicillin-clavulanate (AUGMENTIN) 875-125 MG tablet Take 1 tablet by mouth 2 (two) times daily. (Patient not taking: Reported on 07/01/2018)   No facility-administered encounter medications on file as of 07/01/2018.       Medical History: Past Medical History:  Diagnosis Date  . Arthritis   . CAD (coronary artery disease)   . Diabetes mellitus without complication ([place])   . Hyperlipidemia   . Hypertension   . Renal artery stenosis ([place])   . Thyroid disease     Today's Vitals   07/01/18 1216  BP: (!) 162/66  Pulse: (!) 56  Resp: 16  SpO2: 95%  Weight: 157 lb 12.8 oz (71.6 kg)  Height: 5\' 2"  (1.575 m)   Body mass index is  28.86 kg/m.  Review of Systems  Constitutional: Positive for fatigue. Negative for chills and unexpected weight change.  HENT: Negative for congestion, postnasal drip, rhinorrhea, sneezing and sore throat.   Respiratory: Negative for cough, chest tightness, shortness of breath and wheezing.   Cardiovascular: Negative for chest pain and palpitations.       Blood pressure elevated today.  Gastrointestinal: Positive for nausea. Negative for abdominal pain, constipation, diarrhea and vomiting.  Genitourinary: Positive for flank pain, frequency and urgency. Negative for dysuria.   Musculoskeletal: Positive for back pain. Negative for arthralgias, joint swelling and neck pain.  Skin: Negative for rash.  Neurological: Positive for headaches. Negative for tremors and numbness.  Hematological: Negative for adenopathy. Does not bruise/bleed easily.  Psychiatric/Behavioral: Positive for dysphoric mood. Negative for behavioral problems (Depression), sleep disturbance and suicidal ideas. The patient is nervous/anxious.     Physical Exam Constitutional:      General: She is not in acute distress.    Appearance: Normal appearance. She is well-developed. She is not diaphoretic.  HENT:     Head: Normocephalic and atraumatic.     Mouth/Throat:     Pharynx: No oropharyngeal exudate.  Eyes:     Pupils: Pupils are equal, round, and reactive to light.  Neck:     Musculoskeletal: Normal range of motion and neck supple.     Thyroid: No thyromegaly.     Vascular: No JVD.     Trachea: No tracheal deviation.  Cardiovascular:     Rate and Rhythm: Normal rate and regular rhythm.     Heart sounds: Normal heart sounds. No murmur. No friction rub. No gallop.   Pulmonary:     Effort: Pulmonary effort is normal. No respiratory distress.     Breath sounds: Normal breath sounds. No wheezing or rales.  Chest:     Chest wall: No tenderness.  Abdominal:     General: Bowel sounds are normal.     Palpations: Abdomen is soft.     Tenderness: There is no abdominal tenderness.  Genitourinary:    Comments: Urine sample positive for large WBC and positive for protein. Lymphadenopathy:     Cervical: No cervical adenopathy.  Skin:    General: Skin is warm and dry.  Neurological:     Mental Status: She is alert and oriented to person, place, and time. Mental status is at baseline.     Cranial Nerves: No cranial nerve deficit.  Psychiatric:        Behavior: Behavior normal.        Thought Content: Thought content normal.        Judgment: Judgment normal.   Assessment/Plan: 1. Urinary  tract infection without hematuria, site unspecified Initially started augmentin 875mg  twice daily for 10 days. Culture results indicated this is not effective and changed to macrobid twice daily based on culture results.  Start macrobid 100mg  bid for ten days. Send urine for culture and sensitivity and adjust antibiotics as indicated.  - nitrofurantoin, macrocrystal-monohydrate, (MACROBID) 100 MG capsule; Take 1 capsule (100 mg total) by mouth 2 (two) times daily.  Dispense: 20 capsule; Refill: 0  2. Leukocytosis, unspecified type Reviewed labs which indicated WBC had returned to normal.  - amoxicillin-clavulanate (AUGMENTIN) 875-125 MG tablet; Take 1 tablet by mouth 2 (two) times daily.  Dispense: 20 tablet; Refill: 0  3. Essential (primary) hypertension Generally stable. Continue bp medication as prescribed   4. Dysuria - POCT Urinalysis Dipstick - CULTURE, URINE COMPREHENSIVE  General Counseling: Julian Hy  understanding of the findings of todays visit and agrees with plan of treatment. I have discussed any further diagnostic evaluation that may be needed or ordered today. We also reviewed her medications today. she has been encouraged to call the office with any questions or concerns that should arise related to todays visit.    Counseling:  This patient was seen by Leretha Pol FNP Collaboration with Dr Lavera Guise as a part of collaborative care agreement  Orders Placed This Encounter  Procedures  . CULTURE, URINE COMPREHENSIVE  . POCT Urinalysis Dipstick    Meds ordered this encounter  Medications  . amoxicillin-clavulanate (AUGMENTIN) 875-125 MG tablet    Sig: Take 1 tablet by mouth 2 (two) times daily.    Dispense:  20 tablet    Refill:  0    Order Specific Question:   Supervising Provider    Answer:   Lavera Guise [[id number]]  . nitrofurantoin, macrocrystal-monohydrate, (MACROBID) 100 MG capsule    Sig: Take 1 capsule (100 mg total) by mouth 2 (two) times daily.     Dispense:  20 capsule    Refill:  0    Please advise patient to discontinue augmentin    Order Specific Question:   Supervising Provider    Answer:   Lavera Guise [[id number]]    Time spent: 25 Minutes

## 2018-07-04 LAB — CULTURE, URINE COMPREHENSIVE

## 2018-07-05 MED ORDER — NITROFURANTOIN MONOHYD MACRO 100 MG PO CAPS: 100.0000 mg | ORAL_CAPSULE | Freq: Two times a day (BID) | ORAL | 0 refills | Status: DC

## 2018-07-06 ENCOUNTER — Telehealth: Payer: Self-pay

## 2018-07-06 NOTE — Telephone Encounter (Signed)
Pt daughter  advised that stopped augmentin and start new med we send to [facility] its macrobid

## 2018-07-14 ENCOUNTER — Encounter: Payer: Self-pay | Admitting: Nurse Practitioner

## 2018-07-14 DIAGNOSIS — R300 Dysuria: Secondary | ICD-10-CM | POA: Insufficient documentation

## 2018-07-14 DIAGNOSIS — N390 Urinary tract infection, site not specified: Secondary | ICD-10-CM | POA: Insufficient documentation

## 2018-07-14 DIAGNOSIS — D72829 Elevated white blood cell count, unspecified: Secondary | ICD-10-CM | POA: Insufficient documentation

## 2018-07-15 ENCOUNTER — Other Ambulatory Visit: Payer: Self-pay | Admitting: Nurse Practitioner

## 2018-07-15 ENCOUNTER — Ambulatory Visit: Payer: Self-pay | Admitting: Nurse Practitioner

## 2018-07-15 MED ORDER — METOPROLOL SUCCINATE ER 25 MG PO TB24: 25.0000 mg | ORAL_TABLET | Freq: Every day | ORAL | 1 refills | Status: DC

## 2018-07-15 MED ORDER — AMLODIPINE BESYLATE 5 MG PO TABS: 5.0000 mg | ORAL_TABLET | Freq: Every day | ORAL | 1 refills | Status: DC

## 2018-08-18 ENCOUNTER — Encounter: Payer: Self-pay | Admitting: Nurse Practitioner

## 2018-09-02 ENCOUNTER — Encounter: Payer: Self-pay | Admitting: Nurse Practitioner

## 2018-09-02 ENCOUNTER — Ambulatory Visit (INDEPENDENT_AMBULATORY_CARE_PROVIDER_SITE_OTHER): Payer: Medicare Other | Admitting: Internal Medicine

## 2018-09-02 ENCOUNTER — Other Ambulatory Visit: Payer: Self-pay

## 2018-09-02 DIAGNOSIS — M150 Primary generalized (osteo)arthritis: Secondary | ICD-10-CM | POA: Diagnosis not present

## 2018-09-02 DIAGNOSIS — F419 Anxiety disorder, unspecified: Secondary | ICD-10-CM

## 2018-09-02 DIAGNOSIS — I10 Essential (primary) hypertension: Secondary | ICD-10-CM

## 2018-09-02 DIAGNOSIS — I2510 Atherosclerotic heart disease of native coronary artery without angina pectoris: Secondary | ICD-10-CM | POA: Insufficient documentation

## 2018-09-02 MED ORDER — ALPRAZOLAM 0.25 MG PO TABS: 0.2500 mg | ORAL_TABLET | Freq: Every day | ORAL | 2 refills | Status: DC | PRN

## 2018-09-02 NOTE — Progress Notes (Signed)
[facility] [place], [place] [address]  Internal MEDICINE  Telephone Visit  Patient Name: Maria Sanchez  [id number]  [id number]  Date of Service: 09/07/2018  I connected with the patient at 10:38 am  by telephone and verified the patients identity using two identifiers.   I discussed the limitations, risks, security and privacy concerns of performing an evaluation and management service by telephone and the availability of in person appointments. I also discussed with the patient that there may be a patient responsible charge related to the service.  The patient expressed understanding and agrees to proceed.    Chief Complaint  Patient presents with  . Telephone Screen    PHONE VISIT [phone]  . Telephone Assessment  . Medical Management of Chronic Issues    HPI  Pt is connected via telephone for prevention of pandemic for COVID -19 spread.  Feels depressed about staying home all the time, sleeps well at night. Her daughter looks after her. Does take xanax at night, pt did fall 2 weeks ago as she missed a step   Current Medication: Outpatient Encounter Medications as of 09/02/2018  Medication Sig  . allopurinol (ZYLOPRIM) 100 MG tablet TAKE 2 TABLETS BY MOUTH AT BEDTIME AS NEEDED FOR GOUT  . ALPRAZolam (XANAX) 0.25 MG tablet Take 1 tablet (0.25 mg total) by mouth daily as needed (for nerves).  Marland Kitchen amLODipine (NORVASC) 5 MG tablet Take 1 tablet (5 mg total) by mouth daily.  Marland Kitchen aspirin EC 81 MG tablet Take 81 mg daily by mouth.  . escitalopram (LEXAPRO) 10 MG tablet TAKE 1 TABLET BY MOUTH EVERY DAY WITH SUPPER  . hydrALAZINE (APRESOLINE) 25 MG tablet TAKE 1 TABLET BY MOUTH 3 TIMES A DAY FOR BLOOD PRESSURE  . levothyroxine (SYNTHROID, LEVOTHROID) 112 MCG tablet Take 1 tablet (112 mcg total) by mouth daily before breakfast.  . metoprolol succinate (TOPROL-XL) 25 MG 24 hr tablet Take 1 tablet (25 mg total) by mouth at bedtime.  . nitrofurantoin,  macrocrystal-monohydrate, (MACROBID) 100 MG capsule Take 1 capsule (100 mg total) by mouth 2 (two) times daily.  . pravastatin (PRAVACHOL) 10 MG tablet Take 1 tablet by mouth.  . vitamin B-12 (CYANOCOBALAMIN) 1000 MCG tablet Take 1,000 mcg by mouth daily.  . [DISCONTINUED] ALPRAZolam (XANAX) 0.25 MG tablet Take 1 tablet (0.25 mg total) by mouth daily as needed (for nerves).  . [DISCONTINUED] amoxicillin-clavulanate (AUGMENTIN) 875-125 MG tablet Take 1 tablet by mouth 2 (two) times daily.  . hydrochlorothiazide (HYDRODIURIL) 12.5 MG tablet Take 1 tablet (12.5 mg total) by mouth daily. (Patient not taking: Reported on 07/01/2018)  . simvastatin (ZOCOR) 20 MG tablet TAKE 1 TABLET BY MOUTH DAILY AT BEDTIME (Patient not taking: Reported on 07/01/2018)  . traMADol (ULTRAM) 50 MG tablet TAKE 1 TABLET BY MOUTH FOUR TIMES A DAY AS NEEDED FOR PAIN (Patient not taking: Reported on 11/19/2017)   No facility-administered encounter medications on file as of 09/02/2018.     Surgical History: Past Surgical History:  Procedure Laterality Date  . ABDOMINAL HYSTERECTOMY    . BREAST SURGERY     benign  . CHOLECYSTECTOMY    . CORONARY ARTERY BYPASS GRAFT    . exicision salivary gland    . MITRAL VALVE REPLACEMENT (MVR)/CORONARY ARTERY BYPASS GRAFTING (CABG)    . posterolateral fusion    . stent placed in kidney Right   . thumb arthroplasty  06/20/2005    Medical History: Past Medical History:  Diagnosis Date  . Arthritis   .  CAD (coronary artery disease)   . Diabetes mellitus without complication ([place])   . Hyperlipidemia   . Hypertension   . Renal artery stenosis ([place])   . Thyroid disease     Family History: Family History  Problem Relation Age of Onset  . Colon cancer Mother   . Breast cancer Sister   . Colon cancer Brother     Social History   Socioeconomic History  . Marital status: Widowed    Spouse name: Not on file  . Number of children: Not on file  . Years of education: Not on file   . Highest education level: Not on file  Occupational History  . Not on file  Social Needs  . Financial resource strain: Not on file  . Food insecurity:    Worry: Not on file    Inability: Not on file  . Transportation needs:    Medical: Not on file    Non-medical: Not on file  Tobacco Use  . Smoking status: Never Smoker  . Smokeless tobacco: Never Used  Substance and Sexual Activity  . Alcohol use: No    Frequency: Never  . Drug use: No  . Sexual activity: Never  Lifestyle  . Physical activity:    Days per week: Not on file    Minutes per session: Not on file  . Stress: Not on file  Relationships  . Social connections:    Talks on phone: Not on file    Gets together: Not on file    Attends religious service: Not on file    Active member of club or organization: Not on file    Attends meetings of clubs or organizations: Not on file    Relationship status: Not on file  . Intimate partner violence:    Fear of current or ex partner: Not on file    Emotionally abused: Not on file    Physically abused: Not on file    Forced sexual activity: Not on file  Other Topics Concern  . Not on file  Social History Narrative  . Not on file    Review of Systems  Constitutional: Negative.  Negative for chills, diaphoresis and fatigue.  HENT: Negative for ear pain, postnasal drip and sinus pressure.   Eyes: Negative for photophobia, discharge, redness, itching and visual disturbance.  Respiratory: Negative for cough, shortness of breath and wheezing.   Cardiovascular: Negative for chest pain, palpitations and leg swelling.  Gastrointestinal: Negative for abdominal pain, constipation, diarrhea, nausea and vomiting.  Genitourinary: Negative for dysuria and flank pain.  Musculoskeletal: Negative for arthralgias, back pain, gait problem and neck pain.  Skin: Negative for color change.  Allergic/Immunologic: Negative for environmental allergies and food allergies.  Neurological:  Negative for dizziness and headaches.  Hematological: Does not bruise/bleed easily.  Psychiatric/Behavioral: Negative for agitation, behavioral problems (depression) and hallucinations.    Vital Signs: Ht 5\' 2"  (1.575 m)   Wt 157 lb (71.2 kg)   BMI 28.72 kg/m   Observation/Objective: Spoke with her daughter and herself, no acute problems, pleasant to talk to    Assessment/Plan: 1. Essential (primary) hypertension - Continue all meds as before, monitor at home  2. Anxiety disorder, unspecified type - ALPRAZolam (XANAX) 0.25 MG tablet; Take 1 tablet (0.25 mg total) by mouth daily as needed (for nerves).  Dispense: 30 tablet; Refill: 2  3. Primary generalized (osteo)arthritis - Encouraged ROM and weight bearing for now, take tramadol prn    General Counseling: Chaunda verbalizes understanding  of the findings of today's phone visit and agrees with plan of treatment. I have discussed any further diagnostic evaluation that may be needed or ordered today. We also reviewed her medications today. she has been encouraged to call the office with any questions or concerns that should arise related to todays visit.  Meds ordered this encounter  Medications  . ALPRAZolam (XANAX) 0.25 MG tablet    Sig: Take 1 tablet (0.25 mg total) by mouth daily as needed (for nerves).    Dispense:  30 tablet    Refill:  2   Time spent:15 Minutes Dr Lavera Guise Internal medicine

## 2018-10-12 ENCOUNTER — Other Ambulatory Visit: Payer: Self-pay

## 2018-10-12 MED ORDER — ESCITALOPRAM OXALATE 10 MG PO TABS: ORAL_TABLET | ORAL | 3 refills | Status: DC

## 2018-11-03 ENCOUNTER — Ambulatory Visit: Payer: Medicare Other | Admitting: Adult Health

## 2018-11-05 ENCOUNTER — Ambulatory Visit: Payer: Medicare Other | Admitting: Nurse Practitioner

## 2018-11-24 ENCOUNTER — Encounter: Payer: Self-pay | Admitting: Adult Health

## 2018-11-24 ENCOUNTER — Ambulatory Visit (INDEPENDENT_AMBULATORY_CARE_PROVIDER_SITE_OTHER): Payer: Medicare Other | Admitting: Adult Health

## 2018-11-24 ENCOUNTER — Other Ambulatory Visit: Payer: Self-pay

## 2018-11-24 VITALS — BP 162/69 | HR 56 | Temp 99.0°F | Resp 16 | Ht 62.0 in | Wt 159.0 lb

## 2018-11-24 DIAGNOSIS — Z79899 Other long term (current) drug therapy: Secondary | ICD-10-CM

## 2018-11-24 DIAGNOSIS — R300 Dysuria: Secondary | ICD-10-CM | POA: Diagnosis not present

## 2018-11-24 DIAGNOSIS — N3000 Acute cystitis without hematuria: Secondary | ICD-10-CM | POA: Diagnosis not present

## 2018-11-24 DIAGNOSIS — F419 Anxiety disorder, unspecified: Secondary | ICD-10-CM | POA: Diagnosis not present

## 2018-11-24 LAB — POCT URINALYSIS DIPSTICK
Bilirubin, UA: NEGATIVE
Blood, UA: NEGATIVE
Glucose, UA: NEGATIVE
Ketones, UA: NEGATIVE
Nitrite, UA: NEGATIVE
Protein, UA: POSITIVE — AB
Spec Grav, UA: 1.01 (ref 1.010–1.025)
Urobilinogen, UA: 0.2 E.U./dL
pH, UA: 6 (ref 5.0–8.0)

## 2018-11-24 LAB — POCT URINE DRUG SCREEN
POC Amphetamine UR: NOT DETECTED
POC BENZODIAZEPINES UR: NOT DETECTED
POC Barbiturate UR: NOT DETECTED
POC Cocaine UR: NOT DETECTED
POC Marijuana UR: NOT DETECTED
POC Methadone UR: NOT DETECTED
POC Methamphetamine UR: NOT DETECTED
POC Opiate Ur: NOT DETECTED
POC Oxycodone UR: NOT DETECTED
POC PHENCYCLIDINE UR: NOT DETECTED
POC TRICYCLICS UR: NOT DETECTED

## 2018-11-24 MED ORDER — NITROFURANTOIN MONOHYD MACRO 100 MG PO CAPS: 100.0000 mg | ORAL_CAPSULE | Freq: Two times a day (BID) | ORAL | 0 refills | Status: DC

## 2018-11-24 MED ORDER — ALPRAZOLAM 0.25 MG PO TABS: 0.2500 mg | ORAL_TABLET | Freq: Every day | ORAL | 2 refills | Status: DC | PRN

## 2018-11-24 NOTE — Progress Notes (Signed)
[facility] [place], [place] [address]  Internal MEDICINE  Office Visit Note  Patient Name: Maria Sanchez  [id number]  [id number]  Date of Service: 11/24/2018  Chief Complaint  Patient presents with  . Urinary Tract Infection  . Altered Mental Status    pt is not sleeping at night , hearing things  . Anxiety    xananx refill     HPI Pt is here for a sick visit. Daughter in law with patient in exam room.  She reports that patient has been hearing things, and "not acting right" for a few weeks.  Typically when this happens she has a UTI.  Her confusion is getting worse, so she brought her in for testing.  Her urine shows a uti today.    Current Medication:  Outpatient Encounter Medications as of 11/24/2018  Medication Sig  . allopurinol (ZYLOPRIM) 100 MG tablet TAKE 2 TABLETS BY MOUTH AT BEDTIME AS NEEDED FOR GOUT  . ALPRAZolam (XANAX) 0.25 MG tablet Take 1 tablet (0.25 mg total) by mouth daily as needed (for nerves).  Marland Kitchen amLODipine (NORVASC) 5 MG tablet Take 1 tablet (5 mg total) by mouth daily.  Marland Kitchen aspirin EC 81 MG tablet Take 81 mg daily by mouth.  . escitalopram (LEXAPRO) 10 MG tablet TAKE 1 TABLET BY MOUTH EVERY DAY WITH SUPPER  . hydrALAZINE (APRESOLINE) 25 MG tablet TAKE 1 TABLET BY MOUTH 3 TIMES A DAY FOR BLOOD PRESSURE  . levothyroxine (SYNTHROID, LEVOTHROID) 112 MCG tablet Take 1 tablet (112 mcg total) by mouth daily before breakfast.  . metoprolol succinate (TOPROL-XL) 25 MG 24 hr tablet Take 1 tablet (25 mg total) by mouth at bedtime.  . pravastatin (PRAVACHOL) 10 MG tablet Take 1 tablet by mouth.  . vitamin B-12 (CYANOCOBALAMIN) 1000 MCG tablet Take 1,000 mcg by mouth daily.  . [DISCONTINUED] hydrochlorothiazide (HYDRODIURIL) 12.5 MG tablet Take 1 tablet (12.5 mg total) by mouth daily. (Patient not taking: Reported on 07/01/2018)  . [DISCONTINUED] nitrofurantoin, macrocrystal-monohydrate, (MACROBID) 100 MG capsule Take 1 capsule (100 mg total)  by mouth 2 (two) times daily. (Patient not taking: Reported on 11/24/2018)  . [DISCONTINUED] simvastatin (ZOCOR) 20 MG tablet TAKE 1 TABLET BY MOUTH DAILY AT BEDTIME (Patient not taking: Reported on 07/01/2018)  . [DISCONTINUED] traMADol (ULTRAM) 50 MG tablet TAKE 1 TABLET BY MOUTH FOUR TIMES A DAY AS NEEDED FOR PAIN (Patient not taking: Reported on 11/19/2017)   No facility-administered encounter medications on file as of 11/24/2018.       Medical History: Past Medical History:  Diagnosis Date  . Arthritis   . CAD (coronary artery disease)   . Diabetes mellitus without complication ([place])   . Hyperlipidemia   . Hypertension   . Renal artery stenosis ([place])   . Thyroid disease      Vital Signs: BP (!) 162/69   Pulse (!) 56   Temp 99 F (37.2 C)   Resp 16   Ht 5\' 2"  (1.575 m)   Wt 159 lb (72.1 kg)   SpO2 97%   BMI 29.08 kg/m    Review of Systems  Constitutional: Negative for chills, fatigue and unexpected weight change.  HENT: Negative for congestion, rhinorrhea, sneezing and sore throat.   Eyes: Negative for photophobia, pain and redness.  Respiratory: Negative for cough, chest tightness and shortness of breath.   Cardiovascular: Negative for chest pain and palpitations.  Gastrointestinal: Negative for abdominal pain, constipation, diarrhea, nausea and vomiting.  Endocrine: Negative.  Genitourinary: Negative for dysuria and frequency.  Musculoskeletal: Negative for arthralgias, back pain, joint swelling and neck pain.  Skin: Negative for rash.  Allergic/Immunologic: Negative.   Neurological: Negative for tremors and numbness.  Hematological: Negative for adenopathy. Does not bruise/bleed easily.  Psychiatric/Behavioral: Negative for behavioral problems and sleep disturbance. The patient is not nervous/anxious.     Physical Exam Vitals signs and nursing note reviewed.  Constitutional:      General: She is not in acute distress.    Appearance: She is well-developed.  She is not diaphoretic.  HENT:     Head: Normocephalic and atraumatic.     Mouth/Throat:     Pharynx: No oropharyngeal exudate.  Eyes:     Pupils: Pupils are equal, round, and reactive to light.  Neck:     Musculoskeletal: Normal range of motion and neck supple.     Thyroid: No thyromegaly.     Vascular: No JVD.     Trachea: No tracheal deviation.  Cardiovascular:     Rate and Rhythm: Normal rate and regular rhythm.     Heart sounds: Normal heart sounds. No murmur. No friction rub. No gallop.   Pulmonary:     Effort: Pulmonary effort is normal. No respiratory distress.     Breath sounds: Normal breath sounds. No wheezing or rales.  Chest:     Chest wall: No tenderness.  Abdominal:     Palpations: Abdomen is soft.     Tenderness: There is no abdominal tenderness. There is no guarding.  Musculoskeletal: Normal range of motion.  Lymphadenopathy:     Cervical: No cervical adenopathy.  Skin:    General: Skin is warm and dry.  Neurological:     Mental Status: She is alert and oriented to person, place, and time.     Cranial Nerves: No cranial nerve deficit.  Psychiatric:        Behavior: Behavior normal.        Thought Content: Thought content normal.        Judgment: Judgment normal.    Assessment/Plan: 1. Acute cystitis without hematuria Advised patient to take entire course of antibiotics as prescribed with food. Pt should return to clinic in 7-10 days if symptoms fail to improve or new symptoms develop.  - CULTURE, URINE COMPREHENSIVE - nitrofurantoin, macrocrystal-monohydrate, (MACROBID) 100 MG capsule; Take 1 capsule (100 mg total) by mouth 2 (two) times daily.  Dispense: 20 capsule; Refill: 0  2. High risk medication use - POCT Urine Drug Screen  3. Anxiety disorder, unspecified type .Reviewed risks and possible side effects associated with taking opiates, benzodiazepines and other CNS depressants. Combination of these could cause dizziness and drowsiness. Advised  patient not to drive or operate machinery when taking these medications, as patient's and other's life can be at risk and will have consequences. Patient verbalized understanding in this matter. Dependence and abuse for these drugs will be monitored closely. A Controlled substance policy and procedure is on file which allows [facility] to order a urine drug screen test at any visit. Patient understands and agrees with the plan - ALPRAZolam (XANAX) 0.25 MG tablet; Take 1 tablet (0.25 mg total) by mouth daily as needed (for nerves).  Dispense: 30 tablet; Refill: 2  4. Dysuria - POCT Urinalysis Dipstick  General Counseling: noraa pickeral understanding of the findings of todays visit and agrees with plan of treatment. I have discussed any further diagnostic evaluation that may be needed or ordered today. We also reviewed her medications  today. she has been encouraged to call the office with any questions or concerns that should arise related to todays visit.   Orders Placed This Encounter  Procedures  . CULTURE, URINE COMPREHENSIVE  . POCT Urinalysis Dipstick  . POCT Urine Drug Screen    No orders of the defined types were placed in this encounter.   Time spent: 15 Minutes  This patient was seen by Orson Gear AGNP-C in Collaboration with Dr Lavera Guise as a part of collaborative care agreement.  Kendell Bane AGNP-C Internal Medicine

## 2018-11-26 LAB — CULTURE, URINE COMPREHENSIVE

## 2018-12-14 DIAGNOSIS — I119 Hypertensive heart disease without heart failure: Secondary | ICD-10-CM | POA: Insufficient documentation

## 2018-12-14 DIAGNOSIS — I25810 Atherosclerosis of coronary artery bypass graft(s) without angina pectoris: Secondary | ICD-10-CM | POA: Diagnosis not present

## 2018-12-14 DIAGNOSIS — I6523 Occlusion and stenosis of bilateral carotid arteries: Secondary | ICD-10-CM | POA: Diagnosis not present

## 2018-12-14 DIAGNOSIS — I10 Essential (primary) hypertension: Secondary | ICD-10-CM | POA: Diagnosis not present

## 2018-12-14 DIAGNOSIS — I340 Nonrheumatic mitral (valve) insufficiency: Secondary | ICD-10-CM | POA: Diagnosis not present

## 2018-12-14 DIAGNOSIS — E782 Mixed hyperlipidemia: Secondary | ICD-10-CM | POA: Diagnosis not present

## 2019-01-13 ENCOUNTER — Ambulatory Visit (INDEPENDENT_AMBULATORY_CARE_PROVIDER_SITE_OTHER): Payer: Medicare Other | Admitting: Nurse Practitioner

## 2019-01-13 ENCOUNTER — Encounter: Payer: Self-pay | Admitting: Nurse Practitioner

## 2019-01-13 ENCOUNTER — Other Ambulatory Visit: Payer: Self-pay

## 2019-01-13 VITALS — BP 164/72 | HR 73 | Temp 97.5°F | Resp 16 | Ht 62.0 in | Wt 160.4 lb

## 2019-01-13 DIAGNOSIS — R300 Dysuria: Secondary | ICD-10-CM | POA: Diagnosis not present

## 2019-01-13 DIAGNOSIS — Z0001 Encounter for general adult medical examination with abnormal findings: Secondary | ICD-10-CM | POA: Diagnosis not present

## 2019-01-13 DIAGNOSIS — E039 Hypothyroidism, unspecified: Secondary | ICD-10-CM | POA: Diagnosis not present

## 2019-01-13 DIAGNOSIS — E782 Mixed hyperlipidemia: Secondary | ICD-10-CM | POA: Diagnosis not present

## 2019-01-13 DIAGNOSIS — I10 Essential (primary) hypertension: Secondary | ICD-10-CM | POA: Diagnosis not present

## 2019-01-13 DIAGNOSIS — M1A9XX0 Chronic gout, unspecified, without tophus (tophi): Secondary | ICD-10-CM

## 2019-01-13 LAB — POCT URINALYSIS DIPSTICK
Bilirubin, UA: NEGATIVE
Blood, UA: NEGATIVE
Glucose, UA: NEGATIVE
Ketones, UA: NEGATIVE
Leukocytes, UA: NEGATIVE
Nitrite, UA: NEGATIVE
Protein, UA: NEGATIVE
Spec Grav, UA: <=1.005 — AB (ref 1.010–1.025)
Urobilinogen, UA: 0.2 E.U./dL
pH, UA: 6.5 (ref 5.0–8.0)

## 2019-01-13 MED ORDER — ALLOPURINOL 100 MG PO TABS: ORAL_TABLET | ORAL | 3 refills | Status: DC

## 2019-01-13 MED ORDER — AMLODIPINE BESYLATE 5 MG PO TABS: 5.0000 mg | ORAL_TABLET | Freq: Every day | ORAL | 1 refills | Status: DC

## 2019-01-13 MED ORDER — METOPROLOL SUCCINATE ER 25 MG PO TB24: 25.0000 mg | ORAL_TABLET | Freq: Every day | ORAL | 1 refills | Status: DC

## 2019-01-13 MED ORDER — LEVOTHYROXINE SODIUM 112 MCG PO TABS: 112.0000 ug | ORAL_TABLET | Freq: Every day | ORAL | 3 refills | Status: DC

## 2019-01-13 MED ORDER — HYDRALAZINE HCL 25 MG PO TABS: ORAL_TABLET | ORAL | 2 refills | Status: DC

## 2019-01-13 MED ORDER — PRAVASTATIN SODIUM 10 MG PO TABS: 10.0000 mg | ORAL_TABLET | Freq: Every day | ORAL | 11 refills | Status: DC

## 2019-01-13 NOTE — Progress Notes (Signed)
[facility] [place], [place] [address]  Internal MEDICINE  Office Visit Note  Patient Name: Maria Sanchez  [id number]  [id number]  Date of Service: 01/23/2019   Pt is here for routine health maintenance examination  Chief Complaint  Patient presents with  . Annual Exam  . Hyperlipidemia  . Hypertension  . Hypothyroidism  . Labs Only    dr Nehemiah Massed said she needs bloodwork to get her pravastatin filled?      The patient is here for health maintenance exam. She states that she is doing well and has no concerns or complaints today. She is due to have routine, fasting labs drawn. She does see cardiologist, Dr. Nehemiah Massed, who would like her to have labs done prior to renewing prescription for pravastatin. She also needs to have refills for her routine medications.    Current Medication: Outpatient Encounter Medications as of 01/13/2019  Medication Sig  . allopurinol (ZYLOPRIM) 100 MG tablet TAKE 2 TABLETS BY MOUTH AT BEDTIME AS NEEDED FOR GOUT  . ALPRAZolam (XANAX) 0.25 MG tablet Take 1 tablet (0.25 mg total) by mouth daily as needed (for nerves).  Marland Kitchen amLODipine (NORVASC) 5 MG tablet Take 1 tablet (5 mg total) by mouth daily.  Marland Kitchen aspirin EC 81 MG tablet Take 81 mg daily by mouth.  . escitalopram (LEXAPRO) 10 MG tablet TAKE 1 TABLET BY MOUTH EVERY DAY WITH SUPPER  . levothyroxine (SYNTHROID) 112 MCG tablet Take 1 tablet (112 mcg total) by mouth daily before breakfast.  . metoprolol succinate (TOPROL-XL) 25 MG 24 hr tablet Take 1 tablet (25 mg total) by mouth at bedtime.  . Multiple Vitamins-Minerals (PRESERVISION AREDS 2) CAPS Take by mouth 2 (two) times daily.  . pravastatin (PRAVACHOL) 10 MG tablet Take 1 tablet (10 mg total) by mouth at bedtime.  . [DISCONTINUED] allopurinol (ZYLOPRIM) 100 MG tablet TAKE 2 TABLETS BY MOUTH AT BEDTIME AS NEEDED FOR GOUT  . [DISCONTINUED] amLODipine (NORVASC) 5 MG tablet Take 1 tablet (5 mg total) by mouth daily.  .  [DISCONTINUED] hydrALAZINE (APRESOLINE) 25 MG tablet TAKE 1 TABLET BY MOUTH 3 TIMES A DAY FOR BLOOD PRESSURE  . [DISCONTINUED] levothyroxine (SYNTHROID, LEVOTHROID) 112 MCG tablet Take 1 tablet (112 mcg total) by mouth daily before breakfast.  . [DISCONTINUED] metoprolol succinate (TOPROL-XL) 25 MG 24 hr tablet Take 1 tablet (25 mg total) by mouth at bedtime.  . [DISCONTINUED] pravastatin (PRAVACHOL) 10 MG tablet Take 1 tablet by mouth at bedtime.   . [DISCONTINUED] nitrofurantoin, macrocrystal-monohydrate, (MACROBID) 100 MG capsule Take 1 capsule (100 mg total) by mouth 2 (two) times daily. (Patient not taking: Reported on 01/13/2019)  . [DISCONTINUED] vitamin B-12 (CYANOCOBALAMIN) 1000 MCG tablet Take 1,000 mcg by mouth daily.   No facility-administered encounter medications on file as of 01/13/2019.     Surgical History: Past Surgical History:  Procedure Laterality Date  . ABDOMINAL HYSTERECTOMY    . BREAST SURGERY     benign  . CHOLECYSTECTOMY    . CORONARY ARTERY BYPASS GRAFT    . exicision salivary gland    . MITRAL VALVE REPLACEMENT (MVR)/CORONARY ARTERY BYPASS GRAFTING (CABG)    . posterolateral fusion    . stent placed in kidney Right   . thumb arthroplasty  06/20/2005    Medical History: Past Medical History:  Diagnosis Date  . Arthritis   . CAD (coronary artery disease)   . Diabetes mellitus without complication ([place])   . Hyperlipidemia   . Hypertension   .  Renal artery stenosis ([place])   . Thyroid disease     Family History: Family History  Problem Relation Age of Onset  . Colon cancer Mother   . Breast cancer Sister   . Colon cancer Brother       Review of Systems  Constitutional: Negative for chills, fatigue and unexpected weight change.  HENT: Negative for congestion, rhinorrhea, sneezing and sore throat.   Respiratory: Negative for cough, chest tightness, shortness of breath and wheezing.   Cardiovascular: Negative for chest pain and palpitations.   Gastrointestinal: Negative for abdominal pain, constipation, diarrhea, nausea and vomiting.  Endocrine: Negative for cold intolerance, heat intolerance, polydipsia and polyuria.  Musculoskeletal: Negative for arthralgias, back pain, joint swelling and neck pain.  Skin: Negative for rash.  Neurological: Negative for dizziness, tremors, numbness and headaches.  Hematological: Negative for adenopathy. Does not bruise/bleed easily.  Psychiatric/Behavioral: Negative for behavioral problems and sleep disturbance. The patient is nervous/anxious.      Vital Signs: BP (!) 164/72   Pulse 73   Temp (!) 97.5 F (36.4 C)   Resp 16   Ht 5\' 2"  (1.575 m)   Wt 160 lb 6.4 oz (72.8 kg)   SpO2 98%   BMI 29.34 kg/m    Physical Exam Vitals signs and nursing note reviewed.  Constitutional:      General: She is not in acute distress.    Appearance: Normal appearance. She is well-developed. She is not diaphoretic.  HENT:     Head: Normocephalic and atraumatic.     Mouth/Throat:     Pharynx: No oropharyngeal exudate.  Eyes:     Pupils: Pupils are equal, round, and reactive to light.  Neck:     Musculoskeletal: Normal range of motion and neck supple.     Thyroid: No thyromegaly.     Vascular: No JVD.     Trachea: No tracheal deviation.  Cardiovascular:     Rate and Rhythm: Normal rate and regular rhythm.     Pulses: Normal pulses.     Heart sounds: Normal heart sounds. No murmur. No friction rub. No gallop.   Pulmonary:     Effort: Pulmonary effort is normal. No respiratory distress.     Breath sounds: Normal breath sounds. No wheezing or rales.  Chest:     Chest wall: No tenderness.  Abdominal:     General: Bowel sounds are normal.     Palpations: Abdomen is soft.     Tenderness: There is no abdominal tenderness. There is no guarding.  Musculoskeletal: Normal range of motion.  Lymphadenopathy:     Cervical: No cervical adenopathy.  Skin:    General: Skin is warm and dry.   Neurological:     Mental Status: She is alert and oriented to person, place, and time. Mental status is at baseline.     Cranial Nerves: No cranial nerve deficit.  Psychiatric:        Behavior: Behavior normal.        Thought Content: Thought content normal.        Judgment: Judgment normal.    Depression screen [facility] 2/9 01/13/2019 11/24/2018 09/02/2018 07/01/2018 01/08/2018  Decreased Interest 0 0 0 0 0  Down, Depressed, Hopeless 1 0 0 0 0  PHQ - 2 Score 1 0 0 0 0  Altered sleeping - - - - -  Tired, decreased energy - - - - -  Change in appetite - - - - -  Feeling bad or failure about yourself  - - - - -  Trouble concentrating - - - - -  Moving slowly or fidgety/restless - - - - -  Suicidal thoughts - - - - -  PHQ-9 Score - - - - -  Difficult doing work/chores - - - - -    Functional Status Survey: Is the patient deaf or have difficulty hearing?: Yes(hearing aids) Does the patient have difficulty seeing, even when wearing glasses/contacts?: Yes Does the patient have difficulty concentrating, remembering, or making decisions?: Yes Does the patient have difficulty walking or climbing stairs?: Yes Does the patient have difficulty dressing or bathing?: No Does the patient have difficulty doing errands alone such as visiting a doctor's office or shopping?: Yes  No flowsheet data found.  Fall Risk  01/13/2019 11/24/2018 09/02/2018 07/01/2018 01/08/2018  Falls in the past year? 1 0 1 1 No  Comment - - - - -  Number falls in past yr: 0 0 0 0 -  Comment - - - - -  Injury with Fall? 0 0 0 0 -  Comment - - - knot on head leg and arm soreness -      LABS: Recent Results (from the past 2160 hour(s))  POCT Urinalysis Dipstick     Status: Abnormal   Collection Time: 11/24/18 10:57 AM  Result Value Ref Range   Color, UA     Clarity, UA     Glucose, UA Negative Negative   Bilirubin, UA negative    Ketones, UA negative    Spec Grav, UA 1.010 1.010 - 1.025   Blood, UA negative    pH, UA 6.0  5.0 - 8.0   Protein, UA Positive (A) Negative   Urobilinogen, UA 0.2 0.2 or 1.0 E.U./dL   Nitrite, UA negative    Leukocytes, UA Large (3+) (A) Negative   Appearance     Odor    CULTURE, URINE COMPREHENSIVE     Status: Abnormal   Collection Time: 11/24/18 10:58 AM   Specimen: Urine   URINE  Result Value Ref Range   Urine Culture, Comprehensive Final report (A)    Organism ID, Bacteria Escherichia coli (A)     Comment: Greater than 100,000 colony forming units per mL   ANTIMICROBIAL SUSCEPTIBILITY Comment     Comment:       ** S = Susceptible; I = Intermediate; R = Resistant **                    P = Positive; N = Negative             MICS are expressed in micrograms per mL    Antibiotic                 RSLT#1    RSLT#2    RSLT#3    RSLT#4 Amoxicillin/Clavulanic Acid    I Ampicillin                     R Cefazolin                      R Cefepime                       I Ceftriaxone                    R Cefuroxime                     R Ciprofloxacin  R Ertapenem                      S Gentamicin                     S Imipenem                       S Levofloxacin                   R Meropenem                      S Nitrofurantoin                 S Piperacillin/Tazobactam        S Tetracycline                   R Tobramycin                     S Trimethoprim/Sulfa             R   POCT Urine Drug Screen     Status: None   Collection Time: 11/24/18 11:03 AM  Result Value Ref Range   POC METHAMPHETAMINE UR None Detected None Detected   POC Opiate Ur None Detected None Detected   POC Barbiturate UR None Detected None Detected   POC Amphetamine UR None Detected None Detected   POC Oxycodone UR None Detected None Detected   POC Cocaine UR None Detected None Detected   POC Ecstasy UR     POC TRICYCLICS UR None Detected None Detected   POC PHENCYCLIDINE UR None Detected None Detected   POC MARIJUANA UR None Detected None Detected   POC METHADONE UR None Detected  None Detected   POC BENZODIAZEPINES UR None Detected None Detected   URINE TEMPERATURE     POC DRUG SCREEN OXIDANTS URINE     POC SPECIFIC GRAVITY URINE     POC PH URINE     Methylenedioxyamphetamine    UA/M w/rflx Culture, Routine     Status: None   Collection Time: 01/13/19 12:14 PM   Specimen: Urine   URINE  Result Value Ref Range   Specific Gravity, UA 1.008 1.005 - 1.030   pH, UA 6.5 5.0 - 7.5   Color, UA Yellow Yellow   Appearance Ur Clear Clear   Leukocytes,UA Negative Negative   Protein,UA Negative Negative/Trace   Glucose, UA Negative Negative   Ketones, UA Negative Negative   RBC, UA Negative Negative   Bilirubin, UA Negative Negative   Urobilinogen, Ur 0.2 0.2 - 1.0 mg/dL   Nitrite, UA Negative Negative   Microscopic Examination Comment     Comment: Microscopic follows if indicated.   Microscopic Examination See below:     Comment: Microscopic was indicated and was performed.   Urinalysis Reflex Comment     Comment: This specimen will not reflex to a Urine Culture.  Microscopic Examination     Status: None   Collection Time: 01/13/19 12:14 PM   URINE  Result Value Ref Range   WBC, UA 0-5 0 - 5 /hpf   RBC 0-2 0 - 2 /hpf   Epithelial Cells (non renal) 0-10 0 - 10 /hpf   Casts None seen None seen /lpf   Bacteria, UA Few None seen/Few  POCT Urinalysis Dipstick     Status: Abnormal   Collection  Time: 01/13/19  4:02 PM  Result Value Ref Range   Color, UA     Clarity, UA     Glucose, UA Negative Negative   Bilirubin, UA negative    Ketones, UA negative    Spec Grav, UA <=1.005 (A) 1.010 - 1.025   Blood, UA negative    pH, UA 6.5 5.0 - 8.0   Protein, UA Negative Negative   Urobilinogen, UA 0.2 0.2 or 1.0 E.U./dL   Nitrite, UA negative    Leukocytes, UA Negative Negative   Appearance     Odor     Assessment/Plan: 1. Encounter for general adult medical examination with abnormal findings Annual health maintenance exam today. Form given to have routine,  fasting labs drawn.   2. Essential (primary) hypertension Continue metoprolol and amlodipine as prescribed. Refills provided today.  - amLODipine (NORVASC) 5 MG tablet; Take 1 tablet (5 mg total) by mouth daily.  Dispense: 90 tablet; Refill: 1 - metoprolol succinate (TOPROL-XL) 25 MG 24 hr tablet; Take 1 tablet (25 mg total) by mouth at bedtime.  Dispense: 90 tablet; Refill: 1  3. Mixed hyperlipidemia Routine, fasting labs drawn. Continue pravastatin as prescribed.  - pravastatin (PRAVACHOL) 10 MG tablet; Take 1 tablet (10 mg total) by mouth at bedtime.  Dispense: 30 tablet; Refill: 11  4. Acquired hypothyroidism Check thyroid panel and adjust levothyroxine as prescribed  - levothyroxine (SYNTHROID) 112 MCG tablet; Take 1 tablet (112 mcg total) by mouth daily before breakfast.  Dispense: 90 tablet; Refill: 3  5. Chronic gout without tophus, unspecified cause, unspecified site - allopurinol (ZYLOPRIM) 100 MG tablet; TAKE 2 TABLETS BY MOUTH AT BEDTIME AS NEEDED FOR GOUT  Dispense: 180 tablet; Refill: 3  6. Dysuria - UA/M w/rflx Culture, Routine - POCT Urinalysis Dipstick  General Counseling: jailah cupit understanding of the findings of todays visit and agrees with plan of treatment. I have discussed any further diagnostic evaluation that may be needed or ordered today. We also reviewed her medications today. she has been encouraged to call the office with any questions or concerns that should arise related to todays visit.    Counseling:  This patient was seen by Leretha Pol FNP Collaboration with Dr Lavera Guise as a part of collaborative care agreement  Orders Placed This Encounter  Procedures  . Microscopic Examination  . UA/M w/rflx Culture, Routine  . POCT Urinalysis Dipstick    Meds ordered this encounter  Medications  . pravastatin (PRAVACHOL) 10 MG tablet    Sig: Take 1 tablet (10 mg total) by mouth at bedtime.    Dispense:  30 tablet    Refill:  11    Order  Specific Question:   Supervising Provider    Answer:   Lavera Guise [id number]  . amLODipine (NORVASC) 5 MG tablet    Sig: Take 1 tablet (5 mg total) by mouth daily.    Dispense:  90 tablet    Refill:  1    Order Specific Question:   Supervising Provider    Answer:   Lavera Guise [id number]  . levothyroxine (SYNTHROID) 112 MCG tablet    Sig: Take 1 tablet (112 mcg total) by mouth daily before breakfast.    Dispense:  90 tablet    Refill:  3    Order Specific Question:   Supervising Provider    Answer:   Lavera Guise [place]  . metoprolol succinate (TOPROL-XL) 25 MG 24 hr tablet    Sig: Take 1  tablet (25 mg total) by mouth at bedtime.    Dispense:  90 tablet    Refill:  1    Order Specific Question:   Supervising Provider    Answer:   Lavera Guise [id number]  . allopurinol (ZYLOPRIM) 100 MG tablet    Sig: TAKE 2 TABLETS BY MOUTH AT BEDTIME AS NEEDED FOR GOUT    Dispense:  180 tablet    Refill:  3    Order Specific Question:   Supervising Provider    Answer:   Lavera Guise [id number]    Time spent: Love, MD  Internal Medicine

## 2019-01-14 LAB — UA/M W/RFLX CULTURE, ROUTINE
Bilirubin, UA: NEGATIVE
Glucose, UA: NEGATIVE
Ketones, UA: NEGATIVE
Leukocytes,UA: NEGATIVE
Nitrite, UA: NEGATIVE
Protein,UA: NEGATIVE
RBC, UA: NEGATIVE
Specific Gravity, UA: 1.008 (ref 1.005–1.030)
Urobilinogen, Ur: 0.2 mg/dL (ref 0.2–1.0)
pH, UA: 6.5 (ref 5.0–7.5)

## 2019-01-14 LAB — MICROSCOPIC EXAMINATION: Casts: NONE SEEN /lpf

## 2019-01-23 DIAGNOSIS — M1A9XX0 Chronic gout, unspecified, without tophus (tophi): Secondary | ICD-10-CM | POA: Insufficient documentation

## 2019-01-31 ENCOUNTER — Other Ambulatory Visit: Payer: Self-pay | Admitting: Nurse Practitioner

## 2019-01-31 DIAGNOSIS — I10 Essential (primary) hypertension: Secondary | ICD-10-CM | POA: Diagnosis not present

## 2019-01-31 DIAGNOSIS — E039 Hypothyroidism, unspecified: Secondary | ICD-10-CM | POA: Diagnosis not present

## 2019-01-31 DIAGNOSIS — E559 Vitamin D deficiency, unspecified: Secondary | ICD-10-CM | POA: Diagnosis not present

## 2019-01-31 DIAGNOSIS — D519 Vitamin B12 deficiency anemia, unspecified: Secondary | ICD-10-CM | POA: Diagnosis not present

## 2019-01-31 DIAGNOSIS — Z0001 Encounter for general adult medical examination with abnormal findings: Secondary | ICD-10-CM | POA: Diagnosis not present

## 2019-02-01 LAB — COMPREHENSIVE METABOLIC PANEL
ALT: 12 IU/L (ref 0–32)
AST: 25 IU/L (ref 0–40)
Albumin/Globulin Ratio: 1.5 (ref 1.2–2.2)
Albumin: 4.1 g/dL (ref 3.6–4.6)
Alkaline Phosphatase: 77 IU/L (ref 39–117)
BUN/Creatinine Ratio: 16 (ref 12–28)
BUN: 19 mg/dL (ref 8–27)
Bilirubin Total: 0.3 mg/dL (ref 0.0–1.2)
CO2: 27 mmol/L (ref 20–29)
Calcium: 8.7 mg/dL (ref 8.7–10.3)
Chloride: 103 mmol/L (ref 96–106)
Creatinine, Ser: 1.21 mg/dL — ABNORMAL HIGH (ref 0.57–1.00)
GFR calc Af Amer: 47 mL/min/{1.73_m2} — ABNORMAL LOW (ref >59)
GFR calc non Af Amer: 41 mL/min/{1.73_m2} — ABNORMAL LOW (ref >59)
Globulin, Total: 2.7 g/dL (ref 1.5–4.5)
Glucose: 80 mg/dL (ref 65–99)
Potassium: 4.3 mmol/L (ref 3.5–5.2)
Sodium: 142 mmol/L (ref 134–144)
Total Protein: 6.8 g/dL (ref 6.0–8.5)

## 2019-02-01 LAB — CBC
Hematocrit: 36.1 % (ref 34.0–46.6)
Hemoglobin: 11.9 g/dL (ref 11.1–15.9)
MCH: 28.8 pg (ref 26.6–33.0)
MCHC: 33 g/dL (ref 31.5–35.7)
MCV: 87 fL (ref 79–97)
Platelets: 148 10*3/uL — ABNORMAL LOW (ref 150–450)
RBC: 4.13 x10E6/uL (ref 3.77–5.28)
RDW: 13.6 % (ref 11.7–15.4)
WBC: 6.3 10*3/uL (ref 3.4–10.8)

## 2019-02-01 LAB — LIPID PANEL W/O CHOL/HDL RATIO
Cholesterol, Total: 163 mg/dL (ref 100–199)
HDL: 50 mg/dL
LDL Chol Calc (NIH): 87 mg/dL (ref 0–99)
Triglycerides: 150 mg/dL — ABNORMAL HIGH (ref 0–149)
VLDL Cholesterol Cal: 26 mg/dL (ref 5–40)

## 2019-02-01 LAB — TSH: TSH: 0.434 u[IU]/mL — ABNORMAL LOW (ref 0.450–4.500)

## 2019-02-01 LAB — B12 AND FOLATE PANEL
Folate: 13.7 ng/mL
Vitamin B-12: 294 pg/mL (ref 232–1245)

## 2019-02-01 LAB — T4, FREE: Free T4: 1.75 ng/dL (ref 0.82–1.77)

## 2019-02-01 LAB — VITAMIN D 25 HYDROXY (VIT D DEFICIENCY, FRACTURES): Vit D, 25-Hydroxy: 20 ng/mL — ABNORMAL LOW (ref 30.0–100.0)

## 2019-02-08 NOTE — Progress Notes (Signed)
These labs were done since her last visit. I can do a televisit with her.

## 2019-02-09 ENCOUNTER — Telehealth: Payer: Self-pay

## 2019-02-09 ENCOUNTER — Other Ambulatory Visit: Payer: Self-pay

## 2019-02-09 NOTE — Telephone Encounter (Signed)
Left message and asked pt daughter to call back regarding pt lab results, per dr Humphrey Rolls she needs a televisit to review lab results. beth

## 2019-02-14 ENCOUNTER — Other Ambulatory Visit: Payer: Self-pay | Admitting: Adult Health

## 2019-02-14 DIAGNOSIS — F419 Anxiety disorder, unspecified: Secondary | ICD-10-CM

## 2019-02-14 NOTE — Telephone Encounter (Signed)
lov 9/17

## 2019-02-25 ENCOUNTER — Ambulatory Visit (INDEPENDENT_AMBULATORY_CARE_PROVIDER_SITE_OTHER): Payer: Medicare Other | Admitting: Nurse Practitioner

## 2019-02-25 ENCOUNTER — Encounter: Payer: Self-pay | Admitting: Nurse Practitioner

## 2019-02-25 DIAGNOSIS — I10 Essential (primary) hypertension: Secondary | ICD-10-CM | POA: Diagnosis not present

## 2019-02-25 DIAGNOSIS — E039 Hypothyroidism, unspecified: Secondary | ICD-10-CM

## 2019-02-25 DIAGNOSIS — E782 Mixed hyperlipidemia: Secondary | ICD-10-CM | POA: Diagnosis not present

## 2019-02-25 DIAGNOSIS — N1832 Chronic kidney disease, stage 3b: Secondary | ICD-10-CM

## 2019-02-25 NOTE — Progress Notes (Signed)
[facility] [place], [place] [address]  Internal MEDICINE  Telephone Visit  Patient Name: Maria Sanchez  [id number]  [id number]  Date of Service: 03/13/2019  I connected with the patient at 1130am by telephone and verified the patients identity using two identifiers.   I discussed the limitations, risks, security and privacy concerns of performing an evaluation and management service by telephone and the availability of in person appointments. I also discussed with the patient that there may be a patient responsible charge related to the service.  The patient expressed understanding and agrees to proceed.    Chief Complaint  Patient presents with  . Telephone Assessment    [id number]  . Telephone Screen  . Follow-up    labs follow up     The patient has been contacted via telephone for follow up visit due to concerns for spread of novel coronavirus. Today, she is follow up regarding lab results. Renal functions continue to be elevated, however, they are improved from the past several checks. Her daughter admits that her mother, the patient, does not drink enough water, and is working to increase her mom/s hydration status. TSH indicates mild hyperthyroid at 0.434. Free t4 levels are normal. Will continue to monitor this closely. Her vitamin d levels are also a bit low at 20.0. the patient's daughter will start her on OTC vitamin d3 supplement 2000iu every day. The patient has no concerns or complaints today.       Current Medication: Outpatient Encounter Medications as of 02/25/2019  Medication Sig  . allopurinol (ZYLOPRIM) 100 MG tablet TAKE 2 TABLETS BY MOUTH AT BEDTIME AS NEEDED FOR GOUT  . ALPRAZolam (XANAX) 0.25 MG tablet TAKE (1) TABLET BY MOUTH EVERY DAY AS NEEDED (FOR NERVES).  Marland Kitchen amLODipine (NORVASC) 5 MG tablet Take 1 tablet (5 mg total) by mouth daily.  Marland Kitchen aspirin EC 81 MG tablet Take 81 mg daily by mouth.  . escitalopram (LEXAPRO) 10 MG tablet  TAKE 1 TABLET BY MOUTH EVERY DAY WITH SUPPER  . hydrALAZINE (APRESOLINE) 25 MG tablet TAKE 1 TABLET BY MOUTH 3 TIMES A DAY FOR BLOOD PRESSURE  . levothyroxine (SYNTHROID) 112 MCG tablet Take 1 tablet (112 mcg total) by mouth daily before breakfast.  . metoprolol succinate (TOPROL-XL) 25 MG 24 hr tablet Take 1 tablet (25 mg total) by mouth at bedtime.  . Multiple Vitamins-Minerals (PRESERVISION AREDS 2) CAPS Take by mouth 2 (two) times daily.  . pravastatin (PRAVACHOL) 10 MG tablet Take 1 tablet (10 mg total) by mouth at bedtime.   No facility-administered encounter medications on file as of 02/25/2019.     Surgical History: Past Surgical History:  Procedure Laterality Date  . ABDOMINAL HYSTERECTOMY    . BREAST SURGERY     benign  . CHOLECYSTECTOMY    . CORONARY ARTERY BYPASS GRAFT    . exicision salivary gland    . MITRAL VALVE REPLACEMENT (MVR)/CORONARY ARTERY BYPASS GRAFTING (CABG)    . posterolateral fusion    . stent placed in kidney Right   . thumb arthroplasty  06/20/2005    Medical History: Past Medical History:  Diagnosis Date  . Arthritis   . CAD (coronary artery disease)   . Diabetes mellitus without complication ([place])   . Hyperlipidemia   . Hypertension   . Renal artery stenosis ([place])   . Thyroid disease     Family History: Family History  Problem Relation Age of Onset  . Colon cancer Mother   .  Breast cancer Sister   . Colon cancer Brother     Social History   Socioeconomic History  . Marital status: Widowed    Spouse name: Not on file  . Number of children: Not on file  . Years of education: Not on file  . Highest education level: Not on file  Occupational History  . Not on file  Social Needs  . Financial resource strain: Not on file  . Food insecurity    Worry: Not on file    Inability: Not on file  . Transportation needs    Medical: Not on file    Non-medical: Not on file  Tobacco Use  . Smoking status: Never Smoker  . Smokeless tobacco:  Never Used  Substance and Sexual Activity  . Alcohol use: No    Frequency: Never  . Drug use: No  . Sexual activity: Never  Lifestyle  . Physical activity    Days per week: Not on file    Minutes per session: Not on file  . Stress: Not on file  Relationships  . Social Herbalist on phone: Not on file    Gets together: Not on file    Attends religious service: Not on file    Active member of club or organization: Not on file    Attends meetings of clubs or organizations: Not on file    Relationship status: Not on file  . Intimate partner violence    Fear of current or ex partner: Not on file    Emotionally abused: Not on file    Physically abused: Not on file    Forced sexual activity: Not on file  Other Topics Concern  . Not on file  Social History Narrative  . Not on file      Review of Systems  Constitutional: Negative for chills, fatigue and unexpected weight change.  HENT: Negative for congestion, rhinorrhea, sneezing and sore throat.   Respiratory: Negative for cough, chest tightness, shortness of breath and wheezing.   Cardiovascular: Negative for chest pain and palpitations.  Gastrointestinal: Negative for abdominal pain, constipation, diarrhea, nausea and vomiting.  Endocrine: Negative for cold intolerance, heat intolerance, polydipsia and polyuria.  Musculoskeletal: Negative for arthralgias, back pain, joint swelling and neck pain.  Skin: Negative for rash.  Neurological: Negative for dizziness, tremors, numbness and headaches.  Hematological: Negative for adenopathy. Does not bruise/bleed easily.  Psychiatric/Behavioral: Negative for behavioral problems and sleep disturbance. The patient is nervous/anxious.     Vital Signs: There were no vitals taken for this visit.   Observation/Objective:   The patient is alert and oriented. She is pleasant and answers all questions appropriately. Breathing is non-labored. She is in no acute distress at this  time.    Assessment/Plan:  1. Essential (primary) hypertension bp has been stable. Continue bp medication as prescribed   2. Stage 3b chronic kidney disease GFR improving. Will continue to monitor closely.   3. Acquired hypothyroidism TSH indicating mild hyperthyroid at 0.434 with normal Free t4. Will monitor closely.   4. Mixed hyperlipidemia Stable. Continue pravastatin as prescribed    General Counseling: ajna matos understanding of the findings of today's phone visit and agrees with plan of treatment. I have discussed any further diagnostic evaluation that may be needed or ordered today. We also reviewed her medications today. she has been encouraged to call the office with any questions or concerns that should arise related to todays visit.  This patient was seen by  Leretha Pol FNP Collaboration with Dr Lavera Guise as a part of collaborative care agreement  Time spent: 25 Minutes    Dr Lavera Guise Internal medicine

## 2019-03-10 ENCOUNTER — Other Ambulatory Visit: Payer: Self-pay | Admitting: Nurse Practitioner

## 2019-03-10 DIAGNOSIS — N390 Urinary tract infection, site not specified: Secondary | ICD-10-CM

## 2019-03-10 MED ORDER — NITROFURANTOIN MONOHYD MACRO 100 MG PO CAPS: 100.0000 mg | ORAL_CAPSULE | Freq: Two times a day (BID) | ORAL | 0 refills | Status: DC

## 2019-03-10 NOTE — Progress Notes (Signed)
Patient having complaints of uti. Sent macrobid 100mg  bid for 7 days to [facility].

## 2019-05-17 ENCOUNTER — Telehealth: Payer: Self-pay

## 2019-05-17 ENCOUNTER — Other Ambulatory Visit: Payer: Self-pay

## 2019-05-17 ENCOUNTER — Other Ambulatory Visit: Payer: Self-pay | Admitting: Nurse Practitioner

## 2019-05-17 DIAGNOSIS — N390 Urinary tract infection, site not specified: Secondary | ICD-10-CM

## 2019-05-17 DIAGNOSIS — R300 Dysuria: Secondary | ICD-10-CM

## 2019-05-17 MED ORDER — NITROFURANTOIN MONOHYD MACRO 100 MG PO CAPS: 100.0000 mg | ORAL_CAPSULE | Freq: Two times a day (BID) | ORAL | 0 refills | Status: DC

## 2019-05-17 NOTE — Telephone Encounter (Signed)
Due to symptoms of uti, I sent patient prescription for macrobid 100mg  twice daily for 7 days.. will recheck her urine if she is here for visit next week.

## 2019-05-17 NOTE — Progress Notes (Signed)
Due to symptoms of uti, I sent patient prescription for macrobid 100mg  twice daily for 7 days.. will recheck her urine if she is here for visit next week.

## 2019-05-17 NOTE — Telephone Encounter (Signed)
Lmom to call back. 

## 2019-05-17 NOTE — Telephone Encounter (Signed)
Pt daughter advised we send antibiotic and recheck ua in next week again

## 2019-05-18 ENCOUNTER — Other Ambulatory Visit: Payer: Self-pay | Admitting: Nurse Practitioner

## 2019-05-18 DIAGNOSIS — R944 Abnormal results of kidney function studies: Secondary | ICD-10-CM | POA: Diagnosis not present

## 2019-05-18 DIAGNOSIS — E039 Hypothyroidism, unspecified: Secondary | ICD-10-CM | POA: Diagnosis not present

## 2019-05-18 DIAGNOSIS — R300 Dysuria: Secondary | ICD-10-CM | POA: Diagnosis not present

## 2019-05-19 ENCOUNTER — Telehealth: Payer: Self-pay

## 2019-05-19 LAB — BASIC METABOLIC PANEL
BUN/Creatinine Ratio: 19 (ref 12–28)
BUN: 22 mg/dL (ref 8–27)
CO2: 26 mmol/L (ref 20–29)
Calcium: 9 mg/dL (ref 8.7–10.3)
Chloride: 102 mmol/L (ref 96–106)
Creatinine, Ser: 1.17 mg/dL — ABNORMAL HIGH (ref 0.57–1.00)
GFR calc Af Amer: 49 mL/min/{1.73_m2} — ABNORMAL LOW (ref >59)
GFR calc non Af Amer: 42 mL/min/{1.73_m2} — ABNORMAL LOW (ref >59)
Glucose: 89 mg/dL (ref 65–99)
Potassium: 4.6 mmol/L (ref 3.5–5.2)
Sodium: 142 mmol/L (ref 134–144)

## 2019-05-19 LAB — T4, FREE: Free T4: 1.29 ng/dL (ref 0.82–1.77)

## 2019-05-19 LAB — TSH: TSH: 2.61 u[IU]/mL (ref 0.450–4.500)

## 2019-05-19 NOTE — Progress Notes (Signed)
Labs ok. Improved renal functions. Discuss with patient at visit 05/24/2019

## 2019-05-19 NOTE — Progress Notes (Signed)
Patient started on macrobid. Waiting on culture and sensitivity results.

## 2019-05-19 NOTE — Telephone Encounter (Signed)
CONFIRMED AND SCREENED FOR 05-24-19 OV PT DAUGHTER REQ IN OFFICE APPT.

## 2019-05-19 NOTE — Telephone Encounter (Signed)
LMOM TO CONFIRM 05-24-19 OV AS VIRTUAL.

## 2019-05-20 LAB — MICROSCOPIC EXAMINATION: Casts: NONE SEEN /lpf

## 2019-05-20 LAB — UA/M W/RFLX CULTURE, ROUTINE
Bilirubin, UA: NEGATIVE
Glucose, UA: NEGATIVE
Ketones, UA: NEGATIVE
Nitrite, UA: NEGATIVE
RBC, UA: NEGATIVE
Specific Gravity, UA: 1.013 (ref 1.005–1.030)
Urobilinogen, Ur: 0.2 mg/dL (ref 0.2–1.0)
pH, UA: 6 (ref 5.0–7.5)

## 2019-05-20 LAB — URINE CULTURE, REFLEX

## 2019-05-24 ENCOUNTER — Ambulatory Visit (INDEPENDENT_AMBULATORY_CARE_PROVIDER_SITE_OTHER): Payer: Medicare Other | Admitting: Nurse Practitioner

## 2019-05-24 ENCOUNTER — Encounter: Payer: Self-pay | Admitting: Nurse Practitioner

## 2019-05-24 ENCOUNTER — Other Ambulatory Visit: Payer: Self-pay

## 2019-05-24 VITALS — BP 148/80 | HR 57 | Temp 97.4°F | Resp 16 | Ht 62.0 in | Wt 151.8 lb

## 2019-05-24 DIAGNOSIS — E039 Hypothyroidism, unspecified: Secondary | ICD-10-CM | POA: Diagnosis not present

## 2019-05-24 DIAGNOSIS — G4701 Insomnia due to medical condition: Secondary | ICD-10-CM | POA: Diagnosis not present

## 2019-05-24 DIAGNOSIS — F419 Anxiety disorder, unspecified: Secondary | ICD-10-CM | POA: Diagnosis not present

## 2019-05-24 DIAGNOSIS — I10 Essential (primary) hypertension: Secondary | ICD-10-CM | POA: Diagnosis not present

## 2019-05-24 DIAGNOSIS — R300 Dysuria: Secondary | ICD-10-CM | POA: Diagnosis not present

## 2019-05-24 DIAGNOSIS — N183 Chronic kidney disease, stage 3 unspecified: Secondary | ICD-10-CM

## 2019-05-24 LAB — POCT URINALYSIS DIPSTICK
Bilirubin, UA: NEGATIVE
Blood, UA: NEGATIVE
Glucose, UA: NEGATIVE
Ketones, UA: NEGATIVE
Leukocytes, UA: NEGATIVE
Nitrite, UA: NEGATIVE
Protein, UA: NEGATIVE
Spec Grav, UA: 1.01 (ref 1.010–1.025)
Urobilinogen, UA: 0.2 E.U./dL
pH, UA: 6 (ref 5.0–8.0)

## 2019-05-24 MED ORDER — QUETIAPINE FUMARATE 25 MG PO TABS: 25.0000 mg | ORAL_TABLET | Freq: Every day | ORAL | 2 refills | Status: DC

## 2019-05-24 NOTE — Progress Notes (Signed)
[facility] [place], [place] [address]  Internal MEDICINE  Office Visit Note  Patient Name: Maria Sanchez  [id number]  [id number]  Date of Service: 05/25/2019  Chief Complaint  Patient presents with  . Hypertension  . Hyperlipidemia  . Hypothyroidism  . Altered Mental Status    short term memory  . Anxiety  . Depression  . Medication Refill    alprazolam, amlodipine (90), pravastatin (90),     The patient is here for follow up visit. She was recently treated for UTI with macrobid. Culture grew normal flora. She is feeling better. She is having a great deal of anxiety. This is much worse the patient's daughter states that patient acts like she doesn't care if she lives or dies. Gets very nervous about things that are routine. She is not sleeping well. Wakes up frequently during the night, even if she takes prescribed alprazolam. Causes her to be very sleepy during the day. If sitting still for even short period of time will nod off. She had labs done, prior to this visit. She has improving renal functions. Thyroid panel was normal. A repeat urine dipstick was negative for infection or abnormalities today.       Current Medication: Outpatient Encounter Medications as of 05/24/2019  Medication Sig  . allopurinol (ZYLOPRIM) 100 MG tablet TAKE 2 TABLETS BY MOUTH AT BEDTIME AS NEEDED FOR GOUT  . ALPRAZolam (XANAX) 0.25 MG tablet TAKE (1) TABLET BY MOUTH EVERY DAY AS NEEDED (FOR NERVES).  Marland Kitchen amLODipine (NORVASC) 5 MG tablet Take 1 tablet (5 mg total) by mouth daily.  Marland Kitchen aspirin EC 81 MG tablet Take 81 mg daily by mouth.  . escitalopram (LEXAPRO) 10 MG tablet TAKE 1 TABLET BY MOUTH EVERY DAY WITH SUPPER  . hydrALAZINE (APRESOLINE) 25 MG tablet TAKE 1 TABLET BY MOUTH 3 TIMES A DAY FOR BLOOD PRESSURE  . levothyroxine (SYNTHROID) 112 MCG tablet Take 1 tablet (112 mcg total) by mouth daily before breakfast.  . metoprolol succinate (TOPROL-XL) 25 MG 24 hr tablet Take  1 tablet (25 mg total) by mouth at bedtime.  . Multiple Vitamins-Minerals (PRESERVISION AREDS 2) CAPS Take by mouth 2 (two) times daily.  . nitrofurantoin, macrocrystal-monohydrate, (MACROBID) 100 MG capsule Take 1 capsule (100 mg total) by mouth 2 (two) times daily.  . pravastatin (PRAVACHOL) 10 MG tablet Take 1 tablet (10 mg total) by mouth at bedtime.  Marland Kitchen QUEtiapine (SEROQUEL) 25 MG tablet Take 1 tablet (25 mg total) by mouth at bedtime.   No facility-administered encounter medications on file as of 05/24/2019.    Surgical History: Past Surgical History:  Procedure Laterality Date  . ABDOMINAL HYSTERECTOMY    . BREAST SURGERY     benign  . CHOLECYSTECTOMY    . CORONARY ARTERY BYPASS GRAFT    . exicision salivary gland    . MITRAL VALVE REPLACEMENT (MVR)/CORONARY ARTERY BYPASS GRAFTING (CABG)    . posterolateral fusion    . stent placed in kidney Right   . thumb arthroplasty  06/20/2005    Medical History: Past Medical History:  Diagnosis Date  . Arthritis   . CAD (coronary artery disease)   . Diabetes mellitus without complication ([place])   . Hyperlipidemia   . Hypertension   . Renal artery stenosis ([place])   . Thyroid disease     Family History: Family History  Problem Relation Age of Onset  . Colon cancer Mother   . Breast cancer Sister   . Colon  cancer Brother     Social History   Socioeconomic History  . Marital status: Widowed    Spouse name: Not on file  . Number of children: Not on file  . Years of education: Not on file  . Highest education level: Not on file  Occupational History  . Not on file  Tobacco Use  . Smoking status: Never Smoker  . Smokeless tobacco: Never Used  Substance and Sexual Activity  . Alcohol use: No  . Drug use: No  . Sexual activity: Never  Other Topics Concern  . Not on file  Social History Narrative  . Not on file   Social Determinants of Health   Financial Resource Strain:   . Difficulty of Paying Living Expenses: Not  on file  Food Insecurity:   . Worried About Charity fundraiser in the Last Year: Not on file  . Ran Out of Food in the Last Year: Not on file  Transportation Needs:   . Lack of Transportation (Medical): Not on file  . Lack of Transportation (Non-Medical): Not on file  Physical Activity:   . Days of Exercise per Week: Not on file  . Minutes of Exercise per Session: Not on file  Stress:   . Feeling of Stress : Not on file  Social Connections:   . Frequency of Communication with Friends and Family: Not on file  . Frequency of Social Gatherings with Friends and Family: Not on file  . Attends Religious Services: Not on file  . Active Member of Clubs or Organizations: Not on file  . Attends Archivist Meetings: Not on file  . Marital Status: Not on file  Intimate Partner Violence:   . Fear of Current or Ex-Partner: Not on file  . Emotionally Abused: Not on file  . Physically Abused: Not on file  . Sexually Abused: Not on file      Review of Systems  Constitutional: Positive for fatigue. Negative for chills and unexpected weight change.  HENT: Negative for congestion, rhinorrhea, sneezing and sore throat.   Respiratory: Negative for cough, chest tightness, shortness of breath and wheezing.   Cardiovascular: Negative for chest pain and palpitations.  Gastrointestinal: Negative for abdominal pain, constipation, diarrhea, nausea and vomiting.  Endocrine: Negative for cold intolerance, heat intolerance, polydipsia and polyuria.  Genitourinary: Negative for dysuria, frequency and urgency.  Musculoskeletal: Negative for arthralgias, back pain, joint swelling and neck pain.  Skin: Negative for rash.  Neurological: Negative for dizziness, tremors, numbness and headaches.  Hematological: Negative for adenopathy. Does not bruise/bleed easily.  Psychiatric/Behavioral: Positive for sleep disturbance. Negative for behavioral problems. The patient is nervous/anxious.     Today's  Vitals   05/24/19 1514  BP: (!) 148/80  Pulse: (!) 57  Resp: 16  Temp: (!) 97.4 F (36.3 C)  SpO2: 98%  Weight: 151 lb 12.8 oz (68.9 kg)  Height: 5\' 2"  (1.575 m)   Body mass index is 27.76 kg/m.  Physical Exam Vitals and nursing note reviewed.  Constitutional:      General: She is not in acute distress.    Appearance: Normal appearance. She is well-developed. She is not diaphoretic.  HENT:     Head: Normocephalic and atraumatic.     Nose: Nose normal.     Mouth/Throat:     Pharynx: No oropharyngeal exudate.  Eyes:     Conjunctiva/sclera: Conjunctivae normal.     Pupils: Pupils are equal, round, and reactive to light.  Neck:  Thyroid: No thyromegaly.     Vascular: No carotid bruit or JVD.     Trachea: No tracheal deviation.  Cardiovascular:     Rate and Rhythm: Normal rate and regular rhythm.     Heart sounds: Normal heart sounds. No murmur. No friction rub. No gallop.   Pulmonary:     Effort: Pulmonary effort is normal. No respiratory distress.     Breath sounds: Normal breath sounds. No wheezing or rales.  Chest:     Chest wall: No tenderness.  Abdominal:     General: Bowel sounds are normal.     Palpations: Abdomen is soft.     Tenderness: There is no abdominal tenderness.  Genitourinary:    Comments: Urine sample negative for evidence of infection or other abnormalities.  Musculoskeletal:        General: Normal range of motion.     Cervical back: Normal range of motion and neck supple.  Lymphadenopathy:     Cervical: No cervical adenopathy.  Skin:    General: Skin is warm and dry.  Neurological:     Mental Status: She is alert and oriented to person, place, and time. Mental status is at baseline.     Cranial Nerves: No cranial nerve deficit.  Psychiatric:        Attention and Perception: Attention and perception normal.        Mood and Affect: Affect normal. Mood is anxious.        Speech: Speech normal.        Behavior: Behavior normal.         Thought Content: Thought content normal.        Cognition and Memory: Cognition and memory normal.        Judgment: Judgment normal.    Assessment/Plan: 1. Essential (primary) hypertension Stable. Continue bp medication as prescribed   2. Acquired hypothyroidism Reviewed thyroid panel which is normal. Continue levothyroxine at current dose   3. Stage 3 chronic kidney disease, unspecified whether stage 3a or 3b CKD Renal functions stable. Continue to monitor.   4. Anxiety disorder, unspecified type Continue lexapro daily. May take alprazolam 0.25mg  (1/2 tablet up to twice daily) if needed for acute anxiety.   5. Insomnia due to medical condition Add low dose seroquel to help with insomnia. Advised patient's daughter that she may start with 1/2 tablet and increase to whole tablet as needed and as indicated.  - QUEtiapine (SEROQUEL) 25 MG tablet; Take 1 tablet (25 mg total) by mouth at bedtime.  Dispense: 30 tablet; Refill: 2  6. Dysuria U/a negative for infection or other abnormalities.  - POCT Urinalysis Dipstick  General Counseling: lether damron understanding of the findings of todays visit and agrees with plan of treatment. I have discussed any further diagnostic evaluation that may be needed or ordered today. We also reviewed her medications today. she has been encouraged to call the office with any questions or concerns that should arise related to todays visit.  This patient was seen by Leretha Pol FNP Collaboration with Dr Lavera Guise as a part of collaborative care agreement  Orders Placed This Encounter  Procedures  . POCT Urinalysis Dipstick    Meds ordered this encounter  Medications  . QUEtiapine (SEROQUEL) 25 MG tablet    Sig: Take 1 tablet (25 mg total) by mouth at bedtime.    Dispense:  30 tablet    Refill:  2    Order Specific Question:   Supervising Provider  Answer:   Lavera Guise [[id number]]    Total time spent: 30 Minutes   Time spent includes  review of chart, medications, test results, and follow up plan with the patient.      Dr Lavera Guise Internal medicine

## 2019-05-25 DIAGNOSIS — G4701 Insomnia due to medical condition: Secondary | ICD-10-CM | POA: Insufficient documentation

## 2019-05-25 DIAGNOSIS — F419 Anxiety disorder, unspecified: Secondary | ICD-10-CM | POA: Insufficient documentation

## 2019-05-31 ENCOUNTER — Ambulatory Visit: Payer: Medicare Other | Admitting: Nurse Practitioner

## 2019-06-07 DIAGNOSIS — H353131 Nonexudative age-related macular degeneration, bilateral, early dry stage: Secondary | ICD-10-CM | POA: Diagnosis not present

## 2019-06-28 ENCOUNTER — Other Ambulatory Visit: Payer: Self-pay

## 2019-06-28 MED ORDER — HYDRALAZINE HCL 25 MG PO TABS: ORAL_TABLET | ORAL | 2 refills | Status: DC

## 2019-07-07 ENCOUNTER — Other Ambulatory Visit: Payer: Self-pay | Admitting: Nurse Practitioner

## 2019-07-07 ENCOUNTER — Telehealth: Payer: Self-pay

## 2019-07-07 DIAGNOSIS — N390 Urinary tract infection, site not specified: Secondary | ICD-10-CM

## 2019-07-07 MED ORDER — NITROFURANTOIN MONOHYD MACRO 100 MG PO CAPS: 100.0000 mg | ORAL_CAPSULE | Freq: Two times a day (BID) | ORAL | 0 refills | Status: DC

## 2019-07-07 NOTE — Progress Notes (Signed)
Sent prescription for macrobid bid for 10 days for uti. If no improvement, she should be seen. Thanks.

## 2019-07-07 NOTE — Telephone Encounter (Signed)
Sent prescription for macrobid bid for 10 days for uti. If no improvement, she should be seen. Thanks.

## 2019-07-07 NOTE — Telephone Encounter (Signed)
Pt daughter advised we send macrobid not feeling better she need to be seen

## 2019-07-11 ENCOUNTER — Other Ambulatory Visit: Payer: Self-pay

## 2019-07-11 DIAGNOSIS — G4701 Insomnia due to medical condition: Secondary | ICD-10-CM

## 2019-07-11 MED ORDER — QUETIAPINE FUMARATE 25 MG PO TABS: 25.0000 mg | ORAL_TABLET | Freq: Every day | ORAL | 2 refills | Status: DC

## 2019-07-14 ENCOUNTER — Other Ambulatory Visit: Payer: Self-pay

## 2019-07-14 ENCOUNTER — Ambulatory Visit: Payer: Medicare Other | Admitting: Nurse Practitioner

## 2019-07-14 DIAGNOSIS — I10 Essential (primary) hypertension: Secondary | ICD-10-CM

## 2019-07-14 MED ORDER — AMLODIPINE BESYLATE 5 MG PO TABS: 5.0000 mg | ORAL_TABLET | Freq: Every day | ORAL | 1 refills | Status: DC

## 2019-08-05 ENCOUNTER — Other Ambulatory Visit: Payer: Self-pay

## 2019-08-05 DIAGNOSIS — I10 Essential (primary) hypertension: Secondary | ICD-10-CM

## 2019-08-05 MED ORDER — METOPROLOL SUCCINATE ER 25 MG PO TB24: 25.0000 mg | ORAL_TABLET | Freq: Every day | ORAL | 1 refills | Status: DC

## 2019-08-10 ENCOUNTER — Telehealth: Payer: Self-pay

## 2019-08-10 NOTE — Telephone Encounter (Signed)
Confirmed and screened for 08-11-19 ov. 

## 2019-08-11 ENCOUNTER — Other Ambulatory Visit: Payer: Self-pay

## 2019-08-11 ENCOUNTER — Encounter: Payer: Self-pay | Admitting: Nurse Practitioner

## 2019-08-11 ENCOUNTER — Ambulatory Visit (INDEPENDENT_AMBULATORY_CARE_PROVIDER_SITE_OTHER): Payer: Medicare Other | Admitting: Nurse Practitioner

## 2019-08-11 VITALS — BP 162/82 | HR 63 | Temp 97.4°F | Resp 16 | Ht 62.0 in | Wt 147.6 lb

## 2019-08-11 DIAGNOSIS — F419 Anxiety disorder, unspecified: Secondary | ICD-10-CM | POA: Diagnosis not present

## 2019-08-11 DIAGNOSIS — I10 Essential (primary) hypertension: Secondary | ICD-10-CM | POA: Diagnosis not present

## 2019-08-11 DIAGNOSIS — R300 Dysuria: Secondary | ICD-10-CM | POA: Diagnosis not present

## 2019-08-11 DIAGNOSIS — N390 Urinary tract infection, site not specified: Secondary | ICD-10-CM | POA: Diagnosis not present

## 2019-08-11 LAB — POCT URINALYSIS DIPSTICK
Bilirubin, UA: NEGATIVE
Blood, UA: NEGATIVE
Glucose, UA: NEGATIVE
Ketones, UA: NEGATIVE
Nitrite, UA: NEGATIVE
Protein, UA: POSITIVE — AB
Spec Grav, UA: <=1.005 — AB (ref 1.010–1.025)
Urobilinogen, UA: 0.2 E.U./dL
pH, UA: 5 (ref 5.0–8.0)

## 2019-08-11 MED ORDER — ALPRAZOLAM 0.25 MG PO TABS: 0.2500 mg | ORAL_TABLET | Freq: Every evening | ORAL | 2 refills | Status: DC | PRN

## 2019-08-11 MED ORDER — NITROFURANTOIN MONOHYD MACRO 100 MG PO CAPS: 100.0000 mg | ORAL_CAPSULE | Freq: Two times a day (BID) | ORAL | 1 refills | Status: DC

## 2019-08-11 NOTE — Progress Notes (Signed)
[facility] [place], [place] [address]  Internal MEDICINE  Office Visit Note  Patient Name: Maria Sanchez  [id number]  [id number]  Date of Service: 08/21/2019  Chief Complaint  Patient presents with  . Hypertension  . Hyperlipidemia  . Hypothyroidism  . Anorexia    lost 14 pounds since september   . Fatigue  . Urinary Tract Infection  . Memory Loss    The patient is here for routine follow up. The patient has been having trouble with sleeping and increased mood changes. Gets very nervous about things that are routine. She is not sleeping well. Wakes up frequently during the night, even if she takes prescribed alprazolam. This generally happens when she has urinary tract infection developing. In fact, u/a is positive for large WBC and protein in her urine today. She denies abdominal pain, nausea, vomiting, or diarrhea. She denies fever or chills.       Current Medication: Outpatient Encounter Medications as of 08/11/2019  Medication Sig  . allopurinol (ZYLOPRIM) 100 MG tablet TAKE 2 TABLETS BY MOUTH AT BEDTIME AS NEEDED FOR GOUT  . ALPRAZolam (XANAX) 0.25 MG tablet Take 1 tablet (0.25 mg total) by mouth at bedtime as needed for anxiety.  Marland Kitchen amLODipine (NORVASC) 5 MG tablet Take 1 tablet (5 mg total) by mouth daily.  Marland Kitchen aspirin EC 81 MG tablet Take 81 mg daily by mouth.  . escitalopram (LEXAPRO) 10 MG tablet TAKE 1 TABLET BY MOUTH EVERY DAY WITH SUPPER  . hydrALAZINE (APRESOLINE) 25 MG tablet TAKE 1 TABLET BY MOUTH 3 TIMES A DAY FOR BLOOD PRESSURE  . levothyroxine (SYNTHROID) 112 MCG tablet Take 1 tablet (112 mcg total) by mouth daily before breakfast.  . metoprolol succinate (TOPROL-XL) 25 MG 24 hr tablet Take 1 tablet (25 mg total) by mouth at bedtime.  . Multiple Vitamins-Minerals (PRESERVISION AREDS 2) CAPS Take by mouth 2 (two) times daily.  . nitrofurantoin, macrocrystal-monohydrate, (MACROBID) 100 MG capsule Take 1 capsule (100 mg total) by mouth 2  (two) times daily.  . pravastatin (PRAVACHOL) 10 MG tablet Take 1 tablet (10 mg total) by mouth at bedtime.  Marland Kitchen QUEtiapine (SEROQUEL) 25 MG tablet Take 1 tablet (25 mg total) by mouth at bedtime.  . [DISCONTINUED] ALPRAZolam (XANAX) 0.25 MG tablet TAKE (1) TABLET BY MOUTH EVERY DAY AS NEEDED (FOR NERVES).  . [DISCONTINUED] nitrofurantoin, macrocrystal-monohydrate, (MACROBID) 100 MG capsule Take 1 capsule (100 mg total) by mouth 2 (two) times daily.   No facility-administered encounter medications on file as of 08/11/2019.    Surgical History: Past Surgical History:  Procedure Laterality Date  . ABDOMINAL HYSTERECTOMY    . BREAST SURGERY     benign  . CHOLECYSTECTOMY    . CORONARY ARTERY BYPASS GRAFT    . exicision salivary gland    . MITRAL VALVE REPLACEMENT (MVR)/CORONARY ARTERY BYPASS GRAFTING (CABG)    . posterolateral fusion    . stent placed in kidney Right   . thumb arthroplasty  06/20/2005    Medical History: Past Medical History:  Diagnosis Date  . Arthritis   . CAD (coronary artery disease)   . Diabetes mellitus without complication ([place])   . Hyperlipidemia   . Hypertension   . Renal artery stenosis ([place])   . Thyroid disease     Family History: Family History  Problem Relation Age of Onset  . Colon cancer Mother   . Breast cancer Sister   . Colon cancer Brother     Social  History   Socioeconomic History  . Marital status: Widowed    Spouse name: Not on file  . Number of children: Not on file  . Years of education: Not on file  . Highest education level: Not on file  Occupational History  . Not on file  Tobacco Use  . Smoking status: Never Smoker  . Smokeless tobacco: Never Used  Substance and Sexual Activity  . Alcohol use: No  . Drug use: No  . Sexual activity: Never  Other Topics Concern  . Not on file  Social History Narrative  . Not on file   Social Determinants of Health   Financial Resource Strain:   . Difficulty of Paying Living  Expenses:   Food Insecurity:   . Worried About Charity fundraiser in the Last Year:   . Arboriculturist in the Last Year:   Transportation Needs:   . Film/video editor (Medical):   Marland Kitchen Lack of Transportation (Non-Medical):   Physical Activity:   . Days of Exercise per Week:   . Minutes of Exercise per Session:   Stress:   . Feeling of Stress :   Social Connections:   . Frequency of Communication with Friends and Family:   . Frequency of Social Gatherings with Friends and Family:   . Attends Religious Services:   . Active Member of Clubs or Organizations:   . Attends Archivist Meetings:   Marland Kitchen Marital Status:   Intimate Partner Violence:   . Fear of Current or Ex-Partner:   . Emotionally Abused:   Marland Kitchen Physically Abused:   . Sexually Abused:       Review of Systems  Constitutional: Positive for fatigue. Negative for chills and unexpected weight change.  HENT: Negative for congestion, rhinorrhea, sneezing and sore throat.   Respiratory: Negative for cough, chest tightness, shortness of breath and wheezing.   Cardiovascular: Negative for chest pain and palpitations.  Gastrointestinal: Negative for abdominal pain, constipation, diarrhea, nausea and vomiting.  Endocrine: Negative for cold intolerance, heat intolerance, polydipsia and polyuria.  Genitourinary: Positive for dysuria and frequency. Negative for urgency.  Musculoskeletal: Negative for arthralgias, back pain, joint swelling and neck pain.  Skin: Negative for rash.  Allergic/Immunologic: Negative for environmental allergies.  Neurological: Negative for dizziness, tremors, numbness and headaches.  Hematological: Negative for adenopathy. Does not bruise/bleed easily.  Psychiatric/Behavioral: Positive for sleep disturbance. Negative for behavioral problems. The patient is nervous/anxious.     Today's Vitals   08/11/19 1513  BP: (!) 162/82  Pulse: 63  Resp: 16  Temp: (!) 97.4 F (36.3 C)  SpO2: 94%   Weight: 147 lb 9.6 oz (67 kg)  Height: 5\' 2"  (1.575 m)   Body mass index is 27 kg/m.  Physical Exam Vitals and nursing note reviewed.  Constitutional:      General: She is not in acute distress.    Appearance: Normal appearance. She is well-developed. She is not diaphoretic.  HENT:     Head: Normocephalic and atraumatic.     Nose: Nose normal.     Mouth/Throat:     Pharynx: No oropharyngeal exudate.  Eyes:     Conjunctiva/sclera: Conjunctivae normal.     Pupils: Pupils are equal, round, and reactive to light.  Neck:     Thyroid: No thyromegaly.     Vascular: No carotid bruit or JVD.     Trachea: No tracheal deviation.  Cardiovascular:     Rate and Rhythm: Normal rate and regular  rhythm.     Heart sounds: Normal heart sounds. No murmur. No friction rub. No gallop.   Pulmonary:     Effort: Pulmonary effort is normal. No respiratory distress.     Breath sounds: Normal breath sounds. No wheezing or rales.  Chest:     Chest wall: No tenderness.  Abdominal:     General: Bowel sounds are normal.     Palpations: Abdomen is soft.     Tenderness: There is no abdominal tenderness.  Genitourinary:    Comments: Urine sample positive for protein and large WBC Musculoskeletal:        General: Normal range of motion.     Cervical back: Normal range of motion and neck supple.  Lymphadenopathy:     Cervical: No cervical adenopathy.  Skin:    General: Skin is warm and dry.  Neurological:     Mental Status: She is alert and oriented to person, place, and time. Mental status is at baseline.     Cranial Nerves: No cranial nerve deficit.  Psychiatric:        Attention and Perception: Attention and perception normal.        Mood and Affect: Affect normal. Mood is anxious.        Speech: Speech normal.        Behavior: Behavior normal.        Thought Content: Thought content normal.        Cognition and Memory: Cognition and memory normal.        Judgment: Judgment normal.    Assessment/Plan: 1. Urinary tract infection without hematuria, site unspecified Start macrobid 100mg  bid for 10 days. Send urine sample for culture and sensitivity and adjust antibiotics as indicated.  - nitrofurantoin, macrocrystal-monohydrate, (MACROBID) 100 MG capsule; Take 1 capsule (100 mg total) by mouth 2 (two) times daily.  Dispense: 20 capsule; Refill: 1  2. Dysuria Treat at uti.  - POCT Urinalysis Dipstick - CULTURE, URINE COMPREHENSIVE  3. Essential (primary) hypertension Generally stable. Continue bp medication as prescribed   4. Anxiety disorder, unspecified type May continue to take alprazolam 0.25mg  at bedtime as needed for anxiety/insomnia.  - ALPRAZolam (XANAX) 0.25 MG tablet; Take 1 tablet (0.25 mg total) by mouth at bedtime as needed for anxiety.  Dispense: 30 tablet; Refill: 2  General Counseling: roselene deemer understanding of the findings of todays visit and agrees with plan of treatment. I have discussed any further diagnostic evaluation that may be needed or ordered today. We also reviewed her medications today. she has been encouraged to call the office with any questions or concerns that should arise related to todays visit.  This patient was seen by Leretha Pol FNP Collaboration with Dr Lavera Guise as a part of collaborative care agreement  Orders Placed This Encounter  Procedures  . CULTURE, URINE COMPREHENSIVE  . POCT Urinalysis Dipstick    Meds ordered this encounter  Medications  . ALPRAZolam (XANAX) 0.25 MG tablet    Sig: Take 1 tablet (0.25 mg total) by mouth at bedtime as needed for anxiety.    Dispense:  30 tablet    Refill:  2    Order Specific Question:   Supervising Provider    Answer:   Lavera Guise [id number]  . nitrofurantoin, macrocrystal-monohydrate, (MACROBID) 100 MG capsule    Sig: Take 1 capsule (100 mg total) by mouth 2 (two) times daily.    Dispense:  20 capsule    Refill:  1    Order  Specific Question:   Supervising  Provider    Answer:   Lavera Guise [id number]    Total time spent: 30 Minutes  Time spent includes review of chart, medications, test results, and follow up plan with the patient.      Dr Lavera Guise Internal medicine

## 2019-08-15 NOTE — Progress Notes (Signed)
Patient started on macrobid at time of visit

## 2019-08-16 LAB — CULTURE, URINE COMPREHENSIVE

## 2019-08-23 ENCOUNTER — Ambulatory Visit: Payer: Medicare Other | Admitting: Nurse Practitioner

## 2019-08-25 ENCOUNTER — Telehealth: Payer: Self-pay

## 2019-08-25 NOTE — Telephone Encounter (Signed)
Confirmed and screened for 08-30-19 ov. Advised to bring medications.

## 2019-08-30 ENCOUNTER — Encounter: Payer: Self-pay | Admitting: Internal Medicine

## 2019-08-30 ENCOUNTER — Ambulatory Visit (INDEPENDENT_AMBULATORY_CARE_PROVIDER_SITE_OTHER): Payer: Medicare Other | Admitting: Internal Medicine

## 2019-08-30 ENCOUNTER — Other Ambulatory Visit: Payer: Self-pay

## 2019-08-30 VITALS — BP 140/78 | HR 58 | Temp 97.9°F | Resp 16 | Ht 62.0 in | Wt 148.0 lb

## 2019-08-30 DIAGNOSIS — R300 Dysuria: Secondary | ICD-10-CM

## 2019-08-30 DIAGNOSIS — N390 Urinary tract infection, site not specified: Secondary | ICD-10-CM

## 2019-08-30 DIAGNOSIS — I10 Essential (primary) hypertension: Secondary | ICD-10-CM

## 2019-08-30 DIAGNOSIS — F324 Major depressive disorder, single episode, in partial remission: Secondary | ICD-10-CM | POA: Diagnosis not present

## 2019-08-30 DIAGNOSIS — R441 Visual hallucinations: Secondary | ICD-10-CM | POA: Diagnosis not present

## 2019-08-30 LAB — POCT URINALYSIS DIPSTICK
Bilirubin, UA: NEGATIVE
Blood, UA: NEGATIVE
Glucose, UA: NEGATIVE
Ketones, UA: NEGATIVE
Nitrite, UA: POSITIVE
Protein, UA: POSITIVE — AB
Spec Grav, UA: <=1.005 — AB (ref 1.010–1.025)
Urobilinogen, UA: 0.2 E.U./dL
pH, UA: 6 (ref 5.0–8.0)

## 2019-08-30 MED ORDER — CIPROFLOXACIN HCL 500 MG PO TABS: 500.0000 mg | ORAL_TABLET | Freq: Two times a day (BID) | ORAL | 0 refills | Status: DC

## 2019-08-30 NOTE — Progress Notes (Signed)
[facility] [place], [place] [address]  Internal MEDICINE  Office Visit Note  Patient Name: Maria Sanchez  [id number]  [id number]  Date of Service: 09/03/2019  Chief Complaint  Patient presents with  . Follow-up    pt hearing people  talking at daytime   . Urinary Tract Infection  . Hypertension    HPI Pt is here with her daughter with complaints of excessive urination. Pt continues to have recurrent UTI. Daughter notices that her hallucinations get worse whenever she has a UTI. Denies any fever or chills. Sleeps well with Zyprexa. It was started recently. BP is under good control. Her depression is under good control  Current Medication: Outpatient Encounter Medications as of 08/30/2019  Medication Sig  . allopurinol (ZYLOPRIM) 100 MG tablet TAKE 2 TABLETS BY MOUTH AT BEDTIME AS NEEDED FOR GOUT  . ALPRAZolam (XANAX) 0.25 MG tablet Take 1 tablet (0.25 mg total) by mouth at bedtime as needed for anxiety.  Marland Kitchen amLODipine (NORVASC) 5 MG tablet Take 1 tablet (5 mg total) by mouth daily.  Marland Kitchen aspirin EC 81 MG tablet Take 81 mg daily by mouth.  . escitalopram (LEXAPRO) 10 MG tablet TAKE 1 TABLET BY MOUTH EVERY DAY WITH SUPPER  . hydrALAZINE (APRESOLINE) 25 MG tablet TAKE 1 TABLET BY MOUTH 3 TIMES A DAY FOR BLOOD PRESSURE  . levothyroxine (SYNTHROID) 112 MCG tablet Take 1 tablet (112 mcg total) by mouth daily before breakfast.  . metoprolol succinate (TOPROL-XL) 25 MG 24 hr tablet Take 1 tablet (25 mg total) by mouth at bedtime.  . Multiple Vitamins-Minerals (PRESERVISION AREDS 2) CAPS Take by mouth 2 (two) times daily.  . pravastatin (PRAVACHOL) 10 MG tablet Take 1 tablet (10 mg total) by mouth at bedtime.  Marland Kitchen QUEtiapine (SEROQUEL) 25 MG tablet Take 1 tablet (25 mg total) by mouth at bedtime.  . ciprofloxacin (CIPRO) 500 MG tablet Take 1 tablet (500 mg total) by mouth 2 (two) times daily. For 3 weeks for chronic UTI  . nitrofurantoin, macrocrystal-monohydrate,  (MACROBID) 100 MG capsule Take 1 capsule (100 mg total) by mouth 2 (two) times daily. (Patient not taking: Reported on 08/30/2019)  . [DISCONTINUED] ciprofloxacin (CIPRO) 500 MG tablet Take 1 tablet (500 mg total) by mouth 2 (two) times daily.   No facility-administered encounter medications on file as of 08/30/2019.    Surgical History: Past Surgical History:  Procedure Laterality Date  . ABDOMINAL HYSTERECTOMY    . BREAST SURGERY     benign  . CHOLECYSTECTOMY    . CORONARY ARTERY BYPASS GRAFT    . exicision salivary gland    . MITRAL VALVE REPLACEMENT (MVR)/CORONARY ARTERY BYPASS GRAFTING (CABG)    . posterolateral fusion    . stent placed in kidney Right   . thumb arthroplasty  06/20/2005    Medical History: Past Medical History:  Diagnosis Date  . Arthritis   . CAD (coronary artery disease)   . Hyperlipidemia   . Hypertension   . Renal artery stenosis ([place])   . Thyroid disease     Family History: Family History  Problem Relation Age of Onset  . Colon cancer Mother   . Breast cancer Sister   . Colon cancer Brother     Social History   Socioeconomic History  . Marital status: Widowed    Spouse name: Not on file  . Number of children: Not on file  . Years of education: Not on file  . Highest education level: Not on  file  Occupational History  . Not on file  Tobacco Use  . Smoking status: Never Smoker  . Smokeless tobacco: Never Used  Substance and Sexual Activity  . Alcohol use: No  . Drug use: No  . Sexual activity: Never  Other Topics Concern  . Not on file  Social History Narrative  . Not on file   Social Determinants of Health   Financial Resource Strain:   . Difficulty of Paying Living Expenses:   Food Insecurity:   . Worried About Charity fundraiser in the Last Year:   . Arboriculturist in the Last Year:   Transportation Needs:   . Film/video editor (Medical):   Marland Kitchen Lack of Transportation (Non-Medical):   Physical Activity:   . Days of  Exercise per Week:   . Minutes of Exercise per Session:   Stress:   . Feeling of Stress :   Social Connections:   . Frequency of Communication with Friends and Family:   . Frequency of Social Gatherings with Friends and Family:   . Attends Religious Services:   . Active Member of Clubs or Organizations:   . Attends Archivist Meetings:   Marland Kitchen Marital Status:   Intimate Partner Violence:   . Fear of Current or Ex-Partner:   . Emotionally Abused:   Marland Kitchen Physically Abused:   . Sexually Abused:       Review of Systems  Constitutional: Negative for chills, diaphoresis and fatigue.  HENT: Negative for ear pain, postnasal drip and sinus pressure.   Eyes: Negative for photophobia, discharge, redness, itching and visual disturbance.  Respiratory: Negative for cough, shortness of breath and wheezing.   Cardiovascular: Negative for chest pain, palpitations and leg swelling.  Gastrointestinal: Negative for abdominal pain, constipation, diarrhea, nausea and vomiting.  Genitourinary: Positive for difficulty urinating and dysuria. Negative for flank pain.  Musculoskeletal: Negative for arthralgias, back pain, gait problem and neck pain.  Skin: Negative for color change.  Allergic/Immunologic: Negative for environmental allergies and food allergies.  Neurological: Negative for dizziness and headaches.  Hematological: Does not bruise/bleed easily.  Psychiatric/Behavioral: Positive for confusion and hallucinations. Negative for agitation. Behavioral problem: depression.    Vital Signs: BP 140/78   Pulse (!) 58   Temp 97.9 F (36.6 C)   Resp 16   Ht 5\' 2"  (1.575 m)   Wt 148 lb (67.1 kg)   SpO2 97%   BMI 27.07 kg/m    Physical Exam Constitutional:      General: She is not in acute distress.    Appearance: She is well-developed. She is not diaphoretic.  HENT:     Head: Normocephalic and atraumatic.     Mouth/Throat:     Pharynx: No oropharyngeal exudate.  Eyes:     Pupils:  Pupils are equal, round, and reactive to light.  Neck:     Thyroid: No thyromegaly.     Vascular: No JVD.     Trachea: No tracheal deviation.  Cardiovascular:     Rate and Rhythm: Normal rate and regular rhythm.     Heart sounds: Normal heart sounds. No murmur. No friction rub. No gallop.   Pulmonary:     Effort: Pulmonary effort is normal. No respiratory distress.     Breath sounds: No wheezing or rales.  Chest:     Chest wall: No tenderness.  Abdominal:     General: Bowel sounds are normal.     Palpations: Abdomen is soft.  Musculoskeletal:  General: Normal range of motion.     Cervical back: Normal range of motion and neck supple.  Lymphadenopathy:     Cervical: No cervical adenopathy.  Skin:    General: Skin is warm and dry.  Neurological:     Mental Status: She is alert and oriented to person, place, and time.     Cranial Nerves: No cranial nerve deficit.  Psychiatric:        Behavior: Behavior normal.        Thought Content: Thought content normal.        Judgment: Judgment normal.    Assessment/Plan: 1. Recurrent UTI (urinary tract infection) - Pt continues to have repeated infection, she has only one kidney, might need further diagnostics, renal and bladder u/s  - POCT Urinalysis Dipstick - CULTURE, URINE COMPREHENSIVE - ciprofloxacin (CIPRO) 500 MG tablet; Take 1 tablet (500 mg total) by mouth 2 (two) times daily. For 3 weeks for chronic UTI  Dispense: 42 tablet; Refill: 0  2. Hallucinations, visual - Will monitor for now, if no improvement might need formal evaluation for alzheimer from psych, continue Zyprexa for now   3. Essential hypertension - Continue to monitor   4. Depression, major, single episode, in partial remission ([place]) - Continue Lexapro, might need to see Aricept  General Counseling: ellenore hammons understanding of the findings of todays visit and agrees with plan of treatment. I have discussed any further diagnostic evaluation that  may be needed or ordered today. We also reviewed her medications today. she has been encouraged to call the office with any questions or concerns that should arise related to todays visit.  Orders Placed This Encounter  Procedures  . CULTURE, URINE COMPREHENSIVE  . US Renal  . [place], retroperitnl abd,  ltd  . POCT Urinalysis Dipstick    Meds ordered this encounter  Medications  . DISCONTD: ciprofloxacin (CIPRO) 500 MG tablet    Sig: Take 1 tablet (500 mg total) by mouth 2 (two) times daily.    Dispense:  20 tablet    Refill:  0  . ciprofloxacin (CIPRO) 500 MG tablet    Sig: Take 1 tablet (500 mg total) by mouth 2 (two) times daily. For 3 weeks for chronic UTI    Dispense:  42 tablet    Refill:  0    Total time spent:30 Minutes Time spent includes review of chart, medications, test results, and follow up plan with the patient.   Dr Lavera Guise Internal medicine

## 2019-09-04 LAB — CULTURE, URINE COMPREHENSIVE

## 2019-09-04 NOTE — Progress Notes (Signed)
Pt s on cipro

## 2019-09-05 ENCOUNTER — Telehealth: Payer: Self-pay

## 2019-09-05 NOTE — Telephone Encounter (Signed)
-----   Message from Lavera Guise, MD sent at 09/04/2019 11:45 AM EDT ----- Pt s on cipro

## 2019-09-05 NOTE — Telephone Encounter (Signed)
Pt daughter was notified that culture came back and to continue use of cipro.

## 2019-09-16 ENCOUNTER — Ambulatory Visit: Payer: Medicare Other | Admitting: Nurse Practitioner

## 2019-09-19 ENCOUNTER — Telehealth: Payer: Self-pay

## 2019-09-19 NOTE — Telephone Encounter (Signed)
Left a message and advised pt of ultrasound dr Humphrey Rolls wanted to order [facility]

## 2019-09-30 ENCOUNTER — Other Ambulatory Visit: Payer: Medicare Other

## 2019-10-06 ENCOUNTER — Other Ambulatory Visit: Payer: Self-pay

## 2019-10-06 DIAGNOSIS — G4701 Insomnia due to medical condition: Secondary | ICD-10-CM

## 2019-10-06 MED ORDER — QUETIAPINE FUMARATE 25 MG PO TABS: 25.0000 mg | ORAL_TABLET | Freq: Every day | ORAL | 2 refills | Status: DC

## 2019-10-25 ENCOUNTER — Other Ambulatory Visit: Payer: Self-pay

## 2019-10-25 MED ORDER — ESCITALOPRAM OXALATE 10 MG PO TABS: ORAL_TABLET | ORAL | 3 refills | Status: DC

## 2019-11-02 ENCOUNTER — Telehealth: Payer: Self-pay

## 2019-11-02 NOTE — Telephone Encounter (Signed)
Confirmed patient ultrasound appt with pt daughter

## 2019-11-04 ENCOUNTER — Other Ambulatory Visit: Payer: Self-pay

## 2019-11-04 ENCOUNTER — Ambulatory Visit (INDEPENDENT_AMBULATORY_CARE_PROVIDER_SITE_OTHER): Payer: Medicare Other

## 2019-11-04 DIAGNOSIS — I10 Essential (primary) hypertension: Secondary | ICD-10-CM

## 2019-11-04 DIAGNOSIS — N390 Urinary tract infection, site not specified: Secondary | ICD-10-CM

## 2019-11-08 ENCOUNTER — Telehealth: Payer: Self-pay

## 2019-11-08 NOTE — Telephone Encounter (Signed)
CONFIRMED PATIENT APPT. -AR °

## 2019-11-10 ENCOUNTER — Ambulatory Visit (INDEPENDENT_AMBULATORY_CARE_PROVIDER_SITE_OTHER): Payer: Medicare Other | Admitting: Nurse Practitioner

## 2019-11-10 ENCOUNTER — Other Ambulatory Visit: Payer: Self-pay

## 2019-11-10 ENCOUNTER — Encounter: Payer: Self-pay | Admitting: Nurse Practitioner

## 2019-11-10 VITALS — BP 175/58 | HR 60 | Temp 97.3°F | Resp 16 | Ht 62.0 in | Wt 145.6 lb

## 2019-11-10 DIAGNOSIS — F419 Anxiety disorder, unspecified: Secondary | ICD-10-CM

## 2019-11-10 DIAGNOSIS — R300 Dysuria: Secondary | ICD-10-CM

## 2019-11-10 DIAGNOSIS — N390 Urinary tract infection, site not specified: Secondary | ICD-10-CM | POA: Diagnosis not present

## 2019-11-10 DIAGNOSIS — E1165 Type 2 diabetes mellitus with hyperglycemia: Secondary | ICD-10-CM | POA: Diagnosis not present

## 2019-11-10 DIAGNOSIS — E119 Type 2 diabetes mellitus without complications: Secondary | ICD-10-CM | POA: Diagnosis not present

## 2019-11-10 DIAGNOSIS — F324 Major depressive disorder, single episode, in partial remission: Secondary | ICD-10-CM

## 2019-11-10 LAB — POCT URINALYSIS DIPSTICK
Bilirubin, UA: NEGATIVE
Glucose, UA: NEGATIVE
Ketones, UA: NEGATIVE
Nitrite, UA: NEGATIVE
Protein, UA: NEGATIVE
Spec Grav, UA: 1.01 (ref 1.010–1.025)
Urobilinogen, UA: 0.2 E.U./dL
pH, UA: 7 (ref 5.0–8.0)

## 2019-11-10 LAB — POCT GLYCOSYLATED HEMOGLOBIN (HGB A1C): Hemoglobin A1C: 5.2 % (ref 4.0–5.6)

## 2019-11-10 MED ORDER — NITROFURANTOIN MONOHYD MACRO 100 MG PO CAPS: 100.0000 mg | ORAL_CAPSULE | Freq: Every day | ORAL | 3 refills | Status: DC

## 2019-11-10 MED ORDER — ALPRAZOLAM 0.25 MG PO TABS: 0.2500 mg | ORAL_TABLET | Freq: Every evening | ORAL | 2 refills | Status: DC | PRN

## 2019-11-10 MED ORDER — CIPROFLOXACIN HCL 500 MG PO TABS: 500.0000 mg | ORAL_TABLET | Freq: Two times a day (BID) | ORAL | 0 refills | Status: DC

## 2019-11-10 NOTE — Progress Notes (Signed)
[facility] [place], [place] [address]  Internal MEDICINE  Office Visit Note  Patient Name: Maria Sanchez  [id number]  [id number]  Date of Service: 11/20/2019  Chief Complaint  Patient presents with  . Follow-up  . Urinary Tract Infection  . Hyperlipidemia  . Hypertension  . Quality Metric Gaps    TDAP    The patient is here for routine follow up. Blood sugars are doing well and well managed by herself and her family.  The patient has been having trouble with sleeping and increased mood changes again. Gets very nervous about things that are routine. She is not sleeping well. Wakes up frequently during the night, even if she takes prescribed alprazolam. This generally happens when she has urinary tract infection developing. In fact, u/a is positive for large WBC and trace of blood. She denies abdominal pain, nausea, vomiting, or diarrhea. She denies fever or chills.        Current Medication: Outpatient Encounter Medications as of 11/10/2019  Medication Sig  . allopurinol (ZYLOPRIM) 100 MG tablet TAKE 2 TABLETS BY MOUTH AT BEDTIME AS NEEDED FOR GOUT  . ALPRAZolam (XANAX) 0.25 MG tablet Take 1 tablet (0.25 mg total) by mouth at bedtime as needed for anxiety.  Marland Kitchen amLODipine (NORVASC) 5 MG tablet Take 1 tablet (5 mg total) by mouth daily.  Marland Kitchen aspirin EC 81 MG tablet Take 81 mg daily by mouth.  . ciprofloxacin (CIPRO) 500 MG tablet Take 1 tablet (500 mg total) by mouth 2 (two) times daily. Take 1 tablet po BID for 7 days  . escitalopram (LEXAPRO) 10 MG tablet TAKE 1 TABLET BY MOUTH EVERY DAY WITH SUPPER  . hydrALAZINE (APRESOLINE) 25 MG tablet TAKE 1 TABLET BY MOUTH 3 TIMES A DAY FOR BLOOD PRESSURE  . levothyroxine (SYNTHROID) 112 MCG tablet Take 1 tablet (112 mcg total) by mouth daily before breakfast.  . metoprolol succinate (TOPROL-XL) 25 MG 24 hr tablet Take 1 tablet (25 mg total) by mouth at bedtime.  . Multiple Vitamins-Minerals (PRESERVISION AREDS 2)  CAPS Take by mouth 2 (two) times daily.  . nitrofurantoin, macrocrystal-monohydrate, (MACROBID) 100 MG capsule Take 1 capsule (100 mg total) by mouth daily.  . pravastatin (PRAVACHOL) 10 MG tablet Take 1 tablet (10 mg total) by mouth at bedtime.  Marland Kitchen QUEtiapine (SEROQUEL) 25 MG tablet Take 1 tablet (25 mg total) by mouth at bedtime.  . [DISCONTINUED] ALPRAZolam (XANAX) 0.25 MG tablet Take 1 tablet (0.25 mg total) by mouth at bedtime as needed for anxiety.  . [DISCONTINUED] ciprofloxacin (CIPRO) 500 MG tablet Take 1 tablet (500 mg total) by mouth 2 (two) times daily. For 3 weeks for chronic UTI  . [DISCONTINUED] nitrofurantoin, macrocrystal-monohydrate, (MACROBID) 100 MG capsule Take 1 capsule (100 mg total) by mouth 2 (two) times daily.   No facility-administered encounter medications on file as of 11/10/2019.    Surgical History: Past Surgical History:  Procedure Laterality Date  . ABDOMINAL HYSTERECTOMY    . BREAST SURGERY     benign  . CHOLECYSTECTOMY    . CORONARY ARTERY BYPASS GRAFT    . exicision salivary gland    . MITRAL VALVE REPLACEMENT (MVR)/CORONARY ARTERY BYPASS GRAFTING (CABG)    . posterolateral fusion    . stent placed in kidney Right   . thumb arthroplasty  06/20/2005    Medical History: Past Medical History:  Diagnosis Date  . Arthritis   . CAD (coronary artery disease)   . Hyperlipidemia   .  Hypertension   . Renal artery stenosis ([place])   . Thyroid disease     Family History: Family History  Problem Relation Age of Onset  . Colon cancer Mother   . Breast cancer Sister   . Colon cancer Brother     Social History   Socioeconomic History  . Marital status: Widowed    Spouse name: Not on file  . Number of children: Not on file  . Years of education: Not on file  . Highest education level: Not on file  Occupational History  . Not on file  Tobacco Use  . Smoking status: Never Smoker  . Smokeless tobacco: Never Used  Vaping Use  . Vaping Use: Never  used  Substance and Sexual Activity  . Alcohol use: No  . Drug use: No  . Sexual activity: Never  Other Topics Concern  . Not on file  Social History Narrative  . Not on file   Social Determinants of Health   Financial Resource Strain:   . Difficulty of Paying Living Expenses:   Food Insecurity:   . Worried About Charity fundraiser in the Last Year:   . Arboriculturist in the Last Year:   Transportation Needs:   . Film/video editor (Medical):   Marland Kitchen Lack of Transportation (Non-Medical):   Physical Activity:   . Days of Exercise per Week:   . Minutes of Exercise per Session:   Stress:   . Feeling of Stress :   Social Connections:   . Frequency of Communication with Friends and Family:   . Frequency of Social Gatherings with Friends and Family:   . Attends Religious Services:   . Active Member of Clubs or Organizations:   . Attends Archivist Meetings:   Marland Kitchen Marital Status:   Intimate Partner Violence:   . Fear of Current or Ex-Partner:   . Emotionally Abused:   Marland Kitchen Physically Abused:   . Sexually Abused:       Review of Systems  Constitutional: Positive for fatigue. Negative for chills and unexpected weight change.  HENT: Negative for congestion, rhinorrhea, sneezing and sore throat.   Respiratory: Negative for cough, chest tightness, shortness of breath and wheezing.   Cardiovascular: Negative for chest pain and palpitations.  Gastrointestinal: Negative for abdominal pain, constipation, diarrhea, nausea and vomiting.  Endocrine: Negative for cold intolerance, heat intolerance, polydipsia and polyuria.       Blood sugars doing well   Genitourinary: Positive for dysuria and frequency. Negative for urgency.  Musculoskeletal: Negative for arthralgias, back pain, joint swelling and neck pain.  Skin: Negative for rash.  Allergic/Immunologic: Negative for environmental allergies.  Neurological: Negative for dizziness, tremors, numbness and headaches.   Hematological: Negative for adenopathy. Does not bruise/bleed easily.  Psychiatric/Behavioral: Positive for sleep disturbance. Negative for behavioral problems. The patient is nervous/anxious.     Today's Vitals   11/10/19 0905  BP: (!) 175/58  Pulse: 60  Resp: 16  Temp: (!) 97.3 F (36.3 C)  SpO2: 98%  Weight: 145 lb 9.6 oz (66 kg)  Height: 5\' 2"  (1.575 m)   Body mass index is 26.63 kg/m.  Physical Exam Vitals and nursing note reviewed.  Constitutional:      General: She is not in acute distress.    Appearance: Normal appearance. She is well-developed. She is not diaphoretic.  HENT:     Head: Normocephalic and atraumatic.     Nose: Nose normal.     Mouth/Throat:  Pharynx: No oropharyngeal exudate.  Eyes:     Conjunctiva/sclera: Conjunctivae normal.     Pupils: Pupils are equal, round, and reactive to light.  Neck:     Thyroid: No thyromegaly.     Vascular: No carotid bruit or JVD.     Trachea: No tracheal deviation.  Cardiovascular:     Rate and Rhythm: Normal rate and regular rhythm.     Heart sounds: Normal heart sounds. No murmur heard.  No friction rub. No gallop.   Pulmonary:     Effort: Pulmonary effort is normal. No respiratory distress.     Breath sounds: Normal breath sounds. No wheezing or rales.  Chest:     Chest wall: No tenderness.  Abdominal:     Palpations: Abdomen is soft.  Genitourinary:    Comments: Urine sample positive for large WBC and trace blood.  Musculoskeletal:        General: Normal range of motion.     Cervical back: Normal range of motion and neck supple.  Lymphadenopathy:     Cervical: No cervical adenopathy.  Skin:    General: Skin is warm and dry.  Neurological:     Mental Status: She is alert and oriented to person, place, and time. Mental status is at baseline.     Cranial Nerves: No cranial nerve deficit.  Psychiatric:        Attention and Perception: Attention and perception normal.        Mood and Affect: Affect  normal. Mood is anxious.        Speech: Speech normal.        Behavior: Behavior normal.        Thought Content: Thought content normal.        Cognition and Memory: Cognition and memory normal.        Judgment: Judgment normal.   Assessment/Plan: 1. Urinary tract infection without hematuria, site unspecified Start cipro 500mg  twice daily for next seven days. Send urine for culture and sensitivity and adjust antibiotic treatment as indicated.  - ciprofloxacin (CIPRO) 500 MG tablet; Take 1 tablet (500 mg total) by mouth 2 (two) times daily. Take 1 tablet po BID for 7 days  Dispense: 14 tablet; Refill: 0  2. Recurrent UTI (urinary tract infection) After completion of cipro, start macrobid 100mg  daily for next few months.  - nitrofurantoin, macrocrystal-monohydrate, (MACROBID) 100 MG capsule; Take 1 capsule (100 mg total) by mouth daily.  Dispense: 30 capsule; Refill: 3  3. Type 2 diabetes mellitus with hyperglycemia, without long-term current use of insulin (HCC) - POCT HgB A1C 5.2 today. Continue to control with diet and activity. monitor closely.   4. Anxiety disorder, unspecified type May take 0.25mg  at bedtime as needed for acute anxiety. New prescription sent to her pharmacy today.  - ALPRAZolam (XANAX) 0.25 MG tablet; Take 1 tablet (0.25 mg total) by mouth at bedtime as needed for anxiety.  Dispense: 30 tablet; Refill: 2  5. Depression, major, single episode, in partial remission ([place]) Continue escitalopram as prescribed   6. Dysuria - POCT Urinalysis Dipstick - CULTURE, URINE COMPREHENSIVE  General Counseling: norman bier understanding of the findings of todays visit and agrees with plan of treatment. I have discussed any further diagnostic evaluation that may be needed or ordered today. We also reviewed her medications today. she has been encouraged to call the office with any questions or concerns that should arise related to todays visit.  This patient was seen by  Leretha Pol FNP Collaboration  with Dr Lavera Guise as a part of collaborative care agreement  Orders Placed This Encounter  Procedures  . CULTURE, URINE COMPREHENSIVE  . POCT Urinalysis Dipstick  . POCT HgB A1C    Meds ordered this encounter  Medications  . ciprofloxacin (CIPRO) 500 MG tablet    Sig: Take 1 tablet (500 mg total) by mouth 2 (two) times daily. Take 1 tablet po BID for 7 days    Dispense:  14 tablet    Refill:  0    Order Specific Question:   Supervising Provider    Answer:   Lavera Guise [[id number]]  . nitrofurantoin, macrocrystal-monohydrate, (MACROBID) 100 MG capsule    Sig: Take 1 capsule (100 mg total) by mouth daily.    Dispense:  30 capsule    Refill:  3    Patient to start daily dose macrobid after finishing BID course cipro due to recurrent urinary tract infections.    Order Specific Question:   Supervising Provider    Answer:   Lavera Guise [[id number]]  . ALPRAZolam (XANAX) 0.25 MG tablet    Sig: Take 1 tablet (0.25 mg total) by mouth at bedtime as needed for anxiety.    Dispense:  30 tablet    Refill:  2    Order Specific Question:   Supervising Provider    Answer:   Lavera Guise [[id number]]    Total time spent: 30 Minutes   Time spent includes review of chart, medications, test results, and follow up plan with the patient.      Dr Lavera Guise Internal medicine

## 2019-11-13 NOTE — Progress Notes (Signed)
Started cipro BID for 7 days. Will then take macrobid 100mg  daily to prevent further infection.

## 2019-11-14 LAB — CULTURE, URINE COMPREHENSIVE

## 2019-11-14 NOTE — Progress Notes (Signed)
Please let the patient know that cipro will be ineffective for current uti. She should go ahead and start macrobid. Start with twice daily for 7 days, then go to the daily dose we talked about to prevent further infection. Thanks.

## 2019-11-20 DIAGNOSIS — F324 Major depressive disorder, single episode, in partial remission: Secondary | ICD-10-CM | POA: Insufficient documentation

## 2020-01-03 ENCOUNTER — Telehealth: Payer: Self-pay

## 2020-01-03 NOTE — Telephone Encounter (Signed)
Confirmed and screened for 01-05-20 ov.

## 2020-01-04 ENCOUNTER — Other Ambulatory Visit: Payer: Self-pay

## 2020-01-04 DIAGNOSIS — E039 Hypothyroidism, unspecified: Secondary | ICD-10-CM

## 2020-01-04 DIAGNOSIS — I10 Essential (primary) hypertension: Secondary | ICD-10-CM

## 2020-01-04 MED ORDER — AMLODIPINE BESYLATE 5 MG PO TABS: 5.0000 mg | ORAL_TABLET | Freq: Every day | ORAL | 1 refills | Status: DC

## 2020-01-04 MED ORDER — LEVOTHYROXINE SODIUM 112 MCG PO TABS: 112.0000 ug | ORAL_TABLET | Freq: Every day | ORAL | 3 refills | Status: DC

## 2020-01-05 ENCOUNTER — Ambulatory Visit (INDEPENDENT_AMBULATORY_CARE_PROVIDER_SITE_OTHER): Payer: Medicare Other | Admitting: Nurse Practitioner

## 2020-01-05 ENCOUNTER — Encounter: Payer: Self-pay | Admitting: Nurse Practitioner

## 2020-01-05 ENCOUNTER — Other Ambulatory Visit: Payer: Self-pay

## 2020-01-05 VITALS — BP 150/79 | HR 83 | Temp 97.6°F | Resp 16 | Ht 62.0 in | Wt 140.6 lb

## 2020-01-05 DIAGNOSIS — R42 Dizziness and giddiness: Secondary | ICD-10-CM

## 2020-01-05 DIAGNOSIS — R300 Dysuria: Secondary | ICD-10-CM | POA: Diagnosis not present

## 2020-01-05 DIAGNOSIS — R410 Disorientation, unspecified: Secondary | ICD-10-CM | POA: Diagnosis not present

## 2020-01-05 DIAGNOSIS — I10 Essential (primary) hypertension: Secondary | ICD-10-CM

## 2020-01-05 DIAGNOSIS — I6523 Occlusion and stenosis of bilateral carotid arteries: Secondary | ICD-10-CM

## 2020-01-05 DIAGNOSIS — G4701 Insomnia due to medical condition: Secondary | ICD-10-CM

## 2020-01-05 DIAGNOSIS — N390 Urinary tract infection, site not specified: Secondary | ICD-10-CM

## 2020-01-05 DIAGNOSIS — E039 Hypothyroidism, unspecified: Secondary | ICD-10-CM

## 2020-01-05 LAB — POCT URINALYSIS DIPSTICK
Bilirubin, UA: NEGATIVE
Blood, UA: NEGATIVE
Glucose, UA: NEGATIVE
Ketones, UA: NEGATIVE
Nitrite, UA: POSITIVE
Protein, UA: POSITIVE — AB
Spec Grav, UA: 1.015 (ref 1.010–1.025)
Urobilinogen, UA: 0.2 E.U./dL
pH, UA: 5 (ref 5.0–8.0)

## 2020-01-05 MED ORDER — SULFAMETHOXAZOLE-TRIMETHOPRIM 400-80 MG PO TABS: ORAL_TABLET | ORAL | 0 refills | Status: DC

## 2020-01-05 MED ORDER — SULFAMETHOXAZOLE-TRIMETHOPRIM 400-80 MG PO TABS: ORAL_TABLET | ORAL | 2 refills | Status: DC

## 2020-01-05 NOTE — Progress Notes (Signed)
[facility] [place],  [id number]  Internal MEDICINE  Office Visit Note  Patient Name: Maria Sanchez  [id number]  [id number]  Date of Service: 01/05/2020  Chief Complaint  Patient presents with  . Follow-up    questions about medication  . Hypertension  . Hyperlipidemia  . Hearing Loss    trouble hearing    The patient presents to the office for follow up visit. She presents with her daughter.who is primary historian during the visit. She states that the patient continues to see or hear two people who are not present. She states the people the patient sees are neighbors from a long time ago and no longer live near the patient. The patient's symptoms are generally worse when she has a urinary tract infection. She was recently treated for UTI with Macrobid. Took this twice daily for 7 days and has been taking it daily ever since. The urinary tract infection has not improved, in fact, the infection looks to be more complex, with nitrites and large WBC in the urine. She did have a renal ultrasound which showed a smaller right kidney than left. There were no structural abnormalities and the bladder was unremarkable.  The patient also had a fall in her home on Saturday. She states she fell coming into the home after walking up a few stairs. She states that she got a bit dizzy before the fall, and has had a few episodes of dizziness here and there. She did injure her left index finger. She has a nicely healing cut on the inside aspect of the finger which is slightly tender.       Current Medication: Outpatient Encounter Medications as of 01/05/2020  Medication Sig  . allopurinol (ZYLOPRIM) 100 MG tablet TAKE 2 TABLETS BY MOUTH AT BEDTIME AS NEEDED FOR GOUT  . amLODipine (NORVASC) 5 MG tablet Take 1 tablet (5 mg total) by mouth daily.  Marland Kitchen aspirin EC 81 MG tablet Take 81 mg daily by mouth.  . escitalopram (LEXAPRO) 10 MG tablet TAKE 1 TABLET BY MOUTH EVERY DAY WITH  SUPPER  . hydrALAZINE (APRESOLINE) 25 MG tablet TAKE 1 TABLET BY MOUTH 3 TIMES A DAY FOR BLOOD PRESSURE  . levothyroxine (SYNTHROID) 112 MCG tablet Take 1 tablet (112 mcg total) by mouth daily before breakfast.  . metoprolol succinate (TOPROL-XL) 25 MG 24 hr tablet Take 1 tablet (25 mg total) by mouth at bedtime.  . Multiple Vitamins-Minerals (PRESERVISION AREDS 2) CAPS Take by mouth 2 (two) times daily.  . pravastatin (PRAVACHOL) 10 MG tablet Take 1 tablet (10 mg total) by mouth at bedtime.  Marland Kitchen QUEtiapine (SEROQUEL) 25 MG tablet Take 1 tablet (25 mg total) by mouth at bedtime.  . [DISCONTINUED] ALPRAZolam (XANAX) 0.25 MG tablet Take 1 tablet (0.25 mg total) by mouth at bedtime as needed for anxiety.  . [DISCONTINUED] ciprofloxacin (CIPRO) 500 MG tablet Take 1 tablet (500 mg total) by mouth 2 (two) times daily. Take 1 tablet po BID for 7 days  . [DISCONTINUED] nitrofurantoin, macrocrystal-monohydrate, (MACROBID) 100 MG capsule Take 1 capsule (100 mg total) by mouth daily.  Marland Kitchen sulfamethoxazole-trimethoprim (BACTRIM) 400-80 MG tablet Take 1 tablet po BID for 10 days, then take 1 tablet po QD to prevent further infection  . [DISCONTINUED] sulfamethoxazole-trimethoprim (BACTRIM) 400-80 MG tablet Take 1 tablet po BID for 10 days, then take 1 tablet po QD to prevent further infection   No facility-administered encounter medications on file as of 01/05/2020.  Surgical History: Past Surgical History:  Procedure Laterality Date  . ABDOMINAL HYSTERECTOMY    . BREAST SURGERY     benign  . CHOLECYSTECTOMY    . CORONARY ARTERY BYPASS GRAFT    . exicision salivary gland    . MITRAL VALVE REPLACEMENT (MVR)/CORONARY ARTERY BYPASS GRAFTING (CABG)    . posterolateral fusion    . stent placed in kidney Right   . thumb arthroplasty  06/20/2005    Medical History: Past Medical History:  Diagnosis Date  . Arthritis   . CAD (coronary artery disease)   . Hyperlipidemia   . Hypertension   . Renal artery  stenosis ([place])   . Thyroid disease     Family History: Family History  Problem Relation Age of Onset  . Colon cancer Mother   . Breast cancer Sister   . Colon cancer Brother     Social History   Socioeconomic History  . Marital status: Widowed    Spouse name: Not on file  . Number of children: Not on file  . Years of education: Not on file  . Highest education level: Not on file  Occupational History  . Not on file  Tobacco Use  . Smoking status: Never Smoker  . Smokeless tobacco: Never Used  Vaping Use  . Vaping Use: Never used  Substance and Sexual Activity  . Alcohol use: No  . Drug use: No  . Sexual activity: Never  Other Topics Concern  . Not on file  Social History Narrative  . Not on file   Social Determinants of Health   Financial Resource Strain:   . Difficulty of Paying Living Expenses: Not on file  Food Insecurity:   . Worried About Charity fundraiser in the Last Year: Not on file  . Ran Out of Food in the Last Year: Not on file  Transportation Needs:   . Lack of Transportation (Medical): Not on file  . Lack of Transportation (Non-Medical): Not on file  Physical Activity:   . Days of Exercise per Week: Not on file  . Minutes of Exercise per Session: Not on file  Stress:   . Feeling of Stress : Not on file  Social Connections:   . Frequency of Communication with Friends and Family: Not on file  . Frequency of Social Gatherings with Friends and Family: Not on file  . Attends Religious Services: Not on file  . Active Member of Clubs or Organizations: Not on file  . Attends Archivist Meetings: Not on file  . Marital Status: Not on file  Intimate Partner Violence:   . Fear of Current or Ex-Partner: Not on file  . Emotionally Abused: Not on file  . Physically Abused: Not on file  . Sexually Abused: Not on file      Review of Systems  Constitutional: Positive for appetite change and unexpected weight change. Negative for activity  change, chills and fatigue.       She has had a five pound weight loss since her last visit.   HENT: Positive for postnasal drip. Negative for congestion, rhinorrhea, sneezing and sore throat.   Respiratory: Negative for cough, chest tightness, shortness of breath and wheezing.   Cardiovascular: Negative for chest pain and palpitations.  Gastrointestinal: Negative for abdominal pain, constipation, diarrhea, nausea and vomiting.  Endocrine: Negative for cold intolerance, heat intolerance, polydipsia and polyuria.  Genitourinary: Negative for dysuria, frequency and urgency.  Musculoskeletal: Negative for arthralgias, back pain, joint swelling and  neck pain.  Skin: Positive for wound. Negative for rash.       Healing cut to the inside aspect of her right index finger. This is slightly tender.  Allergic/Immunologic: Negative for environmental allergies.  Neurological: Positive for dizziness, syncope, speech difficulty and light-headedness. Negative for tremors and numbness.  Hematological: Negative for adenopathy. Does not bruise/bleed easily.  Psychiatric/Behavioral: Positive for confusion. Negative for behavioral problems (Depression), sleep disturbance and suicidal ideas. The patient is nervous/anxious.    Today's Vitals   01/05/20 0831  BP: (!) 150/79  Pulse: 83  Resp: 16  Temp: 97.6 F (36.4 C)  SpO2: 97%  Weight: 140 lb 9.6 oz (63.8 kg)  Height: 5\' 2"  (1.575 m)   Body mass index is 25.72 kg/m.  Physical Exam Vitals and nursing note reviewed.  Constitutional:      General: She is not in acute distress.    Appearance: Normal appearance. She is well-developed. She is not diaphoretic.  HENT:     Head: Normocephalic and atraumatic.     Mouth/Throat:     Pharynx: No oropharyngeal exudate.  Eyes:     Pupils: Pupils are equal, round, and reactive to light.  Neck:     Thyroid: No thyromegaly.     Vascular: Carotid bruit present. No JVD.     Trachea: No tracheal deviation.      Comments: Very soft, blowing carotid bruit on left side of neck.  Cardiovascular:     Rate and Rhythm: Normal rate and regular rhythm.     Heart sounds: Murmur heard.  No friction rub. No gallop.   Pulmonary:     Effort: Pulmonary effort is normal. No respiratory distress.     Breath sounds: Normal breath sounds. No wheezing or rales.  Chest:     Chest wall: No tenderness.  Abdominal:     General: Bowel sounds are normal.     Palpations: Abdomen is soft.     Tenderness: There is no abdominal tenderness.  Genitourinary:    Comments: Urine sample positive for nitrites, protein, and large WBC.  Musculoskeletal:        General: Normal range of motion.     Cervical back: Normal range of motion and neck supple.  Lymphadenopathy:     Cervical: No cervical adenopathy.  Skin:    General: Skin is warm and dry.     Comments: There is healing laceration on the palmar surface of proximal right index finger. The edges are well approximated. There is some localized inflammation and mild tenderness noted with palpation. There is no evidence of infection or cellulitis.   Neurological:     Mental Status: She is alert and oriented to person, place, and time. Mental status is at baseline.     Cranial Nerves: No cranial nerve deficit.  Psychiatric:        Mood and Affect: Mood normal.        Behavior: Behavior normal.        Thought Content: Thought content normal.        Judgment: Judgment normal.    Assessment/Plan: 1. Recurrent UTI (urinary tract infection) Start bactrim twice daily for 10 days. Continue daily dosing after that to reduce recurrent UTI. Reviewed renal ultrasound which was essentially negative. Will refer to urology for further evaluation.  - Ambulatory referral to Urology - sulfamethoxazole-trimethoprim (BACTRIM) 400-80 MG tablet; Take 1 tablet po BID for 10 days, then take 1 tablet po QD to prevent further infection  Dispense: 40 tablet;  Refill: 2  2. Dizziness of unknown  cause Will get MRI of the brain and carotid doppler for further devaluation.  - MR Brain Wo Contrast; Future - US Carotid Bilateral; Future  3. Disorientation Will get MRI of the brain and carotid doppler for further devaluation.  - MR Brain Wo Contrast; Future - US Carotid Bilateral; Future  4. Bilateral carotid artery stenosis Carotid doppler ordered for further evaluation.   5. Dysuria Send urine for culture and sensitivity and adjust antibiotic therapy as indicated.  - POCT Urinalysis Dipstick - CULTURE, URINE COMPREHENSIVE  6. Essential hypertension Stable. Continue bp medication as prescribed   7. Acquired hypothyroidism Check thyroid panel and adjust levothyroxine as indicated.   8. Insomnia due to medical condition May continue seroquel at 25mg  at bedtime if needed for insomnia.   General Counseling: cherrise occhipinti understanding of the findings of todays visit and agrees with plan of treatment. I have discussed any further diagnostic evaluation that may be needed or ordered today. We also reviewed her medications today. she has been encouraged to call the office with any questions or concerns that should arise related to todays visit.  This patient was seen by Leretha Pol FNP Collaboration with Dr Lavera Guise as a part of collaborative care agreement  Orders Placed This Encounter  Procedures  . CULTURE, URINE COMPREHENSIVE  . MR Brain Wo Contrast  . US Carotid Bilateral  . Ambulatory referral to Urology  . POCT Urinalysis Dipstick    Meds ordered this encounter  Medications  . DISCONTD: sulfamethoxazole-trimethoprim (BACTRIM) 400-80 MG tablet    Sig: Take 1 tablet po BID for 10 days, then take 1 tablet po QD to prevent further infection    Dispense:  20 tablet    Refill:  0    Please d/c macrobid. This is ineffective    Order Specific Question:   Supervising Provider    Answer:   Lavera Guise [[id number]]  . sulfamethoxazole-trimethoprim (BACTRIM) 400-80 MG  tablet    Sig: Take 1 tablet po BID for 10 days, then take 1 tablet po QD to prevent further infection    Dispense:  40 tablet    Refill:  2    Please d/c macrobid. This is ineffective. Please disregard first prescription. This is accurate number with refills. Thanks.    Order Specific Question:   Supervising Provider    Answer:   Lavera Guise [[id number]]    Total time spent: 76 Minutes   Time spent includes review of chart, medications, test results, and follow up plan with the patient.      Dr Lavera Guise Internal medicine

## 2020-01-09 LAB — CULTURE, URINE COMPREHENSIVE

## 2020-01-09 NOTE — Progress Notes (Signed)
Started her on bactrim at the time of her visit .

## 2020-01-10 ENCOUNTER — Ambulatory Visit: Payer: Self-pay | Admitting: Urology

## 2020-01-16 ENCOUNTER — Telehealth: Payer: Self-pay

## 2020-01-16 NOTE — Telephone Encounter (Signed)
CONFIRMED PATIENT APPOINTMENT 01/18/20

## 2020-01-18 ENCOUNTER — Other Ambulatory Visit: Payer: Self-pay

## 2020-01-18 ENCOUNTER — Ambulatory Visit (INDEPENDENT_AMBULATORY_CARE_PROVIDER_SITE_OTHER): Payer: Medicare Other | Admitting: Urology

## 2020-01-18 ENCOUNTER — Encounter: Payer: Self-pay | Admitting: Urology

## 2020-01-18 ENCOUNTER — Ambulatory Visit (INDEPENDENT_AMBULATORY_CARE_PROVIDER_SITE_OTHER): Payer: Medicare Other

## 2020-01-18 VITALS — BP 144/77 | HR 70 | Ht 61.0 in | Wt 140.0 lb

## 2020-01-18 DIAGNOSIS — R410 Disorientation, unspecified: Secondary | ICD-10-CM | POA: Diagnosis not present

## 2020-01-18 DIAGNOSIS — N390 Urinary tract infection, site not specified: Secondary | ICD-10-CM

## 2020-01-18 DIAGNOSIS — R55 Syncope and collapse: Secondary | ICD-10-CM

## 2020-01-18 DIAGNOSIS — R42 Dizziness and giddiness: Secondary | ICD-10-CM

## 2020-01-18 LAB — BLADDER SCAN AMB NON-IMAGING: Scan Result: 60

## 2020-01-18 NOTE — Patient Instructions (Addendum)
1. Take cranberry tablets daily to prevent UTIs 2. Apply pea sized amount of estrogen cream to urethra 3x weekly to prevent UTI  Urinary Tract Infection, Adult  A urinary tract infection (UTI) is an infection of any part of the urinary tract. The urinary tract includes the kidneys, ureters, bladder, and urethra. These organs make, store, and get rid of urine in the body. Your health care provider may use other names to describe the infection. An upper UTI affects the ureters and kidneys (pyelonephritis). A lower UTI affects the bladder (cystitis) and urethra (urethritis). What are the causes? Most urinary tract infections are caused by bacteria in your genital area, around the entrance to your urinary tract (urethra). These bacteria grow and cause inflammation of your urinary tract. What increases the risk? You are more likely to develop this condition if:  You have a urinary catheter that stays in place (indwelling).  You are not able to control when you urinate or have a bowel movement (you have incontinence).  You are female and you: ? Use a spermicide or diaphragm for birth control. ? Have low estrogen levels. ? Are pregnant.  You have certain genes that increase your risk (genetics).  You are sexually active.  You take antibiotic medicines.  You have a condition that causes your flow of urine to slow down, such as: ? An enlarged prostate, if you are female. ? Blockage in your urethra (stricture). ? A kidney stone. ? A nerve condition that affects your bladder control (neurogenic bladder). ? Not getting enough to drink, or not urinating often.  You have certain medical conditions, such as: ? Diabetes. ? A weak disease-fighting system (immunesystem). ? Sickle cell disease. ? Gout. ? Spinal cord injury. What are the signs or symptoms? Symptoms of this condition include:  Needing to urinate right away (urgently).  Frequent urination or passing small amounts of urine  frequently.  Pain or burning with urination.  Blood in the urine.  Urine that smells bad or unusual.  Trouble urinating.  Cloudy urine.  Vaginal discharge, if you are female.  Pain in the abdomen or the lower back. You may also have:  Vomiting or a decreased appetite.  Confusion.  Irritability or tiredness.  A fever.  Diarrhea. The first symptom in older adults may be confusion. In some cases, they may not have any symptoms until the infection has worsened. How is this diagnosed? This condition is diagnosed based on your medical history and a physical exam. You may also have other tests, including:  Urine tests.  Blood tests.  Tests for sexually transmitted infections (STIs). If you have had more than one UTI, a cystoscopy or imaging studies may be done to determine the cause of the infections. How is this treated? Treatment for this condition includes:  Antibiotic medicine.  Over-the-counter medicines to treat discomfort.  Drinking enough water to stay hydrated. If you have frequent infections or have other conditions such as a kidney stone, you may need to see a health care provider who specializes in the urinary tract (urologist). In rare cases, urinary tract infections can cause sepsis. Sepsis is a life-threatening condition that occurs when the body responds to an infection. Sepsis is treated in the hospital with IV antibiotics, fluids, and other medicines. Follow these instructions at home:  Medicines  Take over-the-counter and prescription medicines only as told by your health care provider.  If you were prescribed an antibiotic medicine, take it as told by your health care provider. Do  not stop using the antibiotic even if you start to feel better. General instructions  Make sure you: ? Empty your bladder often and completely. Do not hold urine for long periods of time. ? Empty your bladder after sex. ? Wipe from front to back after a bowel movement  if you are female. Use each tissue one time when you wipe.  Drink enough fluid to keep your urine pale yellow.  Keep all follow-up visits as told by your health care provider. This is important. Contact a health care provider if:  Your symptoms do not get better after 1-2 days.  Your symptoms go away and then return. Get help right away if you have:  Severe pain in your back or your lower abdomen.  A fever.  Nausea or vomiting. Summary  A urinary tract infection (UTI) is an infection of any part of the urinary tract, which includes the kidneys, ureters, bladder, and urethra.  Most urinary tract infections are caused by bacteria in your genital area, around the entrance to your urinary tract (urethra).  Treatment for this condition often includes antibiotic medicines.  If you were prescribed an antibiotic medicine, take it as told by your health care provider. Do not stop using the antibiotic even if you start to feel better.  Keep all follow-up visits as told by your health care provider. This is important. This information is not intended to replace advice given to you by your health care provider. Make sure you discuss any questions you have with your health care provider. Document Revised: 04/01/2018 Document Reviewed: 10/22/2017 Elsevier Patient Education  2020 [employer].   Asymptomatic Bacteriuria  Asymptomatic bacteriuria is the presence of a large number of bacteria in the urine without the usual symptoms of burning or frequent urination. What are the causes? This condition is caused by an increase in bacteria in the urine. This increase can be caused by:  Bacteria entering the urinary tract, such as during sex.  A blockage in the urinary tract, such as from kidney stones or a tumor.  Bladder problems that prevent the bladder from emptying. What increases the risk? You are more likely to develop this condition if:  You have diabetes mellitus.  You are an  elderly adult, especially if you are also in a long-term care facility.  You are pregnant and in the first trimester.  You have kidney stones.  You are female.  You have had a kidney transplant.  You have a leaky kidney tube valve (reflux).  You had a urinary catheter for a long period of time. What are the signs or symptoms? There are no symptoms of this condition. How is this diagnosed? This condition is diagnosed with a urine test. Because this condition does not cause symptoms, it is usually diagnosed when a urine sample is taken to treat or diagnose another condition, such as pregnancy or kidney problems. Most women who are in their first trimester of pregnancy are screened for asymptomatic bacteriuria. How is this treated? Usually, treatment is not needed for this condition. Treating the condition can lead to other problems, such as a yeast infection or the growth of bacteria that do not respond to treatment (antibiotic-resistant bacteria). Some people, such as pregnant women and people with kidney transplants, do need treatment with antibiotic medicines to prevent kidney infection (pyelonephritis). In pregnant women, kidney infection can lead to premature labor, fetal growth restriction, or newborn death. Follow these instructions at home: Medicines  Take over-the-counter and prescription medicines  only as told by your health care provider.  If you were prescribed an antibiotic medicine, take it as told by your health care provider. Do not stop taking the antibiotic even if you start to feel better. General instructions  Monitor your condition for any changes.  Drink enough fluid to keep your urine clear or pale yellow.  Go to the bathroom more often to keep your bladder empty.  If you are female, keep the area around your vagina and rectum clean. Wipe yourself from front to back after urinating.  Keep all follow-up visits as told by your health care provider. This is  important. Contact a health care provider if:  You notice any new symptoms, such as back pain or burning while urinating. Get help right away if:  You develop signs of an infection such as: ? A burning sensation when you urinate. ? Have pain when you urinate. ? Develop an intense need to urinate. ? Urinating more frequently. ? Back pain or pelvic pain. ? Fever or chills.  You have blood in your urine.  Your urine becomes discolored or cloudy.  Your urine smells bad.  You have severe pain that cannot be controlled with medicine. Summary  Asymptomatic bacteriuria is the presence of a large number of bacteria in the urine without the usual symptoms of burning or frequent urination.  Usually, treatment is not needed for this condition. Treating the condition can lead to other problems, such as too much yeast and the growth of antibiotic-resistant bacteria.  Some people, such as pregnant women and people with kidney transplants, do need treatment with antibiotic medicines to prevent kidney infection (pyelonephritis).  If you were prescribed an antibiotic medicine, take it as told by your health care provider. Do not stop taking the antibiotic even if you start to feel better. This information is not intended to replace advice given to you by your health care provider. Make sure you discuss any questions you have with your health care provider. Document Revised: 08/03/2018 Document Reviewed: 04/08/2016 Elsevier Patient Education  [employer].

## 2020-01-18 NOTE — Progress Notes (Signed)
01/18/20 2:05 PM   Maria Sanchez 1930/10/03 [id number]  CC: Recurrent UTI  HPI: I saw Maria Sanchez and her daughter in urology clinic today for evaluation of recurrent UTI.  She is a comorbid 86 year old female with CAD, history of renal artery stenosis and atrophic right kidney, who has had 4 positive urine cultures over the last 5 months including Klebsiella and E. coli with multiple resistances.  She denies any symptoms of dysuria, urgency, frequency, or pelvic pain, however her daughter reports worsening confusion as her symptomatic features of a UTI.  She does think her confusion improves after taking antibiotics.  She recently finished a course of Bactrim DS twice daily x2 weeks, and transition to daily Bactrim at their PCPs recommendation.  An ultrasound was performed in July 2021 that showed no hydronephrosis or evidence of stone disease.  She denies any gross hematuria, flank pain, or incontinence.  She denies any history of UTIs prior to the last 6 months.  She denies any constipation.  She also has a CT head next week for further evaluation of her worsening confusion.  Urinalysis today is benign with 0-5 WBCs, 0-2 RBCs, few bacteria, nitrite negative.  PVR is normal at 60 mL.   PMH: Past Medical History:  Diagnosis Date  . Arthritis   . CAD (coronary artery disease)   . Hyperlipidemia   . Hypertension   . Renal artery stenosis ([place])   . Thyroid disease     Surgical History: Past Surgical History:  Procedure Laterality Date  . ABDOMINAL HYSTERECTOMY    . BREAST SURGERY     benign  . CHOLECYSTECTOMY    . CORONARY ARTERY BYPASS GRAFT    . exicision salivary gland    . MITRAL VALVE REPLACEMENT (MVR)/CORONARY ARTERY BYPASS GRAFTING (CABG)    . posterolateral fusion    . stent placed in kidney Right   . thumb arthroplasty  06/20/2005    Family History: Family History  Problem Relation Age of Onset  . Colon cancer Mother   . Breast cancer Sister   . Colon cancer  Brother     Social History:  reports that she has never smoked. She has never used smokeless tobacco. She reports that she does not drink alcohol and does not use drugs.  Physical Exam: BP (!) 144/77   Pulse 70   Ht 5\' 1"  (1.549 m)   Wt 140 lb (63.5 kg)   BMI 26.45 kg/m    Constitutional: Frail-appearing, hard of hearing Cardiovascular: No clubbing, cyanosis, or edema. Respiratory: Normal respiratory effort, no increased work of breathing. GI: Abdomen is soft, nontender, nondistended, no abdominal masses  Laboratory Data: Culture data reviewed in epic  Pertinent Imaging: Renal ultrasound with no hydronephrosis or stone disease  Assessment & Plan:   She is a comorbid 86 year old female with 4 culture documented UTIs over the last 6 months, with primary symptom of confusion.  We discussed at length the difference between asymptomatic bacteriuria and true urinary tract infection, but it does sound like these are true UTIs, as her confusion improves with antibiotics.  The CT head that is upcoming will be more revealing in terms of her increased confusion and dementia.  We discussed the evaluation and treatment of patients with recurrent UTIs at length.  We specifically discussed the differences between asymptomatic bacteriuria and true urinary tract infection.  We discussed the AUA definition of recurrent UTI of at least 2 culture proven symptomatic acute cystitis episodes in a 41-month period, or  3 within a 1 year period.  We discussed the importance of culture directed antibiotic treatment, and antibiotic stewardship.  First-line therapy includes nitrofurantoin(5 days), Bactrim(3 days), or fosfomycin(3 g single dose).  Possible etiologies of recurrent infection include periurethral tissue atrophy in postmenopausal woman, constipation, sexual activity, incomplete emptying, anatomic abnormalities, and even genetic predisposition.  Finally, we discussed the role of perineal hygiene, timed  voiding, adequate hydration, topical vaginal estrogen, cranberry prophylaxis, and low-dose antibiotic prophylaxis.  Trial of estrogen cream 3 times weekly, samples provided Cranberry tablets daily for UTI prevention Continue Bactrim daily prophylaxis for 3 months RTC 3 months symptom check-discontinue Bactrim at that visit if doing well  Nickolas Madrid, MD 01/18/2020  [facility] [address] [place], [place] [address] [phone]

## 2020-01-19 ENCOUNTER — Ambulatory Visit (INDEPENDENT_AMBULATORY_CARE_PROVIDER_SITE_OTHER): Payer: Medicare Other | Admitting: Nurse Practitioner

## 2020-01-19 ENCOUNTER — Ambulatory Visit: Payer: Medicare Other | Admitting: Nurse Practitioner

## 2020-01-19 VITALS — BP 148/80 | HR 66 | Temp 97.1°F | Resp 16 | Ht 61.0 in | Wt 138.0 lb

## 2020-01-19 DIAGNOSIS — I6523 Occlusion and stenosis of bilateral carotid arteries: Secondary | ICD-10-CM | POA: Diagnosis not present

## 2020-01-19 DIAGNOSIS — I10 Essential (primary) hypertension: Secondary | ICD-10-CM | POA: Diagnosis not present

## 2020-01-19 DIAGNOSIS — Z23 Encounter for immunization: Secondary | ICD-10-CM | POA: Diagnosis not present

## 2020-01-19 DIAGNOSIS — Z0001 Encounter for general adult medical examination with abnormal findings: Secondary | ICD-10-CM

## 2020-01-19 DIAGNOSIS — R410 Disorientation, unspecified: Secondary | ICD-10-CM

## 2020-01-19 DIAGNOSIS — R634 Abnormal weight loss: Secondary | ICD-10-CM | POA: Diagnosis not present

## 2020-01-19 NOTE — Progress Notes (Signed)
[facility] [place], [place] [address]  Internal MEDICINE  Office Visit Note  Patient Name: Maria Sanchez  [id number]  [id number]  Date of Service: 02/04/2020   Pt is here for routine health maintenance examination   Chief Complaint  Patient presents with  . Medicare Wellness  . Hypertension  . Hyperlipidemia     The patient is here for health maintenance exam.  The patient is currently on long term antibiotics to help prevent further urinary tract infection.  Her carotid doppler shows mild plaque with <50% stenosis, bilaterally. She would like to have a flu shot at her visit today. Her blood pressure is generally well managed. She does see a cardiologist routinely.  She is due to have routine, fasting labs. She does continue to have decreased appetite and weight loss even though she is not trying to lose weight.    Current Medication: Outpatient Encounter Medications as of 01/19/2020  Medication Sig  . allopurinol (ZYLOPRIM) 100 MG tablet TAKE 2 TABLETS BY MOUTH AT BEDTIME AS NEEDED FOR GOUT  . amLODipine (NORVASC) 5 MG tablet Take 1 tablet (5 mg total) by mouth daily.  Marland Kitchen aspirin EC 81 MG tablet Take 81 mg daily by mouth.  . escitalopram (LEXAPRO) 10 MG tablet TAKE 1 TABLET BY MOUTH EVERY DAY WITH SUPPER  . hydrALAZINE (APRESOLINE) 25 MG tablet TAKE 1 TABLET BY MOUTH 3 TIMES A DAY FOR BLOOD PRESSURE  . levothyroxine (SYNTHROID) 112 MCG tablet Take 1 tablet (112 mcg total) by mouth daily before breakfast.  . metoprolol succinate (TOPROL-XL) 25 MG 24 hr tablet Take 1 tablet (25 mg total) by mouth at bedtime.  . Multiple Vitamins-Minerals (PRESERVISION AREDS 2) CAPS Take by mouth 2 (two) times daily.  . pravastatin (PRAVACHOL) 10 MG tablet Take 1 tablet (10 mg total) by mouth at bedtime.  . sulfamethoxazole-trimethoprim (BACTRIM) 400-80 MG tablet Take 1 tablet po BID for 10 days, then take 1 tablet po QD to prevent further infection  . [DISCONTINUED]  QUEtiapine (SEROQUEL) 25 MG tablet Take 1 tablet (25 mg total) by mouth at bedtime.   No facility-administered encounter medications on file as of 01/19/2020.    Surgical History: Past Surgical History:  Procedure Laterality Date  . ABDOMINAL HYSTERECTOMY    . BREAST SURGERY     benign  . CHOLECYSTECTOMY    . CORONARY ARTERY BYPASS GRAFT    . exicision salivary gland    . MITRAL VALVE REPLACEMENT (MVR)/CORONARY ARTERY BYPASS GRAFTING (CABG)    . posterolateral fusion    . stent placed in kidney Right   . thumb arthroplasty  06/20/2005    Medical History: Past Medical History:  Diagnosis Date  . Arthritis   . CAD (coronary artery disease)   . Hyperlipidemia   . Hypertension   . Renal artery stenosis ([place])   . Thyroid disease     Family History: Family History  Problem Relation Age of Onset  . Colon cancer Mother   . Breast cancer Sister   . Colon cancer Brother       Review of Systems  Constitutional: Positive for appetite change and unexpected weight change. Negative for activity change, chills and fatigue.       She has had a five pound weight loss since her last visit.   HENT: Negative for congestion, postnasal drip, rhinorrhea, sneezing and sore throat.   Respiratory: Negative for cough, chest tightness, shortness of breath and wheezing.   Cardiovascular: Negative for chest  pain and palpitations.  Gastrointestinal: Negative for abdominal pain, constipation, diarrhea, nausea and vomiting.  Endocrine: Negative for cold intolerance, heat intolerance, polydipsia and polyuria.  Genitourinary: Negative for dysuria, frequency and urgency.       Recurrent UTI. Patient now seeing urology  Musculoskeletal: Negative for arthralgias, back pain, joint swelling and neck pain.  Skin: Positive for wound. Negative for rash.  Allergic/Immunologic: Negative for environmental allergies.  Neurological: Positive for dizziness and speech difficulty. Negative for tremors, syncope,  light-headedness and numbness.  Hematological: Negative for adenopathy. Does not bruise/bleed easily.  Psychiatric/Behavioral: Positive for confusion. Negative for behavioral problems (Depression), sleep disturbance and suicidal ideas. The patient is nervous/anxious.      Today's Vitals   01/19/20 1127  BP: (!) 148/80  Pulse: 66  Resp: 16  Temp: (!) 97.1 F (36.2 C)  SpO2: 98%  Weight: 138 lb (62.6 kg)  Height: _0  (1.549 m)   Body mass index is 26.07 kg/m.  Physical Exam Vitals and nursing note reviewed.  Constitutional:      General: She is not in acute distress.    Appearance: Normal appearance. She is well-developed. She is not diaphoretic.  HENT:     Head: Normocephalic and atraumatic.     Nose: Nose normal.     Mouth/Throat:     Pharynx: No oropharyngeal exudate.  Eyes:     Pupils: Pupils are equal, round, and reactive to light.  Neck:     Thyroid: No thyromegaly.     Vascular: Carotid bruit present. No JVD.     Trachea: No tracheal deviation.     Comments: Very soft, blowing carotid bruit on left side of neck.  Cardiovascular:     Rate and Rhythm: Normal rate and regular rhythm.     Heart sounds: Murmur heard.  No friction rub. No gallop.   Pulmonary:     Effort: Pulmonary effort is normal. No respiratory distress.     Breath sounds: Normal breath sounds. No wheezing or rales.  Chest:     Chest wall: No tenderness.  Abdominal:     General: Bowel sounds are normal.     Palpations: Abdomen is soft.     Tenderness: There is no abdominal tenderness.  Musculoskeletal:        General: Normal range of motion.     Cervical back: Normal range of motion and neck supple.  Lymphadenopathy:     Cervical: No cervical adenopathy.  Skin:    General: Skin is warm and dry.  Neurological:     Mental Status: She is alert and oriented to person, place, and time. Mental status is at baseline.     Cranial Nerves: No cranial nerve deficit.  Psychiatric:        Mood and  Affect: Mood normal.        Behavior: Behavior normal.        Thought Content: Thought content normal.        Judgment: Judgment normal.    Depression screen [facility] 2/9 01/19/2020 01/05/2020 11/10/2019 08/11/2019 05/24/2019  Decreased Interest 0 0 0 2 3  Down, Depressed, Hopeless 0 0 0 2 3  PHQ - 2 Score 0 0 0 4 6  Altered sleeping - - - 3 3  Tired, decreased energy - - - 3 3  Change in appetite - - - 3 1  Feeling bad or failure about yourself  - - - 1 1  Trouble concentrating - - - 3 0  Moving slowly  or fidgety/restless - - - 0 0  Suicidal thoughts - - - 0 0  PHQ-9 Score - - - 17 14  Difficult doing work/chores - - - Somewhat difficult Somewhat difficult    Functional Status Survey: Is the patient deaf or have difficulty hearing?: Yes Does the patient have difficulty seeing, even when wearing glasses/contacts?: Yes Does the patient have difficulty concentrating, remembering, or making decisions?: Yes Does the patient have difficulty walking or climbing stairs?: Yes Does the patient have difficulty dressing or bathing?: No Does the patient have difficulty doing errands alone such as visiting a doctor's office or shopping?: No  MMSE - Mini Mental State Exam 01/19/2020  Orientation to time 5  Orientation to Place 5  Registration 3  Attention/ Calculation 5  Recall 3  Language- name 2 objects 2  Language- repeat 1  Language- follow 3 step command 3  Language- read & follow direction 1  Write a sentence 1  Copy design 1  Total score 30    Fall Risk  01/19/2020 01/05/2020 11/10/2019 08/11/2019 05/24/2019  Falls in the past year? 0 1 0 1 1  Comment - - - - -  Number falls in past yr: 0 0 - 1 1  Comment - - - - -  Injury with Fall? 0 0 - 0 0  Comment - - - - -      LABS: Recent Results (from the past 2160 hour(s))  CULTURE, URINE COMPREHENSIVE     Status: Abnormal   Collection Time: 11/10/19  9:00 AM   Specimen: Urine   Urine  Result Value Ref Range   Urine Culture,  Comprehensive Final report (A)    Organism ID, Bacteria Escherichia coli (A)     Comment: Multi-Drug Resistant Organism Greater than 100,000 colony forming units per mL    ANTIMICROBIAL SUSCEPTIBILITY Comment     Comment:       ** S = Susceptible; I = Intermediate; R = Resistant **                    P = Positive; N = Negative             MICS are expressed in micrograms per mL    Antibiotic                 RSLT#1    RSLT#2    RSLT#3    RSLT#4 Amoxicillin/Clavulanic Acid    S Ampicillin                     R Cefazolin                      R Cefepime                       I Ceftriaxone                    R Cefuroxime                     R Ciprofloxacin                  R Ertapenem                      S Gentamicin                     S Imipenem  S Levofloxacin                   R Meropenem                      S Nitrofurantoin                 S Piperacillin/Tazobactam        S Tetracycline                   R Tobramycin                     S Trimethoprim/Sulfa             R   POCT Urinalysis Dipstick     Status: Abnormal   Collection Time: 11/10/19  9:30 AM  Result Value Ref Range   Color, UA     Clarity, UA     Glucose, UA Negative Negative   Bilirubin, UA Negative    Ketones, UA Negative    Spec Grav, UA 1.010 1.010 - 1.025   Blood, UA Trace    pH, UA 7.0 5.0 - 8.0   Protein, UA Negative Negative   Urobilinogen, UA 0.2 0.2 or 1.0 E.U./dL   Nitrite, UA Negative    Leukocytes, UA Large (3+) (A) Negative   Appearance     Odor    POCT HgB A1C     Status: None   Collection Time: 11/10/19  9:31 AM  Result Value Ref Range   Hemoglobin A1C 5.2 4.0 - 5.6 %   HbA1c POC (<> result, manual entry)     HbA1c, POC (prediabetic range)     HbA1c, POC (controlled diabetic range)    POCT Urinalysis Dipstick     Status: Abnormal   Collection Time: 01/05/20  8:44 AM  Result Value Ref Range   Color, UA     Clarity, UA     Glucose, UA Negative Negative    Bilirubin, UA neg    Ketones, UA neg    Spec Grav, UA 1.015 1.010 - 1.025   Blood, UA neg    pH, UA 5.0 5.0 - 8.0   Protein, UA Positive (A) Negative    Comment: trace   Urobilinogen, UA 0.2 0.2 or 1.0 E.U./dL   Nitrite, UA positive    Leukocytes, UA Large (3+) (A) Negative   Appearance     Odor    CULTURE, URINE COMPREHENSIVE     Status: Abnormal   Collection Time: 01/05/20  8:46 AM   Specimen: Urine   Urine  Result Value Ref Range   Urine Culture, Comprehensive Final report (A)    Organism ID, Bacteria Escherichia coli (A)     Comment: Multi-Drug Resistant Organism Greater than 100,000 colony forming units per mL    ANTIMICROBIAL SUSCEPTIBILITY Comment     Comment:       ** S = Susceptible; I = Intermediate; R = Resistant **                    P = Positive; N = Negative             MICS are expressed in micrograms per mL    Antibiotic                 RSLT#1    RSLT#2    RSLT#3    RSLT#4 Amoxicillin/Clavulanic Acid    R Ampicillin  I Cefepime                       S Ceftriaxone                    S Cefuroxime                     R Ciprofloxacin                  R Ertapenem                      S Gentamicin                     S Imipenem                       S Levofloxacin                   R Meropenem                      S Nitrofurantoin                 R Piperacillin/Tazobactam        S Tetracycline                   I Tobramycin                     S Trimethoprim/Sulfa             S   Urinalysis, Complete     Status: Abnormal   Collection Time: 01/18/20  2:08 PM  Result Value Ref Range   Specific Gravity, UA 1.015 1.005 - 1.030   pH, UA 6.0 5.0 - 7.5   Color, UA Yellow Yellow   Appearance Ur Hazy (A) Clear   Leukocytes,UA Negative Negative   Protein,UA Negative Negative/Trace   Glucose, UA Negative Negative   Ketones, UA Negative Negative   RBC, UA Negative Negative   Bilirubin, UA Negative Negative   Urobilinogen, Ur 0.2 0.2 - 1.0  mg/dL   Nitrite, UA Negative Negative   Microscopic Examination See below:   Microscopic Examination     Status: Abnormal   Collection Time: 01/18/20  2:08 PM   Urine  Result Value Ref Range   WBC, UA 0-5 0 - 5 /hpf   RBC 0-2 0 - 2 /hpf   Epithelial Cells (non renal) 0-10 0 - 10 /hpf   Renal Epithel, UA 0-10 (A) None seen /hpf   Casts Present (A) None seen /lpf   Cast Type Hyaline casts N/A   Bacteria, UA Few None seen/Few  BLADDER SCAN AMB NON-IMAGING     Status: None   Collection Time: 01/18/20  2:17 PM  Result Value Ref Range   Scan Result 60 ml   Comprehensive metabolic panel     Status: Abnormal   Collection Time: 01/24/20  3:50 PM  Result Value Ref Range   Glucose 105 (H) 65 - 99 mg/dL   BUN 23 8 - 27 mg/dL   Creatinine, Ser 1.65 (H) 0.57 - 1.00 mg/dL   GFR calc non Af Amer 28 (L) >59 mL/min/1.73   GFR calc Af Amer 32 (L) >59 mL/min/1.73    Comment: **[facility] currently reports eGFR in compliance with the current**   recommendations of the [employer].  [facility] will   update reporting as new guidelines are published from the NKF-ASN   Task force.    BUN/Creatinine Ratio 14 12 - 28   Sodium 136 134 - 144 mmol/L   Potassium 4.9 3.5 - 5.2 mmol/L   Chloride 100 96 - 106 mmol/L   CO2 23 20 - 29 mmol/L   Calcium 8.8 8.7 - 10.3 mg/dL   Total Protein 7.0 6.0 - 8.5 g/dL   Albumin 4.1 3.6 - 4.6 g/dL   Globulin, Total 2.9 1.5 - 4.5 g/dL   Albumin/Globulin Ratio 1.4 1.2 - 2.2   Bilirubin Total 0.3 0.0 - 1.2 mg/dL   Alkaline Phosphatase 64 44 - 121 IU/L    Comment:               **Please note reference interval change**   AST 25 0 - 40 IU/L   ALT 11 0 - 32 IU/L  CBC     Status: Abnormal   Collection Time: 01/24/20  3:50 PM  Result Value Ref Range   WBC 6.9 3.4 - 10.8 x10E3/uL   RBC 3.67 (L) 3.77 - 5.28 x10E6/uL   Hemoglobin 11.2 11.1 - 15.9 g/dL   Hematocrit 33.0 (L) 34.0 - 46.6 %   MCV 90 79 - 97 fL   MCH 30.5 26.6 - 33.0 pg   MCHC 33.9 31 - 35 g/dL    RDW 14.3 11.7 - 15.4 %   Platelets 153 150 - 450 x10E3/uL  Lipid Panel With LDL/HDL Ratio     Status: None   Collection Time: 01/24/20  3:50 PM  Result Value Ref Range   Cholesterol, Total 160 100 - 199 mg/dL   Triglycerides 107 0 - 149 mg/dL   HDL 54 >39 mg/dL   VLDL Cholesterol Cal 19 5 - 40 mg/dL   LDL Chol Calc (NIH) 87 0 - 99 mg/dL   LDL/HDL Ratio 1.6 0.0 - 3.2 ratio    Comment:                                     LDL/HDL Ratio                                             Men  Women                               1/2 Avg.Risk  1.0    1.5                                   Avg.Risk  3.6    3.2                                2X Avg.Risk  6.2    5.0                                3X Avg.Risk  8.0    6.1   Iron and TIBC     Status: Abnormal   Collection Time: 01/24/20  3:50 PM  Result Value  Ref Range   Total Iron Binding Capacity 226 (L) 250 - 450 ug/dL   UIBC 173 118 - 369 ug/dL   Iron 53 27 - 139 ug/dL   Iron Saturation 23 15 - 55 %  B12 and Folate Panel     Status: None   Collection Time: 01/24/20  3:50 PM  Result Value Ref Range   Vitamin B-12 280 232 - 1,245 pg/mL   Folate 12.3 >3.0 ng/mL    Comment: A serum folate concentration of less than 3.1 ng/mL is considered to represent clinical deficiency.   T4, free     Status: None   Collection Time: 01/24/20  3:50 PM  Result Value Ref Range   Free T4 1.35 0.82 - 1.77 ng/dL  TSH     Status: None   Collection Time: 01/24/20  3:50 PM  Result Value Ref Range   TSH 2.320 0.450 - 4.500 uIU/mL  VITAMIN D 25 Hydroxy (Vit-D Deficiency, Fractures)     Status: None   Collection Time: 01/24/20  3:50 PM  Result Value Ref Range   Vit D, 25-Hydroxy 34.4 30.0 - 100.0 ng/mL    Comment: Vitamin D deficiency has been defined by the [place] practice guideline as a level of serum 25-OH vitamin D less than 20 ng/mL (1,2). The Endocrine Society went on to further define vitamin D insufficiency as a level  between 21 and 29 ng/mL (2). 1. IOM (Institute of Medicine). 2010. Dietary reference    intakes for calcium and D. [place]: The    [employer]. 2. Holick MF, Binkley [place], Bischoff-Ferrari HA, et al.    Evaluation, treatment, and prevention of vitamin D    deficiency: an Endocrine Society clinical practice    guideline. JCEM. 2011 Jul; 96(7):1911-30.   RPR Qual     Status: None   Collection Time: 01/24/20  3:50 PM  Result Value Ref Range   RPR Ser Ql Non Reactive Non Reactive  Ferritin     Status: None   Collection Time: 01/24/20  3:50 PM  Result Value Ref Range   Ferritin 40 15.0 - 150.0 ng/mL    Assessment/Plan: 1. Encounter for general adult medical examination with abnormal findings Annual health maintenance exam today. Lab slip given to get routine, fasting done. Will also check dementia panel.   2. Essential hypertension bp generally stable. Continue meds as prescribed. Continue regular visits with cardiology as scheduled.   3. Bilateral carotid artery stenosis Reviewed carotid doppler with the patient and her daughter. There is mild plaque with <50% stenosis, bilaterally. Continue to take pravastatin and aspirin every day. Will monitor.  4. Disorientation Patient scheduled to have MRI of the brain later this week.   5. Abnormal weight loss Check thyroid panel with labs for further evaluation.   6. Needs flu shot Flu vaccine administered today.  - Flu Vaccine MDCK QUAD PF  General Counseling: jewelene mairena understanding of the findings of todays visit and agrees with plan of treatment. I have discussed any further diagnostic evaluation that may be needed or ordered today. We also reviewed her medications today. she has been encouraged to call the office with any questions or concerns that should arise related to todays visit.    Counseling:  This patient was seen by Leretha Pol FNP Collaboration with Dr Lavera Guise as a part of collaborative  care agreement  Orders Placed This Encounter  Procedures  . Flu Vaccine MDCK QUAD  PF      Total time spent: 25 Minutes  Time spent includes review of chart, medications, test results, and follow up plan with the patient.     Lavera Guise, MD  Internal Medicine

## 2020-01-19 NOTE — Procedures (Signed)
[place], [place] [address]  DATE OF SERVICE: 01/18/2020  CAROTID DOPPLER INTERPRETATION:  Bilateral Carotid Ultrsasound and Color Doppler Examination was performed. The RIGHT CCA shows mild plaque in the vessel. The LEFT CCA shows mild plaque in the vessel. There was no significant intimal thickening noted in the RIGHT carotid artery. There was no significant intimal thickening in the LEFT carotid artery.  The RIGHT CCA shows peak systolic velocity of 83 cm per second. The end diastolic velocity is 11 cm per second on the RIGHT side. The RIGHT ICA shows peak systolic velocity of 72 per second. RIGHT sided ICA end diastolic velocity is 16 cm per second. The RIGHT ECA shows a peak systolic velocity of 82 cm per second. The ICA/CCA ratio is calculated to be 1.2. This suggests less than 50% stenosis. The Vertebral Artery was not visualized to detect flow.  The LEFT CCA shows peak systolic velocity of 94 cm per second. The end diastolic velocity is 11 cm per second on the LEFT side. The LEFT ICA shows peak systolic velocity of 121 per second. LEFT sided ICA end diastolic velocity is 21 cm per second. The LEFT ECA shows a peak systolic velocity of 89 cm per second. The ICA/CCA ratio is calculated to be 0.91. This suggests less than 50% stenosis. The Vertebral Artery shows antegrade flow.   Impression:    The RIGHT CAROTID shows less than 50% stenosis. The LEFT CAROTID shows less than 50% stenosis.  There is mild plaque formation noted on the LEFT and mild plaque on the RIGHT  side. Consider a repeat Carotid doppler if clinical situation and symptoms warrant in 6-12 months. Patient should be encouraged to change lifestyles such as smoking cessation, regular exercise and dietary modification. Use of statins in the right clinical setting and ASA is encouraged.  Allyne Gee, MD [facility] Pulmonary Critical Care Medicine

## 2020-01-20 LAB — URINALYSIS, COMPLETE
Bilirubin, UA: NEGATIVE
Glucose, UA: NEGATIVE
Ketones, UA: NEGATIVE
Leukocytes,UA: NEGATIVE
Nitrite, UA: NEGATIVE
Protein,UA: NEGATIVE
RBC, UA: NEGATIVE
Specific Gravity, UA: 1.015 (ref 1.005–1.030)
Urobilinogen, Ur: 0.2 mg/dL (ref 0.2–1.0)
pH, UA: 6 (ref 5.0–7.5)

## 2020-01-20 LAB — MICROSCOPIC EXAMINATION

## 2020-01-24 ENCOUNTER — Ambulatory Visit
Admission: RE | Admit: 2020-01-24 | Discharge: 2020-01-24 | Disposition: A | Discharge status: Home / Self Care | Payer: Medicare Other | Source: Ambulatory Visit | Attending: Nurse Practitioner | Admitting: Nurse Practitioner

## 2020-01-24 ENCOUNTER — Other Ambulatory Visit: Payer: Self-pay | Admitting: Nurse Practitioner

## 2020-01-24 ENCOUNTER — Other Ambulatory Visit: Payer: Self-pay

## 2020-01-24 DIAGNOSIS — D510 Vitamin B12 deficiency anemia due to intrinsic factor deficiency: Secondary | ICD-10-CM | POA: Diagnosis not present

## 2020-01-24 DIAGNOSIS — G319 Degenerative disease of nervous system, unspecified: Secondary | ICD-10-CM | POA: Diagnosis not present

## 2020-01-24 DIAGNOSIS — I6782 Cerebral ischemia: Secondary | ICD-10-CM | POA: Diagnosis not present

## 2020-01-24 DIAGNOSIS — E039 Hypothyroidism, unspecified: Secondary | ICD-10-CM | POA: Diagnosis not present

## 2020-01-24 DIAGNOSIS — R410 Disorientation, unspecified: Secondary | ICD-10-CM | POA: Diagnosis not present

## 2020-01-24 DIAGNOSIS — D509 Iron deficiency anemia, unspecified: Secondary | ICD-10-CM | POA: Diagnosis not present

## 2020-01-24 DIAGNOSIS — R4182 Altered mental status, unspecified: Secondary | ICD-10-CM | POA: Diagnosis not present

## 2020-01-24 DIAGNOSIS — D513 Other dietary vitamin B12 deficiency anemia: Secondary | ICD-10-CM | POA: Diagnosis not present

## 2020-01-24 DIAGNOSIS — R42 Dizziness and giddiness: Secondary | ICD-10-CM | POA: Insufficient documentation

## 2020-01-24 DIAGNOSIS — E559 Vitamin D deficiency, unspecified: Secondary | ICD-10-CM | POA: Diagnosis not present

## 2020-01-24 DIAGNOSIS — Z0001 Encounter for general adult medical examination with abnormal findings: Secondary | ICD-10-CM | POA: Diagnosis not present

## 2020-01-24 DIAGNOSIS — R634 Abnormal weight loss: Secondary | ICD-10-CM | POA: Diagnosis not present

## 2020-01-25 LAB — CBC
Hematocrit: 33 % — ABNORMAL LOW (ref 34.0–46.6)
Hemoglobin: 11.2 g/dL (ref 11.1–15.9)
MCH: 30.5 pg (ref 26.6–33.0)
MCHC: 33.9 g/dL (ref 31.5–35.7)
MCV: 90 fL (ref 79–97)
Platelets: 153 10*3/uL (ref 150–450)
RBC: 3.67 x10E6/uL — ABNORMAL LOW (ref 3.77–5.28)
RDW: 14.3 % (ref 11.7–15.4)
WBC: 6.9 10*3/uL (ref 3.4–10.8)

## 2020-01-25 LAB — LIPID PANEL WITH LDL/HDL RATIO
Cholesterol, Total: 160 mg/dL (ref 100–199)
HDL: 54 mg/dL (ref >39)
LDL Chol Calc (NIH): 87 mg/dL (ref 0–99)
LDL/HDL Ratio: 1.6 ratio (ref 0.0–3.2)
Triglycerides: 107 mg/dL (ref 0–149)
VLDL Cholesterol Cal: 19 mg/dL (ref 5–40)

## 2020-01-25 LAB — COMPREHENSIVE METABOLIC PANEL
ALT: 11 IU/L (ref 0–32)
AST: 25 IU/L (ref 0–40)
Albumin/Globulin Ratio: 1.4 (ref 1.2–2.2)
Albumin: 4.1 g/dL (ref 3.6–4.6)
Alkaline Phosphatase: 64 IU/L (ref 44–121)
BUN/Creatinine Ratio: 14 (ref 12–28)
BUN: 23 mg/dL (ref 8–27)
Bilirubin Total: 0.3 mg/dL (ref 0.0–1.2)
CO2: 23 mmol/L (ref 20–29)
Calcium: 8.8 mg/dL (ref 8.7–10.3)
Chloride: 100 mmol/L (ref 96–106)
Creatinine, Ser: 1.65 mg/dL — ABNORMAL HIGH (ref 0.57–1.00)
GFR calc Af Amer: 32 mL/min/{1.73_m2} — ABNORMAL LOW (ref >59)
GFR calc non Af Amer: 28 mL/min/{1.73_m2} — ABNORMAL LOW (ref >59)
Globulin, Total: 2.9 g/dL (ref 1.5–4.5)
Glucose: 105 mg/dL — ABNORMAL HIGH (ref 65–99)
Potassium: 4.9 mmol/L (ref 3.5–5.2)
Sodium: 136 mmol/L (ref 134–144)
Total Protein: 7 g/dL (ref 6.0–8.5)

## 2020-01-25 LAB — T4, FREE: Free T4: 1.35 ng/dL (ref 0.82–1.77)

## 2020-01-25 LAB — IRON AND TIBC
Iron Saturation: 23 % (ref 15–55)
Iron: 53 ug/dL (ref 27–139)
Total Iron Binding Capacity: 226 ug/dL — ABNORMAL LOW (ref 250–450)
UIBC: 173 ug/dL (ref 118–369)

## 2020-01-25 LAB — RPR QUALITATIVE: RPR Ser Ql: NONREACTIVE

## 2020-01-25 LAB — B12 AND FOLATE PANEL
Folate: 12.3 ng/mL (ref >3.0)
Vitamin B-12: 280 pg/mL (ref 232–1245)

## 2020-01-25 LAB — VITAMIN D 25 HYDROXY (VIT D DEFICIENCY, FRACTURES): Vit D, 25-Hydroxy: 34.4 ng/mL (ref 30.0–100.0)

## 2020-01-25 LAB — FERRITIN: Ferritin: 40 ng/mL (ref 15–150)

## 2020-01-25 LAB — TSH: TSH: 2.32 u[IU]/mL (ref 0.450–4.500)

## 2020-01-25 NOTE — Progress Notes (Signed)
Review at visit 02/10/2020

## 2020-01-25 NOTE — Progress Notes (Signed)
No acute abnormalities. Small microhemorrhages present. Discuss at follow up 02/10/2020

## 2020-01-25 NOTE — Progress Notes (Signed)
Mild plaque with <50% stenosis, bilaterally. Review at visit 02/10/2020

## 2020-02-01 ENCOUNTER — Other Ambulatory Visit: Payer: Self-pay

## 2020-02-01 DIAGNOSIS — G4701 Insomnia due to medical condition: Secondary | ICD-10-CM

## 2020-02-01 MED ORDER — QUETIAPINE FUMARATE 25 MG PO TABS: 25.0000 mg | ORAL_TABLET | Freq: Every day | ORAL | 2 refills | Status: DC

## 2020-02-04 DIAGNOSIS — Z23 Encounter for immunization: Secondary | ICD-10-CM | POA: Insufficient documentation

## 2020-02-04 DIAGNOSIS — R634 Abnormal weight loss: Secondary | ICD-10-CM | POA: Insufficient documentation

## 2020-02-10 ENCOUNTER — Ambulatory Visit (INDEPENDENT_AMBULATORY_CARE_PROVIDER_SITE_OTHER): Payer: Medicare Other | Admitting: Nurse Practitioner

## 2020-02-10 ENCOUNTER — Other Ambulatory Visit: Payer: Self-pay

## 2020-02-10 ENCOUNTER — Encounter: Payer: Self-pay | Admitting: Nurse Practitioner

## 2020-02-10 VITALS — BP 138/60 | HR 60 | Temp 97.4°F | Resp 16 | Ht 60.0 in | Wt 140.8 lb

## 2020-02-10 DIAGNOSIS — N390 Urinary tract infection, site not specified: Secondary | ICD-10-CM | POA: Diagnosis not present

## 2020-02-10 DIAGNOSIS — R634 Abnormal weight loss: Secondary | ICD-10-CM | POA: Diagnosis not present

## 2020-02-10 DIAGNOSIS — I679 Cerebrovascular disease, unspecified: Secondary | ICD-10-CM

## 2020-02-10 DIAGNOSIS — I6523 Occlusion and stenosis of bilateral carotid arteries: Secondary | ICD-10-CM

## 2020-02-10 DIAGNOSIS — R440 Auditory hallucinations: Secondary | ICD-10-CM | POA: Diagnosis not present

## 2020-02-10 DIAGNOSIS — I10 Essential (primary) hypertension: Secondary | ICD-10-CM | POA: Diagnosis not present

## 2020-02-10 NOTE — Progress Notes (Signed)
[facility] [place], [place] [address]  Internal MEDICINE  Office Visit Note  Patient Name: Maria Sanchez  [id number]  [id number]  Date of Service: 02/19/2020  Chief Complaint  Patient presents with  . Follow-up    review results  . Hyperlipidemia  . Hypertension  . policy up date form    reviewed    The patient is here follow up visit. Patient having declining neurological function. MRI showing multiple, remote microhemorrhages. Normal labs. Carotid doppler showing mild plaque with <50% stenosis. She continues to have auditory hallucinations. She is consistently hearing two voices who call her name and accuse the patient of having their stuff. These are previuos neighbors who used to live blow her in her home. They have not lived there in some time. The patient is able to get around on her own at home. She is able to prepare food, clean her home, and do her own laundry. This is only effecting her ability to rest and relax and her ability to sleep well at night. She has seen a urologist since her most recent visit due to persistent urinary tract infection. Urologist plans to keep her on macrobid daily for next few months to prevent further infection.       Current Medication: Outpatient Encounter Medications as of 02/10/2020  Medication Sig  . allopurinol (ZYLOPRIM) 100 MG tablet TAKE 2 TABLETS BY MOUTH AT BEDTIME AS NEEDED FOR GOUT  . amLODipine (NORVASC) 5 MG tablet Take 1 tablet (5 mg total) by mouth daily.  Marland Kitchen aspirin EC 81 MG tablet Take 81 mg daily by mouth.  . escitalopram (LEXAPRO) 10 MG tablet TAKE 1 TABLET BY MOUTH EVERY DAY WITH SUPPER  . hydrALAZINE (APRESOLINE) 25 MG tablet TAKE 1 TABLET BY MOUTH 3 TIMES A DAY FOR BLOOD PRESSURE  . levothyroxine (SYNTHROID) 112 MCG tablet Take 1 tablet (112 mcg total) by mouth daily before breakfast.  . metoprolol succinate (TOPROL-XL) 25 MG 24 hr tablet Take 1 tablet (25 mg total) by mouth at bedtime.  .  Multiple Vitamins-Minerals (PRESERVISION AREDS 2) CAPS Take by mouth 2 (two) times daily.  . pravastatin (PRAVACHOL) 10 MG tablet Take 1 tablet (10 mg total) by mouth at bedtime.  Marland Kitchen QUEtiapine (SEROQUEL) 25 MG tablet Take 1 tablet (25 mg total) by mouth at bedtime.  . sulfamethoxazole-trimethoprim (BACTRIM) 400-80 MG tablet Take 1 tablet po BID for 10 days, then take 1 tablet po QD to prevent further infection   No facility-administered encounter medications on file as of 02/10/2020.    Surgical History: Past Surgical History:  Procedure Laterality Date  . ABDOMINAL HYSTERECTOMY    . BREAST SURGERY     benign  . CHOLECYSTECTOMY    . CORONARY ARTERY BYPASS GRAFT    . exicision salivary gland    . MITRAL VALVE REPLACEMENT (MVR)/CORONARY ARTERY BYPASS GRAFTING (CABG)    . posterolateral fusion    . stent placed in kidney Right   . thumb arthroplasty  06/20/2005    Medical History: Past Medical History:  Diagnosis Date  . Arthritis   . CAD (coronary artery disease)   . Hyperlipidemia   . Hypertension   . Renal artery stenosis ([place])   . Thyroid disease     Family History: Family History  Problem Relation Age of Onset  . Colon cancer Mother   . Breast cancer Sister   . Colon cancer Brother     Social History   Socioeconomic History  .  Marital status: Widowed    Spouse name: Not on file  . Number of children: Not on file  . Years of education: Not on file  . Highest education level: Not on file  Occupational History  . Not on file  Tobacco Use  . Smoking status: Never Smoker  . Smokeless tobacco: Never Used  Vaping Use  . Vaping Use: Never used  Substance and Sexual Activity  . Alcohol use: No  . Drug use: No  . Sexual activity: Never  Other Topics Concern  . Not on file  Social History Narrative  . Not on file   Social Determinants of Health   Financial Resource Strain:   . Difficulty of Paying Living Expenses: Not on file  Food Insecurity:   . Worried  About Charity fundraiser in the Last Year: Not on file  . Ran Out of Food in the Last Year: Not on file  Transportation Needs:   . Lack of Transportation (Medical): Not on file  . Lack of Transportation (Non-Medical): Not on file  Physical Activity:   . Days of Exercise per Week: Not on file  . Minutes of Exercise per Session: Not on file  Stress:   . Feeling of Stress : Not on file  Social Connections:   . Frequency of Communication with Friends and Family: Not on file  . Frequency of Social Gatherings with Friends and Family: Not on file  . Attends Religious Services: Not on file  . Active Member of Clubs or Organizations: Not on file  . Attends Archivist Meetings: Not on file  . Marital Status: Not on file  Intimate Partner Violence:   . Fear of Current or Ex-Partner: Not on file  . Emotionally Abused: Not on file  . Physically Abused: Not on file  . Sexually Abused: Not on file      Review of Systems  Constitutional: Positive for appetite change. Negative for activity change, chills, fatigue and unexpected weight change.       Improved appetite. Two pound weight gain since her most recent visit.   HENT: Negative for congestion, postnasal drip, rhinorrhea, sneezing and sore throat.   Respiratory: Negative for cough, chest tightness, shortness of breath and wheezing.   Cardiovascular: Negative for chest pain and palpitations.  Gastrointestinal: Negative for abdominal pain, constipation, diarrhea, nausea and vomiting.  Endocrine: Negative for cold intolerance, heat intolerance, polydipsia and polyuria.  Genitourinary: Negative for dysuria, frequency and urgency.       Recurrent UTI. Patient now seeing urology  Musculoskeletal: Negative for arthralgias, back pain, joint swelling and neck pain.  Skin: Positive for wound. Negative for rash.  Allergic/Immunologic: Negative for environmental allergies.  Neurological: Positive for dizziness and speech difficulty.  Negative for tremors, syncope, light-headedness and numbness.       Improved weakness and dizziness.   Hematological: Negative for adenopathy. Does not bruise/bleed easily.  Psychiatric/Behavioral: Positive for confusion and hallucinations. Negative for behavioral problems (Depression), sleep disturbance and suicidal ideas. The patient is nervous/anxious.        Auditory hallucinations    Today's Vitals   02/10/20 0844  BP: 138/60  Pulse: 60  Resp: 16  Temp: (!) 97.4 F (36.3 C)  SpO2: 98%  Weight: 140 lb 12.8 oz (63.9 kg)  Height: 5' (1.524 m)   Body mass index is 27.5 kg/m.  Physical Exam Vitals and nursing note reviewed.  Constitutional:      General: She is not in acute distress.  Appearance: Normal appearance. She is well-developed. She is not diaphoretic.  HENT:     Head: Normocephalic and atraumatic.     Nose: Nose normal.     Mouth/Throat:     Pharynx: No oropharyngeal exudate.  Eyes:     Pupils: Pupils are equal, round, and reactive to light.  Neck:     Thyroid: No thyromegaly.     Vascular: Carotid bruit present. No JVD.     Trachea: No tracheal deviation.     Comments: Very soft, blowing carotid bruit on left side of neck.  Cardiovascular:     Rate and Rhythm: Normal rate and regular rhythm.     Heart sounds: Murmur heard.  No friction rub. No gallop.   Pulmonary:     Effort: Pulmonary effort is normal. No respiratory distress.     Breath sounds: Normal breath sounds. No wheezing or rales.  Chest:     Chest wall: No tenderness.  Abdominal:     General: Bowel sounds are normal.     Palpations: Abdomen is soft.     Tenderness: There is no abdominal tenderness.  Musculoskeletal:        General: Normal range of motion.     Cervical back: Normal range of motion and neck supple.  Lymphadenopathy:     Cervical: No cervical adenopathy.  Skin:    General: Skin is warm and dry.  Neurological:     Mental Status: She is alert and oriented to person, place,  and time. Mental status is at baseline.     Cranial Nerves: No cranial nerve deficit.  Psychiatric:        Mood and Affect: Mood normal.        Behavior: Behavior normal.        Thought Content: Thought content normal.        Judgment: Judgment normal.    Assessment/Plan: 1. Cerebrovascular small vessel disease Reviewed MRI of brain with the patient and her daughter. There are remote right cerebellar and left caudate insults. There is mild cerebral atrophy and chronic microvascular ischemic changes. There are a few scattered cerebral and right cerebellar remote microhemorrhages. Will refer to neurology for further evaluation.  - Ambulatory referral to Neurology  2. Auditory hallucination Suspect result of cerebrovascular small vessel disease. Referral to neurology for further evaluation.   3. Abnormal weight loss Improving. Appetite improving. Two pound weight gain since most recent visit. Will continue to monitor.   4. Recurrent UTI (urinary tract infection) Continue with daily macrobid. Follow up with urology as scheduled.   5. Essential hypertension Stable. Continue bp medication as prescribed.   6. Bilateral carotid artery stenosis Reviewed carotid doppler study with the patient and her daughter. There is mild plaque and <50% stenosis, bilaterally. Will continue pravastatin and baby aspirin daily. Repeat study in one year for surveillance.   General Counseling: teyanna thielman understanding of the findings of todays visit and agrees with plan of treatment. I have discussed any further diagnostic evaluation that may be needed or ordered today. We also reviewed her medications today. she has been encouraged to call the office with any questions or concerns that should arise related to todays visit.  This patient was seen by Leretha Pol FNP Collaboration with Dr Lavera Guise as a part of collaborative care agreement  Orders Placed This Encounter  Procedures  . Ambulatory  referral to Neurology      Total time spent: 30 Minutes   Time spent includes review of  chart, medications, test results, and follow up plan with the patient.      Dr Lavera Guise Internal medicine

## 2020-02-19 DIAGNOSIS — I679 Cerebrovascular disease, unspecified: Secondary | ICD-10-CM | POA: Insufficient documentation

## 2020-02-27 ENCOUNTER — Other Ambulatory Visit: Payer: Self-pay

## 2020-02-29 ENCOUNTER — Other Ambulatory Visit: Payer: Self-pay

## 2020-02-29 DIAGNOSIS — I10 Essential (primary) hypertension: Secondary | ICD-10-CM

## 2020-02-29 MED ORDER — METOPROLOL SUCCINATE ER 25 MG PO TB24: 25.0000 mg | ORAL_TABLET | Freq: Every day | ORAL | 1 refills | Status: DC

## 2020-03-01 ENCOUNTER — Other Ambulatory Visit: Payer: Self-pay

## 2020-03-01 DIAGNOSIS — M1A9XX0 Chronic gout, unspecified, without tophus (tophi): Secondary | ICD-10-CM

## 2020-03-01 DIAGNOSIS — R413 Other amnesia: Secondary | ICD-10-CM | POA: Insufficient documentation

## 2020-03-01 DIAGNOSIS — R443 Hallucinations, unspecified: Secondary | ICD-10-CM | POA: Diagnosis not present

## 2020-03-01 DIAGNOSIS — E782 Mixed hyperlipidemia: Secondary | ICD-10-CM

## 2020-03-01 MED ORDER — PRAVASTATIN SODIUM 10 MG PO TABS: 10.0000 mg | ORAL_TABLET | Freq: Every day | ORAL | 11 refills | Status: DC

## 2020-03-01 MED ORDER — ALLOPURINOL 100 MG PO TABS: ORAL_TABLET | ORAL | 3 refills | Status: DC

## 2020-04-18 ENCOUNTER — Other Ambulatory Visit: Payer: Self-pay

## 2020-04-18 ENCOUNTER — Encounter: Payer: Self-pay | Admitting: Urology

## 2020-04-18 ENCOUNTER — Ambulatory Visit (INDEPENDENT_AMBULATORY_CARE_PROVIDER_SITE_OTHER): Payer: Medicare Other | Admitting: Urology

## 2020-04-18 VITALS — BP 128/72 | HR 69 | Ht 60.0 in | Wt 141.3 lb

## 2020-04-18 DIAGNOSIS — N390 Urinary tract infection, site not specified: Secondary | ICD-10-CM | POA: Diagnosis not present

## 2020-04-18 LAB — BLADDER SCAN AMB NON-IMAGING

## 2020-04-18 MED ORDER — ESTRADIOL 0.1 MG/GM VA CREA: TOPICAL_CREAM | VAGINAL | 12 refills | Status: DC

## 2020-04-18 NOTE — Progress Notes (Signed)
   04/18/2020 4:58 PM   [name] [birth year] [id number]  Reason for visit: Follow up recurrent UTIs  HPI: I saw Ms. Poppell and her daughter back in urology clinic for recurrent UTI.  Briefly, she is a comorbid 86 year old female with CAD, history of renal artery stenosis and atrophic right kidney, who has had 4 positive urine cultures over a 54-month period including Klebsiella and E. coli with multiple resistances.  She denies any symptoms of dysuria, urgency, frequency, or pelvic pain, however her daughter reports worsening confusion as her symptomatic features of a UTI.  She does think her confusion improves after taking antibiotics.  At our last visit, we opted for a trial of daily Bactrim prophylaxis for 3 months, cranberry tablets, and topical estrogen cream.  She has had no problems over the last 3 months with any urinary symptoms, infections, or confusion attributed to UTIs.  I recommended continuing the topical estrogen cream and cranberry tablets for UTI prophylaxis.  I think it is reasonable to discontinue the daily Bactrim prophylaxis and see how she does.  If she develops recurrent infections and confusion, we could certainly restart this in the future.  Return precautions discussed.  We also reviewed the difference between asymptomatic bacteriuria and true UTI.  Continue topical estrogen cream and cranberry tablets, discontinue Bactrim daily prophylaxis RTC 9 months symptom check   Billey Co, MD  [facility] [address] [place], [place] [address] [phone]

## 2020-04-18 NOTE — Patient Instructions (Addendum)
Continue topical estrogen cream at the opening of the urethra 3 times per week to help prevent infections.  Continue cranberry tablets daily to prevent infections.  Call the clinic if you have recurrence of UTIs.  Okay to discontinue Bactrim daily antibiotic.

## 2020-05-09 DIAGNOSIS — G309 Alzheimer's disease, unspecified: Secondary | ICD-10-CM | POA: Diagnosis not present

## 2020-05-09 DIAGNOSIS — F0281 Dementia in other diseases classified elsewhere with behavioral disturbance: Secondary | ICD-10-CM | POA: Diagnosis not present

## 2020-05-09 DIAGNOSIS — R440 Auditory hallucinations: Secondary | ICD-10-CM | POA: Diagnosis not present

## 2020-05-09 DIAGNOSIS — F0280 Dementia in other diseases classified elsewhere without behavioral disturbance: Secondary | ICD-10-CM | POA: Diagnosis not present

## 2020-05-09 DIAGNOSIS — R413 Other amnesia: Secondary | ICD-10-CM | POA: Diagnosis not present

## 2020-05-09 DIAGNOSIS — E1165 Type 2 diabetes mellitus with hyperglycemia: Secondary | ICD-10-CM | POA: Diagnosis not present

## 2020-05-11 ENCOUNTER — Ambulatory Visit: Payer: Medicare Other | Admitting: Nurse Practitioner

## 2020-06-04 ENCOUNTER — Encounter: Payer: Self-pay | Admitting: Internal Medicine

## 2020-06-04 ENCOUNTER — Ambulatory Visit (INDEPENDENT_AMBULATORY_CARE_PROVIDER_SITE_OTHER): Payer: Medicare Other | Admitting: Internal Medicine

## 2020-06-04 VITALS — Temp 98.8°F | Ht 60.0 in | Wt 140.0 lb

## 2020-06-04 DIAGNOSIS — U071 COVID-19: Secondary | ICD-10-CM | POA: Diagnosis not present

## 2020-06-04 NOTE — Progress Notes (Signed)
[facility] [place], [place] [address]  Internal MEDICINE  Telephone Visit  Patient Name: Maria Sanchez  [id number]  [id number]  Date of Service: 06/04/2020  I connected with the patient at  31 30 am by telephone and verified the patients identity using two identifiers.   I discussed the limitations, risks, security and privacy concerns of performing an evaluation and management service by telephone and the availability of in person appointments. I also discussed with the patient that there may be a patient responsible charge related to the service.  The patient expressed understanding and agrees to proceed.    Chief Complaint  Patient presents with  . Telephone Screen    [id number]  . Sinusitis  . Cough    Home covid  test positive   . Telephone Assessment    HPI  Pt is connected for concerns of COVID positive test at home, she feels well, has been vaccinated and booster as well, denies any fever or chills    Current Medication: Outpatient Encounter Medications as of 06/04/2020  Medication Sig  . allopurinol (ZYLOPRIM) 100 MG tablet TAKE 2 TABLETS BY MOUTH AT BEDTIME AS NEEDED FOR GOUT  . amLODipine (NORVASC) 5 MG tablet Take 1 tablet (5 mg total) by mouth daily.  Marland Kitchen aspirin EC 81 MG tablet Take 81 mg daily by mouth.  . escitalopram (LEXAPRO) 10 MG tablet TAKE 1 TABLET BY MOUTH EVERY DAY WITH SUPPER  . estradiol (ESTRACE) 0.1 MG/GM vaginal cream Estrogen Cream Instruction Discard applicator Apply pea sized amount to tip of finger to urethra before bed. Wash hands well after application. Use Monday, Wednesday and Friday  . hydrALAZINE (APRESOLINE) 25 MG tablet TAKE 1 TABLET BY MOUTH 3 TIMES A DAY FOR BLOOD PRESSURE  . levothyroxine (SYNTHROID) 112 MCG tablet Take 1 tablet (112 mcg total) by mouth daily before breakfast.  . metoprolol succinate (TOPROL-XL) 25 MG 24 hr tablet Take 1 tablet (25 mg total) by mouth at bedtime.  . Multiple Vitamins-Minerals  (PRESERVISION AREDS 2) CAPS Take by mouth 2 (two) times daily.  . pravastatin (PRAVACHOL) 10 MG tablet Take 1 tablet (10 mg total) by mouth at bedtime.  . risperiDONE (RISPERDAL) 0.5 MG tablet Take 1.5 tab  po once a daily  . [DISCONTINUED] sulfamethoxazole-trimethoprim (BACTRIM) 400-80 MG tablet Take 1 tablet po BID for 10 days, then take 1 tablet po QD to prevent further infection (Patient not taking: Reported on 06/04/2020)   No facility-administered encounter medications on file as of 06/04/2020.    Surgical History: Past Surgical History:  Procedure Laterality Date  . ABDOMINAL HYSTERECTOMY    . BREAST SURGERY     benign  . CHOLECYSTECTOMY    . CORONARY ARTERY BYPASS GRAFT    . exicision salivary gland    . MITRAL VALVE REPLACEMENT (MVR)/CORONARY ARTERY BYPASS GRAFTING (CABG)    . posterolateral fusion    . stent placed in kidney Right   . thumb arthroplasty  06/20/2005    Medical History: Past Medical History:  Diagnosis Date  . Arthritis   . CAD (coronary artery disease)   . Hyperlipidemia   . Hypertension   . Renal artery stenosis ([place])   . Thyroid disease     Family History: Family History  Problem Relation Age of Onset  . Colon cancer Mother   . Breast cancer Sister   . Colon cancer Brother     Social History   Socioeconomic History  . Marital status: Widowed  Spouse name: Not on file  . Number of children: Not on file  . Years of education: Not on file  . Highest education level: Not on file  Occupational History  . Not on file  Tobacco Use  . Smoking status: Never Smoker  . Smokeless tobacco: Never Used  Vaping Use  . Vaping Use: Never used  Substance and Sexual Activity  . Alcohol use: No  . Drug use: No  . Sexual activity: Never  Other Topics Concern  . Not on file  Social History Narrative  . Not on file   Social Determinants of Health   Financial Resource Strain: Not on file  Food Insecurity: Not on file  Transportation Needs: Not on  file  Physical Activity: Not on file  Stress: Not on file  Social Connections: Not on file  Intimate Partner Violence: Not on file      Review of Systems  Constitutional: Negative for fatigue and fever.  HENT: Negative for congestion, mouth sores and postnasal drip.   Respiratory: Negative for cough.   Cardiovascular: Negative for chest pain.  Genitourinary: Negative for flank pain.  Psychiatric/Behavioral: Negative.     Vital Signs: Temp 98.8 F (37.1 C)   Ht 5' (1.524 m)   Wt 140 lb (63.5 kg)   BMI 27.34 kg/m    Observation/Objective: Looks well, no apparent distress    Assessment/Plan: 1. COVID self-quarantine for 7 days and an additional 3 days after respiratory symptoms resolve. Everyone in his home should be vigilant about social distancing, and may benefit from self-quarantining for next 10 days. Take tylenol as needed for fever and headache. Contact PCP or ER if symptoms get worse or do not get better over next several days.   General Counseling: Maria Sanchez understanding of the findings of today's phone visit and agrees with plan of treatment. I have discussed any further diagnostic evaluation that may be needed or ordered today. We also reviewed her medications today. she has been encouraged to call the office with any questions or concerns that should arise related to todays visit.   Time spent: [facility]

## 2020-06-07 ENCOUNTER — Other Ambulatory Visit: Payer: Self-pay

## 2020-06-07 MED ORDER — AZITHROMYCIN 250 MG PO TABS: 250.0000 mg | ORAL_TABLET | Freq: Every day | ORAL | 0 refills | Status: AC

## 2020-06-07 MED ORDER — PREDNISONE 10 MG PO TABS: ORAL_TABLET | ORAL | 0 refills | Status: DC

## 2020-06-15 ENCOUNTER — Telehealth: Payer: Self-pay

## 2020-06-15 MED ORDER — SULFAMETHOXAZOLE-TRIMETHOPRIM 400-80 MG PO TABS: 1.0000 | ORAL_TABLET | Freq: Two times a day (BID) | ORAL | 0 refills | Status: DC

## 2020-06-15 NOTE — Telephone Encounter (Signed)
Pt's daughter Maria Sanchez called advising that pt was having a lot of confusion and not knowing where she is at.  Pt usually gets like that when she is having a UTI.  Per DFK we sent in Bactrim 400-80 mg BID for 10 days to [facility] and pt is to make appt for 06/19/20 with DFK to be seen

## 2020-06-19 ENCOUNTER — Other Ambulatory Visit: Payer: Self-pay

## 2020-06-19 ENCOUNTER — Encounter: Payer: Self-pay | Admitting: Internal Medicine

## 2020-06-19 ENCOUNTER — Ambulatory Visit (INDEPENDENT_AMBULATORY_CARE_PROVIDER_SITE_OTHER): Payer: Medicare Other | Admitting: Internal Medicine

## 2020-06-19 VITALS — BP 120/70 | HR 67 | Temp 97.5°F | Resp 16 | Ht 60.0 in | Wt 141.8 lb

## 2020-06-19 DIAGNOSIS — R300 Dysuria: Secondary | ICD-10-CM | POA: Diagnosis not present

## 2020-06-19 DIAGNOSIS — U071 COVID-19: Secondary | ICD-10-CM | POA: Diagnosis not present

## 2020-06-19 DIAGNOSIS — F0150 Vascular dementia without behavioral disturbance: Secondary | ICD-10-CM | POA: Diagnosis not present

## 2020-06-19 DIAGNOSIS — N3000 Acute cystitis without hematuria: Secondary | ICD-10-CM | POA: Diagnosis not present

## 2020-06-19 LAB — POCT URINALYSIS DIPSTICK
Bilirubin, UA: NEGATIVE
Blood, UA: NEGATIVE
Glucose, UA: NEGATIVE
Ketones, UA: NEGATIVE
Leukocytes, UA: NEGATIVE
Nitrite, UA: POSITIVE
Protein, UA: NEGATIVE
Spec Grav, UA: 1.015 (ref 1.010–1.025)
Urobilinogen, UA: 0.2 E.U./dL
pH, UA: 7 (ref 5.0–8.0)

## 2020-06-19 NOTE — Progress Notes (Signed)
[facility] [place], [place] [address]  Internal MEDICINE  Office Visit Note  Patient Name: Maria Sanchez  [id number]  [id number]  Date of Service: 06/20/2020  Chief Complaint  Patient presents with  . Acute Visit  . Urinary Tract Infection    HPI Maria Sanchez is here for follow up Maria Sanchez is treated for UTI due to symptoms. Feels better, Maria Sanchez is in the room with Maria Sanchez daughter, unable to give proper history due to dementia. Does not drink enough water. Has been maintained on Macrobid due to chronic UTI now has MRD bacterial growth  Was started on bactrim for the same reason  Maria Sanchez is recovering from [place] as well, inially had sinus congestion but problem with breathing  Other problems include HTN CKD ( has h/o nephrectomy) Current Medication: Outpatient Encounter Medications as of 06/19/2020  Medication Sig  . allopurinol (ZYLOPRIM) 100 MG tablet TAKE 2 TABLETS BY MOUTH AT BEDTIME AS NEEDED FOR GOUT  . amLODipine (NORVASC) 5 MG tablet Take 1 tablet (5 mg total) by mouth daily.  Marland Kitchen aspirin EC 81 MG tablet Take 81 mg daily by mouth.  . escitalopram (LEXAPRO) 10 MG tablet TAKE 1 TABLET BY MOUTH EVERY DAY WITH SUPPER  . estradiol (ESTRACE) 0.1 MG/GM vaginal cream Estrogen Cream Instruction Discard applicator Apply pea sized amount to tip of finger to urethra before bed. Wash hands well after application. Use Monday, Wednesday and Friday  . hydrALAZINE (APRESOLINE) 25 MG tablet TAKE 1 TABLET BY MOUTH 3 TIMES A DAY FOR BLOOD PRESSURE  . levothyroxine (SYNTHROID) 112 MCG tablet Take 1 tablet (112 mcg total) by mouth daily before breakfast.  . metoprolol succinate (TOPROL-XL) 25 MG 24 hr tablet Take 1 tablet (25 mg total) by mouth at bedtime.  . Multiple Vitamins-Minerals (PRESERVISION AREDS 2) CAPS Take by mouth 2 (two) times daily.  . pravastatin (PRAVACHOL) 10 MG tablet Take 1 tablet (10 mg total) by mouth at bedtime.  . predniSONE (DELTASONE) 10 MG tablet Take 1 tab po 3 x day  for 3 dTays then take 1 tab po 2 x a day for 3 days and then take 1 tab po daily for 3 days  . risperiDONE (RISPERDAL) 0.5 MG tablet Take 1.5 tab  po once a daily  . sulfamethoxazole-trimethoprim (BACTRIM) 400-80 MG tablet Take 1 tablet by mouth 2 (two) times daily.   No facility-administered encounter medications on file as of 06/19/2020.    Surgical History: Past Surgical History:  Procedure Laterality Date  . ABDOMINAL HYSTERECTOMY    . BREAST SURGERY     benign  . CHOLECYSTECTOMY    . CORONARY ARTERY BYPASS GRAFT    . exicision salivary gland    . MITRAL VALVE REPLACEMENT (MVR)/CORONARY ARTERY BYPASS GRAFTING (CABG)    . posterolateral fusion    . stent placed in kidney Right   . thumb arthroplasty  06/20/2005    Medical History: Past Medical History:  Diagnosis Date  . Arthritis   . CAD (coronary artery disease)   . Hyperlipidemia   . Hypertension   . Renal artery stenosis ([place])   . Thyroid disease     Family History: Family History  Problem Relation Age of Onset  . Colon cancer Mother   . Breast cancer Sister   . Colon cancer Brother     Social History   Socioeconomic History  . Marital status: Widowed    Spouse name: Not on file  . Number of children: Not on file  .  Years of education: Not on file  . Highest education level: Not on file  Occupational History  . Not on file  Tobacco Use  . Smoking status: Never Smoker  . Smokeless tobacco: Never Used  Vaping Use  . Vaping Use: Never used  Substance and Sexual Activity  . Alcohol use: No  . Drug use: No  . Sexual activity: Never  Other Topics Concern  . Not on file  Social History Narrative  . Not on file   Social Determinants of Health   Financial Resource Strain: Not on file  Food Insecurity: Not on file  Transportation Needs: Not on file  Physical Activity: Not on file  Stress: Not on file  Social Connections: Not on file  Intimate Partner Violence: Not on file      Review of  Systems  Constitutional: Negative for fatigue and fever.  HENT: Negative for congestion, mouth sores and postnasal drip.   Respiratory: Negative for cough.   Cardiovascular: Negative for chest pain.  Genitourinary: Positive for urgency. Negative for flank pain.  Psychiatric/Behavioral: Negative.     Vital Signs: BP 120/70   Pulse 67   Temp (!) 97.5 F (36.4 C)   Resp 16   Ht 5' (1.524 m)   Wt 141 lb 12.8 oz (64.3 kg)   SpO2 98%   BMI 27.69 kg/m    Physical Exam Constitutional:      Appearance: Normal appearance.  HENT:     Head: Normocephalic and atraumatic.  Eyes:     Extraocular Movements: Extraocular movements intact.     Pupils: Pupils are equal, round, and reactive to light.  Cardiovascular:     Rate and Rhythm: Normal rate and regular rhythm.  Pulmonary:     Breath sounds: Normal breath sounds.  Neurological:     General: No focal deficit present.     Mental Status: Maria Sanchez is alert. Mental status is at baseline.  Psychiatric:        Behavior: Behavior normal.        Assessment/Plan: 1. Acute cystitis without hematuria Finish Bactrim, might need additional therapy  - POCT Urinalysis Dipstick - CULTURE, URINE COMPREHENSIVE  2. COVID Recovering, will monitor  3. Multi-infarct dementia without behavioral disturbance ([place]) Maria Sanchez is stable, no hallucination   General Counseling: marcellina jonsson understanding of the findings of todays visit and agrees with plan of treatment. I have discussed any further diagnostic evaluation that may be needed or ordered today. We also reviewed Maria Sanchez medications today. Maria Sanchez has been encouraged to call the office with any questions or concerns that should arise related to todays visit.   Orders Placed This Encounter  Procedures  . CULTURE, URINE COMPREHENSIVE  . POCT Urinalysis Dipstick    No orders of the defined types were placed in this encounter.   Total time spent:30 Minutes Time spent includes review of chart,  medications, test results, and follow up plan with the patient.    Controlled Substance Database was reviewed by me.   Dr Lavera Guise Internal medicine

## 2020-06-21 NOTE — Progress Notes (Signed)
Awaiting c&s

## 2020-06-22 LAB — CULTURE, URINE COMPREHENSIVE

## 2020-06-25 ENCOUNTER — Other Ambulatory Visit: Payer: Self-pay

## 2020-06-25 ENCOUNTER — Telehealth: Payer: Self-pay

## 2020-06-25 MED ORDER — AMOXICILLIN-POT CLAVULANATE 875-125 MG PO TABS: 1.0000 | ORAL_TABLET | Freq: Two times a day (BID) | ORAL | 0 refills | Status: DC

## 2020-06-25 MED ORDER — NITROFURANTOIN MONOHYD MACRO 100 MG PO CAPS: ORAL_CAPSULE | ORAL | 0 refills | Status: DC

## 2020-06-25 NOTE — Telephone Encounter (Signed)
Spoke with pt  daughter that urine did showed Ecoli we send macrobid take MW and F take 1 tab po daily and we send augmentin 875 take bid for 10 days

## 2020-06-25 NOTE — Telephone Encounter (Signed)
Pt daughter advised urine did showed E

## 2020-06-26 DIAGNOSIS — R413 Other amnesia: Secondary | ICD-10-CM | POA: Diagnosis not present

## 2020-06-26 DIAGNOSIS — G309 Alzheimer's disease, unspecified: Secondary | ICD-10-CM | POA: Diagnosis not present

## 2020-06-26 DIAGNOSIS — R443 Hallucinations, unspecified: Secondary | ICD-10-CM | POA: Diagnosis not present

## 2020-06-26 DIAGNOSIS — F0280 Dementia in other diseases classified elsewhere without behavioral disturbance: Secondary | ICD-10-CM | POA: Diagnosis not present

## 2020-06-27 ENCOUNTER — Other Ambulatory Visit: Payer: Self-pay | Admitting: Nurse Practitioner

## 2020-06-27 DIAGNOSIS — I10 Essential (primary) hypertension: Secondary | ICD-10-CM

## 2020-06-28 ENCOUNTER — Other Ambulatory Visit: Payer: Self-pay | Admitting: Nurse Practitioner

## 2020-06-28 DIAGNOSIS — I10 Essential (primary) hypertension: Secondary | ICD-10-CM

## 2020-07-19 DIAGNOSIS — R443 Hallucinations, unspecified: Secondary | ICD-10-CM | POA: Diagnosis not present

## 2020-07-19 DIAGNOSIS — R413 Other amnesia: Secondary | ICD-10-CM | POA: Diagnosis not present

## 2020-07-19 DIAGNOSIS — G309 Alzheimer's disease, unspecified: Secondary | ICD-10-CM | POA: Diagnosis not present

## 2020-07-19 DIAGNOSIS — R440 Auditory hallucinations: Secondary | ICD-10-CM | POA: Diagnosis not present

## 2020-07-19 DIAGNOSIS — R262 Difficulty in walking, not elsewhere classified: Secondary | ICD-10-CM | POA: Diagnosis not present

## 2020-07-19 DIAGNOSIS — F0280 Dementia in other diseases classified elsewhere without behavioral disturbance: Secondary | ICD-10-CM | POA: Diagnosis not present

## 2020-07-30 ENCOUNTER — Other Ambulatory Visit: Payer: Self-pay | Admitting: Nurse Practitioner

## 2020-07-31 ENCOUNTER — Other Ambulatory Visit: Payer: Self-pay

## 2020-07-31 MED ORDER — HYDRALAZINE HCL 25 MG PO TABS: ORAL_TABLET | ORAL | 2 refills | Status: DC

## 2020-08-06 ENCOUNTER — Telehealth: Payer: Self-pay

## 2020-08-06 NOTE — Telephone Encounter (Signed)
Pt daughter called that she had UTI as per dr Humphrey Rolls advised to take twice a day today and we can she her tomorrow

## 2020-08-07 ENCOUNTER — Ambulatory Visit (INDEPENDENT_AMBULATORY_CARE_PROVIDER_SITE_OTHER): Payer: Medicare Other | Admitting: Internal Medicine

## 2020-08-07 ENCOUNTER — Encounter: Payer: Self-pay | Admitting: Internal Medicine

## 2020-08-07 ENCOUNTER — Other Ambulatory Visit: Payer: Self-pay

## 2020-08-07 VITALS — BP 150/60 | HR 51 | Temp 97.2°F | Resp 16 | Ht 60.0 in | Wt 140.6 lb

## 2020-08-07 DIAGNOSIS — R410 Disorientation, unspecified: Secondary | ICD-10-CM | POA: Diagnosis not present

## 2020-08-07 DIAGNOSIS — R4182 Altered mental status, unspecified: Secondary | ICD-10-CM | POA: Diagnosis not present

## 2020-08-07 DIAGNOSIS — I10 Essential (primary) hypertension: Secondary | ICD-10-CM | POA: Diagnosis not present

## 2020-08-07 DIAGNOSIS — R300 Dysuria: Secondary | ICD-10-CM

## 2020-08-07 DIAGNOSIS — R001 Bradycardia, unspecified: Secondary | ICD-10-CM

## 2020-08-07 DIAGNOSIS — R8290 Unspecified abnormal findings in urine: Secondary | ICD-10-CM | POA: Diagnosis not present

## 2020-08-07 LAB — POCT URINALYSIS DIPSTICK
Bilirubin, UA: NEGATIVE
Blood, UA: NEGATIVE
Glucose, UA: NEGATIVE
Ketones, UA: NEGATIVE
Nitrite, UA: NEGATIVE
Protein, UA: POSITIVE — AB
Spec Grav, UA: 1.015 (ref 1.010–1.025)
Urobilinogen, UA: 0.2 E.U./dL
pH, UA: 7 (ref 5.0–8.0)

## 2020-08-07 NOTE — Progress Notes (Signed)
[facility] [place], [place] [address]  Internal MEDICINE  Office Visit Note  Patient Name: Maria Sanchez  [id number]  [id number]  Date of Service: 08/07/2020  Chief Complaint  Patient presents with  . Acute Visit    Congested, sleeping a lot  . Urinary Tract Infection    Odor started few days ago  . Quality Metric Gaps    Covid booster, a1c, pt no longer diabetic     HPI Pt is seen for acute and sick visit. She has h/o chronic UTI, has been on suppressive therapy with Macrobid 100 mg 3 x per week. Daughter noticed pt has worsening in her mental status with decreased intake and malodourous urine. She does have baseline dementia. BP is elevated as well  Denies any fever or chills   Current Medication: Outpatient Encounter Medications as of 08/07/2020  Medication Sig  . allopurinol (ZYLOPRIM) 100 MG tablet TAKE 2 TABLETS BY MOUTH AT BEDTIME AS NEEDED FOR GOUT  . amLODipine (NORVASC) 5 MG tablet Take 1 tablet (5 mg total) by mouth daily.  Marland Kitchen amoxicillin-clavulanate (AUGMENTIN) 875-125 MG tablet Take 1 tablet by mouth 2 (two) times daily.  Marland Kitchen aspirin EC 81 MG tablet Take 81 mg daily by mouth.  . escitalopram (LEXAPRO) 10 MG tablet TAKE 1 TABLET BY MOUTH EVERY DAY WITH SUPPER  . estradiol (ESTRACE) 0.1 MG/GM vaginal cream Estrogen Cream Instruction Discard applicator Apply pea sized amount to tip of finger to urethra before bed. Wash hands well after application. Use Monday, Wednesday and Friday  . hydrALAZINE (APRESOLINE) 25 MG tablet TAKE 1 TABLET BY MOUTH 3 TIMES A DAY FOR BLOOD PRESSURE  . levothyroxine (SYNTHROID) 112 MCG tablet Take 1 tablet (112 mcg total) by mouth daily before breakfast.  . Multiple Vitamins-Minerals (PRESERVISION AREDS 2) CAPS Take by mouth 2 (two) times daily.  . nitrofurantoin, macrocrystal-monohydrate, (MACROBID) 100 MG capsule Take 1 tab po daily M,W and Friday  . pravastatin (PRAVACHOL) 10 MG tablet Take 1 tablet (10 mg total) by  mouth at bedtime.  . predniSONE (DELTASONE) 10 MG tablet Take 1 tab po 3 x day for 3 dTays then take 1 tab po 2 x a day for 3 days and then take 1 tab po daily for 3 days  . risperiDONE (RISPERDAL) 0.5 MG tablet Take 1.5 tab  po once a daily  . sulfamethoxazole-trimethoprim (BACTRIM) 400-80 MG tablet Take 1 tablet by mouth 2 (two) times daily.  . [DISCONTINUED] metoprolol succinate (TOPROL-XL) 25 MG 24 hr tablet Take 1 tablet (25 mg total) by mouth at bedtime.   No facility-administered encounter medications on file as of 08/07/2020.    Surgical History: Past Surgical History:  Procedure Laterality Date  . ABDOMINAL HYSTERECTOMY    . BREAST SURGERY     benign  . CHOLECYSTECTOMY    . CORONARY ARTERY BYPASS GRAFT    . exicision salivary gland    . MITRAL VALVE REPLACEMENT (MVR)/CORONARY ARTERY BYPASS GRAFTING (CABG)    . posterolateral fusion    . stent placed in kidney Right   . thumb arthroplasty  06/20/2005    Medical History: Past Medical History:  Diagnosis Date  . Arthritis   . CAD (coronary artery disease)   . Hyperlipidemia   . Hypertension   . Renal artery stenosis ([place])   . Thyroid disease     Family History: Family History  Problem Relation Age of Onset  . Colon cancer Mother   . Breast cancer Sister   .  Colon cancer Brother     Social History   Socioeconomic History  . Marital status: Widowed    Spouse name: Not on file  . Number of children: Not on file  . Years of education: Not on file  . Highest education level: Not on file  Occupational History  . Not on file  Tobacco Use  . Smoking status: Never Smoker  . Smokeless tobacco: Never Used  Vaping Use  . Vaping Use: Never used  Substance and Sexual Activity  . Alcohol use: No  . Drug use: No  . Sexual activity: Never  Other Topics Concern  . Not on file  Social History Narrative  . Not on file   Social Determinants of Health   Financial Resource Strain: Not on file  Food Insecurity: Not  on file  Transportation Needs: Not on file  Physical Activity: Not on file  Stress: Not on file  Social Connections: Not on file  Intimate Partner Violence: Not on file      Review of Systems  Constitutional: Negative for fatigue and fever.  HENT: Negative for congestion, mouth sores and postnasal drip.   Respiratory: Negative for cough.   Cardiovascular: Negative for chest pain.  Genitourinary: Positive for dysuria and urgency. Negative for flank pain.  Psychiatric/Behavioral: Positive for decreased concentration.    Vital Signs: BP (!) 172/66   Pulse (!) 51   Temp (!) 97.2 F (36.2 C)   Resp 16   Ht 5' (1.524 m)   Wt 140 lb 9.6 oz (63.8 kg)   SpO2 97%   BMI 27.46 kg/m    Physical Exam HENT:     Head: Normocephalic and atraumatic.  Eyes:     Extraocular Movements: Extraocular movements intact.     Pupils: Pupils are equal, round, and reactive to light.  Cardiovascular:     Pulses: Normal pulses.  Neurological:     Mental Status: She is alert. Mental status is at baseline.  Psychiatric:        Mood and Affect: Mood normal.    Assessment/Plan: 1. Malodorous urine Pt will increase Macrobid 100 mg po bid until C/S is available  - POCT Urinalysis Dipstick - CULTURE, URINE COMPREHENSIVE - Basic Metabolic Panel (BMET) - CBC With Differential  2. Altered mental status, unspecified altered mental status type Will need to het BUN/CR  - Basic Metabolic Panel (BMET) - CBC With Differential  3. Benign hypertension BP is elevated here in the office, repeat is still high, no home readings, will keep a BP log at home  - Basic Metabolic Panel (BMET) - CBC With Differential  4. Bradycardia Noticed to be bradycardiac, will hold Metoprolol for now, night need to increase other antihypertensives once home readings  are available   General Counseling: mitali shenefield understanding of the findings of todays visit and agrees with plan of treatment. I have discussed any  further diagnostic evaluation that may be needed or ordered today. We also reviewed her medications today. she has been encouraged to call the office with any questions or concerns that should arise related to todays visit.    Orders Placed This Encounter  Procedures  . CULTURE, URINE COMPREHENSIVE  . Basic Metabolic Panel (BMET)  . CBC With Differential  . POCT Urinalysis Dipstick    No orders of the defined types were placed in this encounter.   Total time spent:30 Minutes Time spent includes review of chart, medications, test results, and follow up plan with the patient.  [place] Controlled Substance Database was reviewed by me.   Dr Lavera Guise Internal medicine

## 2020-08-08 ENCOUNTER — Telehealth: Payer: Self-pay

## 2020-08-08 LAB — BASIC METABOLIC PANEL
BUN/Creatinine Ratio: 20 (ref 12–28)
BUN: 25 mg/dL (ref 8–27)
CO2: 24 mmol/L (ref 20–29)
Calcium: 9.1 mg/dL (ref 8.7–10.3)
Chloride: 100 mmol/L (ref 96–106)
Creatinine, Ser: 1.22 mg/dL — ABNORMAL HIGH (ref 0.57–1.00)
Glucose: 117 mg/dL — ABNORMAL HIGH (ref 65–99)
Potassium: 4.4 mmol/L (ref 3.5–5.2)
Sodium: 141 mmol/L (ref 134–144)
eGFR: 43 mL/min/{1.73_m2} — ABNORMAL LOW (ref >59)

## 2020-08-08 LAB — CBC WITH DIFFERENTIAL
Basophils Absolute: 0 10*3/uL (ref 0.0–0.2)
Basos: 0 %
EOS (ABSOLUTE): 0.1 10*3/uL (ref 0.0–0.4)
Eos: 1 %
Hematocrit: 32.4 % — ABNORMAL LOW (ref 34.0–46.6)
Hemoglobin: 10.3 g/dL — ABNORMAL LOW (ref 11.1–15.9)
Immature Grans (Abs): 0 10*3/uL (ref 0.0–0.1)
Immature Granulocytes: 0 %
Lymphocytes Absolute: 1.6 10*3/uL (ref 0.7–3.1)
Lymphs: 21 %
MCH: 26.7 pg (ref 26.6–33.0)
MCHC: 31.8 g/dL (ref 31.5–35.7)
MCV: 84 fL (ref 79–97)
Monocytes Absolute: 0.6 10*3/uL (ref 0.1–0.9)
Monocytes: 8 %
Neutrophils Absolute: 5.2 10*3/uL (ref 1.4–7.0)
Neutrophils: 70 %
RBC: 3.86 x10E6/uL (ref 3.77–5.28)
RDW: 14.2 % (ref 11.7–15.4)
WBC: 7.4 10*3/uL (ref 3.4–10.8)

## 2020-08-08 NOTE — Telephone Encounter (Signed)
Daughter cathy adams called and advised that DFK stopped metoprolol and asked for her to keep track of her Bp's.   12 pm 138/81 4 pm 168/78....fire dept was at house daughter asked to take (machine) 415 pm 220/72 wrist machine 192/65 wrist machine told to put arm across chest. Spoke to [facility] and she advised for pt to take a extra hydralazine 25 mg and let us know how Bp's are tomorrow

## 2020-08-09 ENCOUNTER — Telehealth: Payer: Self-pay

## 2020-08-09 MED ORDER — NITROFURANTOIN MONOHYD MACRO 100 MG PO CAPS: ORAL_CAPSULE | ORAL | 0 refills | Status: DC

## 2020-08-09 NOTE — Progress Notes (Signed)
Reviewed

## 2020-08-09 NOTE — Telephone Encounter (Signed)
Pt called that her bp 166/68 and HR 67 as per dr advised continue hydralazine as pres  If Bp is high she can take Hydralazine 1 and 1/2 tab in morning,1 tab in afternoon and 1 and 1/2 at night and send macrobid

## 2020-08-10 LAB — CULTURE, URINE COMPREHENSIVE

## 2020-08-13 ENCOUNTER — Encounter: Payer: Self-pay | Admitting: Internal Medicine

## 2020-08-13 ENCOUNTER — Other Ambulatory Visit: Payer: Self-pay | Admitting: Internal Medicine

## 2020-08-13 MED ORDER — AMOXICILLIN-POT CLAVULANATE 875-125 MG PO TABS: 1.0000 | ORAL_TABLET | Freq: Two times a day (BID) | ORAL | 0 refills | Status: DC

## 2020-08-13 NOTE — Telephone Encounter (Signed)
see

## 2020-08-14 ENCOUNTER — Telehealth: Payer: Self-pay

## 2020-08-14 ENCOUNTER — Other Ambulatory Visit: Payer: Self-pay | Admitting: Internal Medicine

## 2020-08-14 DIAGNOSIS — B9620 Unspecified Escherichia coli [E. coli] as the cause of diseases classified elsewhere: Secondary | ICD-10-CM

## 2020-08-14 DIAGNOSIS — Z1635 Resistance to multiple antimicrobial drugs: Secondary | ICD-10-CM

## 2020-08-14 NOTE — Progress Notes (Signed)
a 

## 2020-08-14 NOTE — Telephone Encounter (Signed)
Faxed urgent referral to [facility] at [phone].

## 2020-08-15 DIAGNOSIS — Z2239 Carrier of other specified bacterial diseases: Secondary | ICD-10-CM | POA: Diagnosis not present

## 2020-08-15 DIAGNOSIS — N390 Urinary tract infection, site not specified: Secondary | ICD-10-CM | POA: Diagnosis not present

## 2020-09-27 ENCOUNTER — Other Ambulatory Visit: Payer: Self-pay | Admitting: Nurse Practitioner

## 2020-09-27 ENCOUNTER — Other Ambulatory Visit: Payer: Self-pay

## 2020-09-27 DIAGNOSIS — I10 Essential (primary) hypertension: Secondary | ICD-10-CM

## 2020-09-27 MED ORDER — AMLODIPINE BESYLATE 5 MG PO TABS: 5.0000 mg | ORAL_TABLET | Freq: Every day | ORAL | 1 refills | Status: DC

## 2020-10-02 ENCOUNTER — Telehealth: Payer: Self-pay | Admitting: Internal Medicine

## 2020-10-02 NOTE — Chronic Care Management (AMB) (Signed)
  Chronic Care Management   Outreach Note  10/02/2020 Name: Maria Sanchez MRN: [id number] DOB: [birth year]  Referred by: Lavera Guise, MD Reason for referral : No chief complaint on file.   An unsuccessful telephone outreach was attempted today. The patient was referred to the pharmacist for assistance with care management and care coordination.   Follow Up Plan:   Tatjana Dellinger Upstream Scheduler

## 2020-10-09 ENCOUNTER — Telehealth: Payer: Self-pay | Admitting: Internal Medicine

## 2020-10-09 NOTE — Chronic Care Management (AMB) (Signed)
  Chronic Care Management   Note  10/09/2020 Name: Maria Sanchez MRN: [id number] DOB: [birth year]  Maria Sanchez is a 87 y.o. year old female who is a primary care patient of Lavera Guise, MD. I reached out to [name] by phone today in response to a referral sent by [name]'s PCP, Lavera Guise, MD.   [name] was given information about Chronic Care Management services today including:  CCM service includes personalized support from designated clinical staff supervised by her physician, including individualized plan of care and coordination with other care providers 24/7 contact phone numbers for assistance for urgent and routine care needs. Service will only be billed when office clinical staff spend 20 minutes or more in a month to coordinate care. Only one practitioner may furnish and bill the service in a calendar month. The patient may stop CCM services at any time (effective at the end of the month) by phone call to the office staff.   [name] verbally agreed to assistance and services provided by embedded care coordination/care management team today.  Follow up plan:   [name]

## 2020-10-18 DIAGNOSIS — H6123 Impacted cerumen, bilateral: Secondary | ICD-10-CM | POA: Diagnosis not present

## 2020-10-18 DIAGNOSIS — H903 Sensorineural hearing loss, bilateral: Secondary | ICD-10-CM | POA: Diagnosis not present

## 2020-12-25 ENCOUNTER — Other Ambulatory Visit: Payer: Self-pay | Admitting: Nurse Practitioner

## 2020-12-25 ENCOUNTER — Other Ambulatory Visit: Payer: Self-pay

## 2020-12-25 DIAGNOSIS — E039 Hypothyroidism, unspecified: Secondary | ICD-10-CM

## 2020-12-25 MED ORDER — ESCITALOPRAM OXALATE 10 MG PO TABS: ORAL_TABLET | ORAL | 0 refills | Status: DC

## 2020-12-26 ENCOUNTER — Other Ambulatory Visit: Payer: Self-pay | Admitting: Nurse Practitioner

## 2020-12-26 DIAGNOSIS — E039 Hypothyroidism, unspecified: Secondary | ICD-10-CM

## 2020-12-27 ENCOUNTER — Other Ambulatory Visit: Payer: Self-pay

## 2020-12-27 DIAGNOSIS — E039 Hypothyroidism, unspecified: Secondary | ICD-10-CM

## 2020-12-27 MED ORDER — LEVOTHYROXINE SODIUM 112 MCG PO TABS: 112.0000 ug | ORAL_TABLET | Freq: Every day | ORAL | 0 refills | Status: DC

## 2020-12-28 ENCOUNTER — Encounter: Payer: Self-pay | Admitting: Physician Assistant

## 2020-12-28 ENCOUNTER — Ambulatory Visit (INDEPENDENT_AMBULATORY_CARE_PROVIDER_SITE_OTHER): Payer: Medicare Other | Admitting: Physician Assistant

## 2020-12-28 ENCOUNTER — Other Ambulatory Visit: Payer: Self-pay

## 2020-12-28 VITALS — BP 145/82 | HR 78 | Ht 60.0 in | Wt 125.2 lb

## 2020-12-28 DIAGNOSIS — N390 Urinary tract infection, site not specified: Secondary | ICD-10-CM | POA: Diagnosis not present

## 2020-12-28 NOTE — Progress Notes (Signed)
12/28/2020 5:40 PM   [name] [birth year] [id number]  CC: Chief Complaint  Patient presents with   Recurrent UTI   HPI: Maria Sanchez is a 87 y.o. female with PMH dementia, renal artery stenosis with atrophic right kidney, and multidrug-resistant recurrent UTI who presents today for evaluation of possible UTI.  She is accompanied today by her daughter, who contributes to HPI.  Today her daughter reports an approximate 1 week history of malodorous urine and increased confusion and wandering consistent with past UTIs.  Patient denies dysuria, fever, chills, nausea, or vomiting today.  Patient was previously on antibiotic prophylaxis, however unfortunately she developed antibiotic resistance to most oral agents.  She was seen by Dr. Ola Spurr on 08/15/2020 and treated with 3 doses of fosfomycin at this time.  Daughter reports her UTIs subsequently resolved.  In-office UA today positive for trace ketones, 2+ protein, and trace leukocyte esterase; urine microscopy with 6-10 WBCs/HPF and many bacteria.   PMH: Past Medical History:  Diagnosis Date   Arthritis    CAD (coronary artery disease)    Hyperlipidemia    Hypertension    Renal artery stenosis (HCC)    Thyroid disease     Surgical History: Past Surgical History:  Procedure Laterality Date   ABDOMINAL HYSTERECTOMY     BREAST SURGERY     benign   CHOLECYSTECTOMY     CORONARY ARTERY BYPASS GRAFT     exicision salivary gland     MITRAL VALVE REPLACEMENT (MVR)/CORONARY ARTERY BYPASS GRAFTING (CABG)     posterolateral fusion     stent placed in kidney Right    thumb arthroplasty  06/20/2005    Home Medications:  Allergies as of 12/28/2020       Reactions   Morphine And Related Other (See Comments)   "makes me crazy"   Requip [ropinirole Hcl]         Medication List        Accurate as of December 28, 2020  5:40 PM. If you have any questions, ask your nurse or doctor.          STOP taking these  medications    amoxicillin-clavulanate 875-125 MG tablet Commonly known as: AUGMENTIN Stopped by: Debroah Loop, PA-C   nitrofurantoin (macrocrystal-monohydrate) 100 MG capsule Commonly known as: MACROBID Stopped by: Debroah Loop, PA-C   PreserVision AREDS 2 Caps Stopped by: Debroah Loop, PA-C   sulfamethoxazole-trimethoprim 400-80 MG tablet Commonly known as: BACTRIM Stopped by: Debroah Loop, PA-C       TAKE these medications    allopurinol 100 MG tablet Commonly known as: ZYLOPRIM TAKE 2 TABLETS BY MOUTH AT BEDTIME AS NEEDED FOR GOUT   amLODipine 5 MG tablet Commonly known as: NORVASC Take 1 tablet (5 mg total) by mouth daily.   aspirin EC 81 MG tablet Take 81 mg daily by mouth.   escitalopram 10 MG tablet Commonly known as: LEXAPRO TAKE 1 TABLET BY MOUTH EVERY DAY WITH SUPPER   estradiol 0.1 MG/GM vaginal cream Commonly known as: ESTRACE Estrogen Cream Instruction Discard applicator Apply pea sized amount to tip of finger to urethra before bed. Wash hands well after application. Use Monday, Wednesday and Friday   hydrALAZINE 25 MG tablet Commonly known as: APRESOLINE TAKE 1 TABLET BY MOUTH 3 TIMES A DAY FOR BLOOD PRESSURE   levothyroxine 112 MCG tablet Commonly known as: SYNTHROID Take 1 tablet (112 mcg total) by mouth daily before breakfast.   pravastatin 10 MG tablet Commonly known as: PRAVACHOL  Take 1 tablet (10 mg total) by mouth at bedtime.   predniSONE 10 MG tablet Commonly known as: DELTASONE Take 1 tab po 3 x day for 3 dTays then take 1 tab po 2 x a day for 3 days and then take 1 tab po daily for 3 days   risperiDONE 0.5 MG tablet Commonly known as: RISPERDAL Take 1.5 tab  po once a daily        Allergies:  Allergies  Allergen Reactions   Morphine And Related Other (See Comments)    "makes me crazy"   Requip [Ropinirole Hcl]     Family History: Family History  Problem Relation Age of Onset    Colon cancer Mother    Breast cancer Sister    Colon cancer Brother     Social History:   reports that she has never smoked. She has never used smokeless tobacco. She reports that she does not drink alcohol and does not use drugs.  Physical Exam: BP (!) 145/82   Pulse 78   Ht 5' (1.524 m)   Wt 125 lb 3.2 oz (56.8 kg)   BMI 24.45 kg/m   Constitutional:  Alert, no acute distress, nontoxic appearing HEENT: [place], AT Cardiovascular: No clubbing, cyanosis, or edema Respiratory: Normal respiratory effort, no increased work of breathing Skin: No rashes, bruises or suspicious lesions Neurologic: Grossly intact, no focal deficits, moving all 4 extremities Psychiatric: Normal mood and affect  Laboratory Data: Results for orders placed or performed in visit on 12/28/20  Microscopic Examination   Urine  Result Value Ref Range   WBC, UA 6-10 (A) 0 - 5 /hpf   RBC 0-2 0 - 2 /hpf   Epithelial Cells (non renal) 0-10 0 - 10 /hpf   Renal Epithel, UA 0-10 (A) None seen /hpf   Bacteria, UA Many (A) None seen/Few  Urinalysis, Complete  Result Value Ref Range   Specific Gravity, UA 1.020 1.005 - 1.030   pH, UA 7.0 5.0 - 7.5   Color, UA Yellow Yellow   Appearance Ur Cloudy (A) Clear   Leukocytes,UA Trace (A) Negative   Protein,UA 2+ (A) Negative/Trace   Glucose, UA Negative Negative   Ketones, UA Trace (A) Negative   RBC, UA Negative Negative   Bilirubin, UA Negative Negative   Urobilinogen, Ur 1.0 0.2 - 1.0 mg/dL   Nitrite, UA Negative Negative   Microscopic Examination See below:    Assessment & Plan:   1. Recurrent UTI (urinary tract infection) UA today notable for mild pyuria and significant bacteriuria, though she denies irritative symptoms today and reports only malodorous urine and increased confusion.  I suspect this may represent asymptomatic bacteriuria and not true urinary infection, which I discussed with the patient's daughter.  Regardless, she is well-appearing in clinic today  and her vitals are stable.  I will send her urine for culture today and reach out with results.  At that point, we will treat as indicated if there continues to be concern for urinary infection. - Urinalysis, Complete - CULTURE, URINE COMPREHENSIVE  Return if symptoms worsen or fail to improve.  Debroah Loop, PA-C  [facility] [address], [place], [place] [address] [phone]

## 2021-01-01 ENCOUNTER — Telehealth: Payer: Self-pay | Admitting: Pharmacist

## 2021-01-01 LAB — URINALYSIS, COMPLETE
Bilirubin, UA: NEGATIVE
Glucose, UA: NEGATIVE
Nitrite, UA: NEGATIVE
RBC, UA: NEGATIVE
Specific Gravity, UA: 1.02 (ref 1.005–1.030)
Urobilinogen, Ur: 1 mg/dL (ref 0.2–1.0)
pH, UA: 7 (ref 5.0–7.5)

## 2021-01-01 LAB — MICROSCOPIC EXAMINATION

## 2021-01-01 NOTE — Progress Notes (Addendum)
Chronic Care Management Pharmacy Assistant   Name: Maria Sanchez  MRN: [id number] DOB: 12-23-1930  Maria Sanchez is an 87 y.o. year old female who presents for his initial CCM visit with the clinical pharmacist.  Reason for Encounter: Chart Prep    Conditions to be addressed/monitored: HTN, PVD, CAD, Type 2 DM, HLD, Vitamin D Deficiency, Anxiety, Depression, Insomnia.   Primary concerns for visit include:  HTN, Type 2 DM.   Recent office visits:  08/07/20 Dr. Humphrey Rolls For acute visit. STOPPED Metoprolol 25 mg. Per note:Pt will increase Macrobid 100 mg po bid until C/S is available   Recent consult visits:  12/28/20 Urology Debroah Loop, PA-C. For recurrent UTI STOPPED Amoxicillin, Multiple Vitamins, Nitrofurantion, and Sulfamethoxazole.  10/18/20 Otolaryngology Margaretha Sheffield For sensorineural hearing. No information given.  08/15/20 Neurology Angelena Form, MD For recurrent UTI. Per note: Please stop the augmentin an nitrofurantoin Start fosamax 3 gm every other day for 3 doses. Restart the vaginal estrogen cream. Increase your fluid intake.  07/19/20 Neurology Melrose Nakayama, Liliane Shi, MD For loss of memory. No medication history.   Hospital visits:  None in previous 6 months  Medication History: Pravastatin 10 mg 09/27/20 30 DS.   Medications: Outpatient Encounter Medications as of 01/01/2021  Medication Sig   allopurinol (ZYLOPRIM) 100 MG tablet TAKE 2 TABLETS BY MOUTH AT BEDTIME AS NEEDED FOR GOUT   amLODipine (NORVASC) 5 MG tablet Take 1 tablet (5 mg total) by mouth daily.   aspirin EC 81 MG tablet Take 81 mg daily by mouth.   escitalopram (LEXAPRO) 10 MG tablet TAKE 1 TABLET BY MOUTH EVERY DAY WITH SUPPER   estradiol (ESTRACE) 0.1 MG/GM vaginal cream Estrogen Cream Instruction Discard applicator Apply pea sized amount to tip of finger to urethra before bed. Wash hands well after application. Use Monday, Wednesday and Friday   hydrALAZINE (APRESOLINE) 25 MG  tablet TAKE 1 TABLET BY MOUTH 3 TIMES A DAY FOR BLOOD PRESSURE   levothyroxine (SYNTHROID) 112 MCG tablet Take 1 tablet (112 mcg total) by mouth daily before breakfast.   pravastatin (PRAVACHOL) 10 MG tablet Take 1 tablet (10 mg total) by mouth at bedtime.   predniSONE (DELTASONE) 10 MG tablet Take 1 tab po 3 x day for 3 dTays then take 1 tab po 2 x a day for 3 days and then take 1 tab po daily for 3 days   risperiDONE (RISPERDAL) 0.5 MG tablet Take 1.5 tab  po once a daily   No facility-administered encounter medications on file as of 01/01/2021.    Have you seen any other providers since your last visit?  Any changes in your medications or health?   Any side effects from any medications?   Do you have an symptoms or problems not managed by your medications?   Any concerns about your health right now?   Has your provider asked that you check blood pressure, blood sugar, or follow special diet at home?   Do you get any type of exercise on a regular basis?   Can you think of a goal you would like to reach for your health?   Do you have any problems getting your medications?   Is there anything that you would like to discuss during the appointment?   Please bring medications and supplements to appointment,patient reminded of her OTP appointment on 01/02/21 at 300 pm and requested that I cancel her moms appointment and refused to answer any of my questions.  Follow-Up:Pharmacist  Review  Maria Sanchez, [place] Clinical Pharmacist Assistant [phone]

## 2021-01-02 ENCOUNTER — Telehealth: Payer: Medicare Other

## 2021-01-02 NOTE — Progress Notes (Deleted)
Chronic Care Management Pharmacy Note  01/02/2021 Name:  [name] MRN:  [id number] DOB:  Jan 30, 1931  Summary: ***  Recommendations/Changes made from today's visit: ***  Plan: ***   Subjective: Maria Sanchez is an 87 y.o. year old female who is a primary patient of Maria Sanchez, Timoteo Gaul, MD.  The CCM team was consulted for assistance with disease management and care coordination needs.    Engaged with patient by telephone for initial visit in response to provider referral for pharmacy case management and/or care coordination services.   Consent to Services:  The patient was given the following information about Chronic Care Management services today, agreed to services, and gave verbal consent: 1. CCM service includes personalized support from designated clinical staff supervised by the primary care provider, including individualized plan of care and coordination with other care providers 2. 24/7 contact phone numbers for assistance for urgent and routine care needs. 3. Service will only be billed when office clinical staff spend 20 minutes or more in a month to coordinate care. 4. Only one practitioner may furnish and bill the service in a calendar month. 5.The patient may stop CCM services at any time (effective at the end of the month) by phone call to the office staff. 6. The patient will be responsible for cost sharing (co-pay) of up to 20% of the service fee (after annual deductible is met). Patient agreed to services and consent obtained.  Patient Care Team: Lavera Guise, MD as PCP - General (Internal Medicine) Edythe Clarity, [facility] as Pharmacist (Pharmacist)  Recent office visits:  08/07/20 Dr. Humphrey Sanchez For acute visit. STOPPED Metoprolol 25 mg. Per note:Pt will increase Macrobid 100 mg po bid until C/S is available    Recent consult visits:  12/28/20 Urology Debroah Loop, PA-C. For recurrent UTI STOPPED Amoxicillin, Multiple Vitamins, Nitrofurantion, and Sulfamethoxazole.   10/18/20 Otolaryngology Margaretha Sheffield For sensorineural hearing. No information given.  08/15/20 Neurology Angelena Form, MD For recurrent UTI. Per note: Please stop the augmentin an nitrofurantoin Start fosamax 3 gm every other day for 3 doses. Restart the vaginal estrogen cream. Increase your fluid intake.  07/19/20 Neurology Melrose Nakayama, Liliane Shi, MD For loss of memory. No medication history.    Hospital visits:  None in previous 6 months   Medication History: Pravastatin 10 mg 09/27/20 30 DS  Objective:  Lab Results  Component Value Date   CREATININE 1.22 (H) 08/07/2020   BUN 25 08/07/2020   GFRNONAA 28 (L) 01/24/2020   GFRAA 32 (L) 01/24/2020   NA 141 08/07/2020   K 4.4 08/07/2020   CALCIUM 9.1 08/07/2020   CO2 24 08/07/2020   GLUCOSE 117 (H) 08/07/2020    Lab Results  Component Value Date/Time   HGBA1C 5.2 11/10/2019 09:31 AM   HGBA1C 5.5 07/21/2017 09:57 AM    Last diabetic Eye exam: No results found for: HMDIABEYEEXA  Last diabetic Foot exam: No results found for: HMDIABFOOTEX   Lab Results  Component Value Date   CHOL 160 01/24/2020   HDL 54 01/24/2020   LDLCALC 87 01/24/2020   TRIG 107 01/24/2020    Hepatic Function Latest Ref Rng & Units 01/24/2020 01/31/2019  Total Protein 6.0 - 8.5 g/dL 7.0 6.8  Albumin 3.6 - 4.6 g/dL 4.1 4.1  AST 0 - 40 IU/L 25 25  ALT 0 - 32 IU/L 11 12  Alk Phosphatase 44 - 121 IU/L 64 77  Total Bilirubin 0.0 - 1.2 mg/dL 0.3 0.3  Lab Results  Component Value Date/Time   TSH 2.320 01/24/2020 03:50 PM   TSH 2.610 05/18/2019 01:27 PM   FREET4 1.35 01/24/2020 03:50 PM   FREET4 1.29 05/18/2019 01:27 PM    CBC Latest Ref Rng & Units 08/07/2020 01/24/2020 01/31/2019  WBC 3.4 - 10.8 x10E3/uL 7.4 6.9 6.3  Hemoglobin 11.1 - 15.9 g/dL 10.3(L) 11.2 11.9  Hematocrit 34.0 - 46.6 % 32.4(L) 33.0(L) 36.1  Platelets 150 - 450 x10E3/uL - 153 148(L)    Lab Results  Component Value Date/Time   VD25OH 34.4 01/24/2020 03:50 PM    VD25OH 20.0 (L) 01/31/2019 01:32 PM    Clinical ASCVD: {YES/NO:21197} The ASCVD Risk score Mikey Bussing DC Jr., et al., 2013) failed to calculate for the following reasons:   The 2013 ASCVD risk score is only valid for ages 93 to 16   The patient has a prior MI or stroke diagnosis    Depression screen [facility] 2/9 02/10/2020 01/19/2020 01/05/2020  Decreased Interest 0 0 0  Down, Depressed, Hopeless 0 0 0  PHQ - 2 Score 0 0 0  Altered sleeping - - -  Tired, decreased energy - - -  Change in appetite - - -  Feeling bad or failure about yourself  - - -  Trouble concentrating - - -  Moving slowly or fidgety/restless - - -  Suicidal thoughts - - -  PHQ-9 Score - - -  Difficult doing work/chores - - -     ***Other: (CHADS2VASc if Afib, MMRC or CAT for COPD, ACT, DEXA)  Social History   Tobacco Use  Smoking Status Never  Smokeless Tobacco Never   BP Readings from Last 3 Encounters:  12/28/20 (!) 145/82  08/07/20 (!) 150/60  06/19/20 120/70   Pulse Readings from Last 3 Encounters:  12/28/20 78  08/07/20 (!) 51  06/19/20 67   Wt Readings from Last 3 Encounters:  12/28/20 125 lb 3.2 oz (56.8 kg)  08/07/20 140 lb 9.6 oz (63.8 kg)  06/19/20 141 lb 12.8 oz (64.3 kg)   BMI Readings from Last 3 Encounters:  12/28/20 24.45 kg/m  08/07/20 27.46 kg/m  06/19/20 27.69 kg/m    Assessment/Interventions: Review of patient past medical history, allergies, medications, health status, including review of consultants reports, laboratory and other test data, was performed as part of comprehensive evaluation and provision of chronic care management services.   SDOH:  (Social Determinants of Health) assessments and interventions performed: {yes/no:20286}  SDOH Screenings   Alcohol Screen: Not on file  Depression (PHQ2-9): Low Risk    PHQ-2 Score: 0  Financial Resource Strain: Not on file  Food Insecurity: Not on file  Housing: Not on file  Physical Activity: Not on file  Social Connections:  Not on file  Stress: Not on file  Tobacco Use: Low Risk    Smoking Tobacco Use: Never   Smokeless Tobacco Use: Never  Transportation Needs: Not on file    Chical  Allergies  Allergen Reactions   Morphine And Related Other (See Comments)    "makes me crazy"   Requip [Ropinirole Hcl]     Medications Reviewed Today     Reviewed by Kyra Manges, CMA (Certified Medical Assistant) on 12/28/20 at [place] [facility] Status: <None>   Medication Order Taking? Sig Documenting Provider Last Dose Status Informant  allopurinol (ZYLOPRIM) 100 MG tablet [id number] Yes TAKE 2 TABLETS BY MOUTH AT BEDTIME AS NEEDED FOR GOUT Ronnell Freshwater, NP Taking Active  amLODipine (NORVASC) 5 MG tablet [id number] Yes Take 1 tablet (5 mg total) by mouth daily. Lavera Guise, MD Taking Active   aspirin EC 81 MG tablet [id number] Yes Take 81 mg daily by mouth. [provider] Taking Active   escitalopram (LEXAPRO) 10 MG tablet [id number] Yes TAKE 1 TABLET BY MOUTH EVERY DAY WITH SUPPER Lavera Guise, MD Taking Active   estradiol (ESTRACE) 0.1 MG/GM vaginal cream [id number] Yes Estrogen Cream Instruction Discard applicator Apply pea sized amount to tip of finger to urethra before bed. Wash hands well after application. Use Monday, Wednesday and Friday Billey Co, MD Taking Active   hydrALAZINE (APRESOLINE) 25 MG tablet [id number] Yes TAKE 1 TABLET BY MOUTH 3 TIMES A DAY FOR BLOOD PRESSURE Lavera Guise, MD Taking Active   levothyroxine (SYNTHROID) 112 MCG tablet [id number] Yes Take 1 tablet (112 mcg total) by mouth daily before breakfast. Lavera Guise, MD Taking Active   pravastatin (PRAVACHOL) 10 MG tablet [id number] Yes Take 1 tablet (10 mg total) by mouth at bedtime. Ronnell Freshwater, NP Taking Active   predniSONE (DELTASONE) 10 MG tablet [id number] Yes Take 1 tab po 3 x day for 3 dTays then take 1 tab po 2 x a day for 3 days and then take 1 tab po daily for 3 days Lavera Guise, MD Taking Active    risperiDONE (RISPERDAL) 0.5 MG tablet [id number] Yes Take 1.5 tab  po once a daily [provider] Taking Active             Patient Active Problem List   Diagnosis Date Noted   Loss of memory 03/01/2020   Cerebrovascular small vessel disease 02/19/2020   Abnormal weight loss 02/04/2020   Needs flu shot 02/04/2020   Dizziness of unknown cause 01/05/2020   Disorientation 01/05/2020   Depression, major, single episode, in partial remission ([place]) 11/20/2019   Anxiety disorder 05/25/2019   Insomnia due to medical condition 05/25/2019   Chronic gout without tophus 01/23/2019   LVH (left ventricular hypertrophy) due to hypertensive disease, without heart failure 12/14/2018   CAD (coronary artery disease) 09/02/2018   Recurrent UTI (urinary tract infection) 07/14/2018   Leukocytosis 07/14/2018   Dysuria 07/14/2018   Bilateral carotid artery stenosis 01/21/2018   Encounter for general adult medical examination with abnormal findings 01/20/2018   B12 deficiency 09/03/2017   Stage 3 chronic kidney disease ([place]) 04/22/2017   Vitamin B12 deficiency anemia, unspecified 04/22/2017   Dementia in other diseases classified elsewhere with behavioral disturbance ([place]) 04/22/2017   Auditory hallucination 04/22/2017   Essential hypertension 04/22/2017   Restless leg syndrome 04/22/2017   Type 2 diabetes mellitus with hyperglycemia, without long-term current use of insulin ([place]) 63/84/5364   Nonalcoholic steatohepatitis ([facility]) 04/22/2017   Allergic rhinitis 04/22/2017   Peripheral vascular disease ([place]) 04/22/2017   Vitamin D deficiency 04/22/2017   Lumbago with sciatica, right side 04/22/2017   Occlusion and stenosis of bilateral carotid arteries 04/22/2017   Chronic ischemic heart disease 04/22/2017   Acquired hypothyroidism 04/22/2017   Primary generalized (osteo)arthritis 04/22/2017   Atherosclerotic heart disease of native coronary artery without angina pectoris 04/22/2017    Renovascular hypertension 04/22/2017   Chronic pulmonary hypertension ([place]) 12/14/2014   Moderate tricuspid insufficiency 12/14/2014   Mixed hyperlipidemia 11/16/2013    Immunization History  Administered Date(s) Administered   Fluad Quad(high Dose 65+) 01/27/2019, 01/19/2020   Influenza Inj Mdck Quad Pf 01/08/2018, 02/10/2020   Influenza,inj,quad, With Preservative 01/31/2019   Moderna  Sars-Covid-2 Vaccination 06/01/2019, 06/29/2019    Conditions to be addressed/monitored:  HTN, PVD, CAD, Type II DM, Hypothyroidism, Dementia, RLS, HLD, Depression  There are no care plans that you recently modified to display for this patient.    Medication Assistance: {MEDASSISTANCEINFO:25044}  Compliance/Adherence/Medication fill history: Care Gaps: Needs A1c  Star-Rating Drugs: Pravastatin 10 mg 09/27/20 30 DS  Patient's preferred pharmacy is:  [facility], [place] - [address] [place] [address] Phone: [phone] Fax: [phone]  Uses pill box? {Yes or If no, why not?:20788} Pt endorses ***% compliance  We discussed: {Pharmacy options:24294} Patient decided to: {US Pharmacy Plan:23885}  Care Plan and Follow Up Patient Decision:  {FOLLOWUP:24991}  Plan: {CM FOLLOW UP IPPG:98421}  ***  Current Barriers:  {pharmacybarriers:24917}  Pharmacist Clinical Goal(s):  Patient will {PHARMACYGOALCHOICES:24921} through collaboration with PharmD and provider.   Interventions: 1:1 collaboration with Lavera Guise, MD regarding development and update of comprehensive plan of care as evidenced by provider attestation and co-signature Inter-disciplinary care team collaboration (see longitudinal plan of care) Comprehensive medication review performed; medication list updated in electronic medical record  Hypertension (BP goal {CHL HP UPSTREAM Pharmacist BP ranges:[phone]}) -{US controlled/uncontrolled:25276} -Current treatment: *** -Medications previously  tried: ***  -Current home readings: *** -Current dietary habits: *** -Current exercise habits: *** -{ACTIONS;DENIES/REPORTS:21021675::"Denies"} hypotensive/hypertensive symptoms -Educated on {CCM BP Counseling:25124} -Counseled to monitor BP at home ***, document, and provide log at future appointments -{CCMPHARMDINTERVENTION:25122}  Hyperlipidemia/CAD/PVD: (LDL goal < ***) -{US controlled/uncontrolled:25276} -Current treatment: *** -Medications previously tried: ***  -Current dietary patterns: *** -Current exercise habits: *** -Educated on {CCM HLD Counseling:25126} -{CCMPHARMDINTERVENTION:25122}  Diabetes (A1c goal {A1c goals:23924}) -{US controlled/uncontrolled:25276} -Current medications: *** -Medications previously tried: ***  -Current home glucose readings fasting glucose: *** post prandial glucose: *** -{ACTIONS;DENIES/REPORTS:21021675::"Denies"} hypoglycemic/hyperglycemic symptoms -Current meal patterns:  breakfast: ***  lunch: ***  dinner: *** snacks: *** drinks: *** -Current exercise: *** -Educated on {CCM DM COUNSELING:25123} -Counseled to check feet daily and get yearly eye exams -{CCMPHARMDINTERVENTION:25122}  Depression/Anxiety (Goal: ***) -{US controlled/uncontrolled:25276} -Current treatment: *** -Medications previously tried/failed: *** -PHQ9: *** -GAD7: *** -Connected with *** for mental health support -Educated on {CCM mental health counseling:25127} -{CCMPHARMDINTERVENTION:25122}  RLS (Goal: ***) -{US controlled/uncontrolled:25276} -Current treatment  *** -Medications previously tried: ***  -{CCMPHARMDINTERVENTION:25122}  Hypothyroidism (Goal: ***) -{US controlled/uncontrolled:25276} -Current treatment  *** -Medications previously tried: ***  -{CCMPHARMDINTERVENTION:25122}  Dementia (Goal: ***) -{US controlled/uncontrolled:25276} -Current treatment  *** -Medications previously tried: ***   -{CCMPHARMDINTERVENTION:25122}  Patient Goals/Self-Care Activities Patient will:  - {pharmacypatientgoals:24919}  Follow Up Plan: {CM FOLLOW UP IZXY:81188}

## 2021-01-03 ENCOUNTER — Telehealth: Payer: Self-pay

## 2021-01-03 DIAGNOSIS — R3989 Other symptoms and signs involving the genitourinary system: Secondary | ICD-10-CM

## 2021-01-03 NOTE — Telephone Encounter (Signed)
Pt's daughter LM on triage line requesting results of urine cx. Please advise.

## 2021-01-03 NOTE — Telephone Encounter (Signed)
Results have not yet finalized.

## 2021-01-04 MED ORDER — FOSFOMYCIN TROMETHAMINE 3 G PO PACK: 3.0000 g | PACK | Freq: Once | ORAL | 0 refills | Status: AC

## 2021-01-04 NOTE — Telephone Encounter (Signed)
Called pt's daughter informed her of the information below. Daughter voiced understanding. RX sent in.

## 2021-01-04 NOTE — Telephone Encounter (Signed)
Incoming message on triage line from pt's daughter who states she is upset she has not heard anything in regards to patient's culture status. Please advise.

## 2021-01-04 NOTE — Telephone Encounter (Signed)
Urine culture still has not finalized. Ok to send in fosfomycin x1 dose for empiric treatment.

## 2021-01-07 ENCOUNTER — Telehealth: Payer: Self-pay | Admitting: *Deleted

## 2021-01-07 LAB — CULTURE, URINE COMPREHENSIVE

## 2021-01-07 NOTE — Telephone Encounter (Signed)
-----   Message from Debroah Loop, [place] sent at 01/07/2021  1:45 PM EDT ----- Urine culture just finalized. Fosfomycin appropriate empiric treatment. ----- Message ----- From: Interface, [facility] Lab Results In Sent: 01/01/2021   1:36 PM EDT To: Debroah Loop, PA-C

## 2021-01-07 NOTE — Telephone Encounter (Signed)
Notified patient as instructed, patient pleased °

## 2021-01-16 ENCOUNTER — Ambulatory Visit: Payer: Medicare Other | Admitting: Nurse Practitioner

## 2021-01-17 ENCOUNTER — Ambulatory Visit: Payer: Self-pay | Admitting: Urology

## 2021-01-21 ENCOUNTER — Ambulatory Visit: Payer: Medicare Other | Admitting: Nurse Practitioner

## 2021-01-21 DIAGNOSIS — R634 Abnormal weight loss: Secondary | ICD-10-CM | POA: Diagnosis not present

## 2021-01-21 DIAGNOSIS — R441 Visual hallucinations: Secondary | ICD-10-CM | POA: Diagnosis not present

## 2021-01-21 DIAGNOSIS — R413 Other amnesia: Secondary | ICD-10-CM | POA: Diagnosis not present

## 2021-01-21 DIAGNOSIS — F0391 Unspecified dementia with behavioral disturbance: Secondary | ICD-10-CM | POA: Diagnosis not present

## 2021-01-22 ENCOUNTER — Ambulatory Visit: Payer: Medicare Other | Admitting: Internal Medicine

## 2021-01-23 ENCOUNTER — Ambulatory Visit: Payer: Self-pay | Admitting: Urology

## 2021-01-25 DIAGNOSIS — N390 Urinary tract infection, site not specified: Secondary | ICD-10-CM | POA: Diagnosis not present

## 2021-01-25 DIAGNOSIS — M1990 Unspecified osteoarthritis, unspecified site: Secondary | ICD-10-CM | POA: Diagnosis not present

## 2021-01-25 DIAGNOSIS — F0280 Dementia in other diseases classified elsewhere without behavioral disturbance: Secondary | ICD-10-CM | POA: Diagnosis not present

## 2021-01-25 DIAGNOSIS — I2510 Atherosclerotic heart disease of native coronary artery without angina pectoris: Secondary | ICD-10-CM | POA: Diagnosis not present

## 2021-01-25 DIAGNOSIS — E785 Hyperlipidemia, unspecified: Secondary | ICD-10-CM | POA: Diagnosis not present

## 2021-01-25 DIAGNOSIS — D126 Benign neoplasm of colon, unspecified: Secondary | ICD-10-CM | POA: Diagnosis not present

## 2021-01-25 DIAGNOSIS — G309 Alzheimer's disease, unspecified: Secondary | ICD-10-CM | POA: Diagnosis not present

## 2021-01-25 DIAGNOSIS — I679 Cerebrovascular disease, unspecified: Secondary | ICD-10-CM | POA: Diagnosis not present

## 2021-01-25 DIAGNOSIS — Z9049 Acquired absence of other specified parts of digestive tract: Secondary | ICD-10-CM | POA: Diagnosis not present

## 2021-01-25 DIAGNOSIS — I10 Essential (primary) hypertension: Secondary | ICD-10-CM | POA: Diagnosis not present

## 2021-01-25 DIAGNOSIS — E1136 Type 2 diabetes mellitus with diabetic cataract: Secondary | ICD-10-CM | POA: Diagnosis not present

## 2021-01-25 DIAGNOSIS — Z90710 Acquired absence of both cervix and uterus: Secondary | ICD-10-CM | POA: Diagnosis not present

## 2021-01-25 DIAGNOSIS — I701 Atherosclerosis of renal artery: Secondary | ICD-10-CM | POA: Diagnosis not present

## 2021-01-25 DIAGNOSIS — Z87891 Personal history of nicotine dependence: Secondary | ICD-10-CM | POA: Diagnosis not present

## 2021-01-25 DIAGNOSIS — Z951 Presence of aortocoronary bypass graft: Secondary | ICD-10-CM | POA: Diagnosis not present

## 2021-01-25 DIAGNOSIS — E039 Hypothyroidism, unspecified: Secondary | ICD-10-CM | POA: Diagnosis not present

## 2021-01-31 ENCOUNTER — Other Ambulatory Visit: Payer: Self-pay

## 2021-01-31 ENCOUNTER — Encounter: Payer: Self-pay | Admitting: Internal Medicine

## 2021-01-31 ENCOUNTER — Ambulatory Visit (INDEPENDENT_AMBULATORY_CARE_PROVIDER_SITE_OTHER): Payer: Medicare Other | Admitting: Internal Medicine

## 2021-01-31 VITALS — BP 156/64 | HR 91 | Temp 98.2°F | Resp 16 | Ht 61.0 in | Wt 120.4 lb

## 2021-01-31 DIAGNOSIS — F0150 Vascular dementia without behavioral disturbance: Secondary | ICD-10-CM

## 2021-01-31 DIAGNOSIS — Z0001 Encounter for general adult medical examination with abnormal findings: Secondary | ICD-10-CM | POA: Diagnosis not present

## 2021-01-31 DIAGNOSIS — N3090 Cystitis, unspecified without hematuria: Secondary | ICD-10-CM | POA: Diagnosis not present

## 2021-01-31 DIAGNOSIS — Z5320 Procedure and treatment not carried out because of patient's decision for unspecified reasons: Secondary | ICD-10-CM

## 2021-01-31 DIAGNOSIS — Z23 Encounter for immunization: Secondary | ICD-10-CM | POA: Diagnosis not present

## 2021-01-31 DIAGNOSIS — B961 Klebsiella pneumoniae [K. pneumoniae] as the cause of diseases classified elsewhere: Secondary | ICD-10-CM

## 2021-01-31 DIAGNOSIS — I10 Essential (primary) hypertension: Secondary | ICD-10-CM

## 2021-01-31 DIAGNOSIS — N1832 Chronic kidney disease, stage 3b: Secondary | ICD-10-CM | POA: Diagnosis not present

## 2021-01-31 MED ORDER — LIDOCAINE 5 % EX PTCH: 1.0000 | MEDICATED_PATCH | CUTANEOUS | 3 refills | Status: DC

## 2021-01-31 MED ORDER — LEVOTHYROXINE SODIUM 75 MCG PO TABS: 75.0000 ug | ORAL_TABLET | Freq: Every day | ORAL | 3 refills | Status: DC

## 2021-01-31 MED ORDER — PNEUMOCOCCAL 20-VAL CONJ VACC 0.5 ML IM SUSY: 0.5000 mL | PREFILLED_SYRINGE | INTRAMUSCULAR | 0 refills | Status: AC

## 2021-01-31 NOTE — Progress Notes (Signed)
[facility] [place], [place] [address]  Internal MEDICINE  Office Visit Note  Patient Name: Maria Sanchez  [id number]  [id number]  Date of Service: 02/05/2021  Chief Complaint  Patient presents with   Medicare Wellness   Dementia     HPI Pt is here for routine health maintenance examination, patient is here with her daughter, has multiple medical problems including severe Alzheimer's disease, denies any major problems except with complaints of pain in her lower back between her buttock area and has difficulty sitting.  According to the daughter she complains about this all the time Denies any chest pain shortness of breath takes all medication as prescribed.  Has history of chronic UTI recently finished antibiotics, followed by neurology for dementia Current Medication: Outpatient Encounter Medications as of 01/31/2021  Medication Sig   allopurinol (ZYLOPRIM) 100 MG tablet TAKE 2 TABLETS BY MOUTH AT BEDTIME AS NEEDED FOR GOUT   amLODipine (NORVASC) 5 MG tablet Take 1 tablet (5 mg total) by mouth daily.   aspirin EC 81 MG tablet Take 81 mg daily by mouth.   escitalopram (LEXAPRO) 10 MG tablet TAKE 1 TABLET BY MOUTH EVERY DAY WITH SUPPER   hydrALAZINE (APRESOLINE) 25 MG tablet TAKE 1 TABLET BY MOUTH 3 TIMES A DAY FOR BLOOD PRESSURE   levothyroxine (SYNTHROID) 75 MCG tablet Take 1 tablet (75 mcg total) by mouth daily.   lidocaine (LIDODERM) 5 % Place 1 patch onto the skin daily. Remove & Discard patch within 12 hours or as directed by MD   pravastatin (PRAVACHOL) 10 MG tablet Take 1 tablet (10 mg total) by mouth at bedtime.   risperiDONE (RISPERDAL) 0.5 MG tablet Take 0.5 mg by mouth 2 (two) times daily. Takes one tablet in the morning and one tablet at night   [DISCONTINUED] levothyroxine (SYNTHROID) 112 MCG tablet Take 1 tablet (112 mcg total) by mouth daily before breakfast.   [DISCONTINUED] pneumococcal 20-Val Conj Vacc (PREVNAR 20) 0.5 ML injection  Inject 0.5 mLs into the muscle tomorrow at 10 am.   [EXPIRED] pneumococcal 20-Val Conj Vacc (PREVNAR 20) 0.5 ML injection Inject 0.5 mLs into the muscle tomorrow at 10 am for 1 dose.   [DISCONTINUED] estradiol (ESTRACE) 0.1 MG/GM vaginal cream Estrogen Cream Instruction Discard applicator Apply pea sized amount to tip of finger to urethra before bed. Wash hands well after application. Use Monday, Wednesday and Friday (Patient not taking: Reported on 01/31/2021)   [DISCONTINUED] predniSONE (DELTASONE) 10 MG tablet Take 1 tab po 3 x day for 3 dTays then take 1 tab po 2 x a day for 3 days and then take 1 tab po daily for 3 days (Patient not taking: Reported on 01/31/2021)   [DISCONTINUED] risperiDONE (RISPERDAL) 0.5 MG tablet Take 1.5 tab  po once a daily   No facility-administered encounter medications on file as of 01/31/2021.    Surgical History: Past Surgical History:  Procedure Laterality Date   ABDOMINAL HYSTERECTOMY     BREAST SURGERY     benign   CHOLECYSTECTOMY     CORONARY ARTERY BYPASS GRAFT     exicision salivary gland     MITRAL VALVE REPLACEMENT (MVR)/CORONARY ARTERY BYPASS GRAFTING (CABG)     posterolateral fusion     stent placed in kidney Right    thumb arthroplasty  06/20/2005    Medical History: Past Medical History:  Diagnosis Date   Arthritis    CAD (coronary artery disease)    Dementia ([place])  Hyperlipidemia    Hypertension    Renal artery stenosis ([place])    Thyroid disease     Family History: Family History  Problem Relation Age of Onset   Colon cancer Mother    Breast cancer Sister    Colon cancer Brother     Social History: Social History   Socioeconomic History   Marital status: Widowed    Spouse name: Not on file   Number of children: Not on file   Years of education: Not on file   Highest education level: Not on file  Occupational History   Not on file  Tobacco Use   Smoking status: Never   Smokeless tobacco: Never  Vaping Use   Vaping  Use: Never used  Substance and Sexual Activity   Alcohol use: No   Drug use: No   Sexual activity: Never  Other Topics Concern   Not on file  Social History Narrative   Not on file   Social Determinants of Health   Financial Resource Strain: Not on file  Food Insecurity: Not on file  Transportation Needs: Not on file  Physical Activity: Not on file  Stress: Not on file  Social Connections: Not on file      Review of Systems  Constitutional:  Negative for chills, fatigue and unexpected weight change.  HENT:  Negative for congestion, postnasal drip, rhinorrhea, sneezing and sore throat.   Eyes:  Negative for redness.  Respiratory:  Negative for cough, chest tightness and shortness of breath.   Cardiovascular:  Negative for chest pain and palpitations.  Gastrointestinal:  Negative for abdominal pain, constipation, diarrhea, nausea and vomiting.  Genitourinary:  Negative for dysuria and frequency.  Musculoskeletal:  Positive for back pain. Negative for arthralgias, joint swelling and neck pain.  Skin:  Negative for rash.  Neurological: Negative.  Negative for tremors and numbness.  Hematological:  Negative for adenopathy. Does not bruise/bleed easily.  Psychiatric/Behavioral:  Negative for behavioral problems (Depression), sleep disturbance and suicidal ideas. The patient is not nervous/anxious.     Vital Signs: BP (!) 156/64   Pulse 91   Temp 98.2 F (36.8 C)   Resp 16   Ht 5\' 1"  (1.549 m)   Wt 120 lb 6.4 oz (54.6 kg)   SpO2 96%   BMI 22.75 kg/m    Physical Exam Constitutional:      Appearance: Normal appearance.  HENT:     Head: Normocephalic and atraumatic.     Nose: Nose normal.     Mouth/Throat:     Mouth: Mucous membranes are moist.     Pharynx: No posterior oropharyngeal erythema.  Eyes:     Extraocular Movements: Extraocular movements intact.     Pupils: Pupils are equal, round, and reactive to light.  Cardiovascular:     Pulses: Normal pulses.      Heart sounds: Normal heart sounds.  Pulmonary:     Effort: Pulmonary effort is normal.     Breath sounds: Normal breath sounds.  Neurological:     General: No focal deficit present.     Mental Status: She is alert.  Psychiatric:        Mood and Affect: Mood normal.        Behavior: Behavior normal.     LABS: Recent Results (from the past 2160 hour(s))  Urinalysis, Complete     Status: Abnormal   Collection Time: 12/28/20  3:02 PM  Result Value Ref Range   Specific Gravity, UA 1.020 1.005 -  1.030   pH, UA 7.0 5.0 - 7.5   Color, UA Yellow Yellow   Appearance Ur Cloudy (A) Clear   Leukocytes,UA Trace (A) Negative   Protein,UA 2+ (A) Negative/Trace   Glucose, UA Negative Negative   Ketones, UA Trace (A) Negative   RBC, UA Negative Negative   Bilirubin, UA Negative Negative   Urobilinogen, Ur 1.0 0.2 - 1.0 mg/dL   Nitrite, UA Negative Negative   Microscopic Examination See below:   Microscopic Examination     Status: Abnormal   Collection Time: 12/28/20  3:02 PM   Urine  Result Value Ref Range   WBC, UA 6-10 (A) 0 - 5 /hpf   RBC 0-2 0 - 2 /hpf   Epithelial Cells (non renal) 0-10 0 - 10 /hpf   Renal Epithel, UA 0-10 (A) None seen /hpf   Bacteria, UA Many (A) None seen/Few  CULTURE, URINE COMPREHENSIVE     Status: Abnormal   Collection Time: 12/28/20  3:49 PM   Specimen: Urine   UR  Result Value Ref Range   Urine Culture, Comprehensive Final report (A)    Organism ID, Bacteria Klebsiella pneumoniae (A)     Comment: Cefazolin <=4 ug/mL Cefazolin with an MIC <=16 predicts susceptibility to the oral agents cefaclor, cefdinir, cefpodoxime, cefprozil, cefuroxime, cephalexin, and loracarbef when used for therapy of uncomplicated urinary tract infections due to E. coli, Klebsiella pneumoniae, and Proteus mirabilis. Greater than 100,000 colony forming units per mL    Organism ID, Bacteria Klebsiella pneumoniae (A)     Comment: Cefazolin <=4 ug/mL Cefazolin with an MIC <=16  predicts susceptibility to the oral agents cefaclor, cefdinir, cefpodoxime, cefprozil, cefuroxime, cephalexin, and loracarbef when used for therapy of uncomplicated urinary tract infections due to E. coli, Klebsiella pneumoniae, and Proteus mirabilis. Greater than 100,000 colony forming units per mL    ANTIMICROBIAL SUSCEPTIBILITY Comment     Comment:       ** S = Susceptible; I = Intermediate; R = Resistant **                    P = Positive; N = Negative             MICS are expressed in micrograms per mL    Antibiotic                 RSLT#1    RSLT#2    RSLT#3    RSLT#4 Amoxicillin/Clavulanic Acid    I         S Ampicillin                     R         R Cefepime                       S         S Ceftriaxone                    S         S Cefuroxime                     S         S Ciprofloxacin                  S         S Ertapenem  S         S Gentamicin                     S         S Imipenem                       I         S Levofloxacin                   S         S Meropenem                      S         S Nitrofurantoin                 S         S Piperacillin/Tazobactam        S         S Tetracycline                   S         S Tobramycin                     S         S Trimethoprim/Sulfa             S         S     Assessment/Plan: 1. Encounter for general adult medical examination with abnormal findings Patient has severe multi-infarct dementia and it is hard for her daughter to bring her to the office so she would like to discontinue most of her preventive health care maintenance that she does have power of attorney she also would like the patient to be DNR/DNI  2. Stage 3b chronic kidney disease ([place]) This is followed by nephrology continues to be stable patient does have 1 kidney  3. Benign hypertension Blood pressure is under reasonable control, will continue to monitor  4. Multi-infarct dementia without behavioral disturbance  ([place]) Continue all medications as before followed up by neurology  5. Klebsiella cystitis Patient has been recently treated we will closely monitor as she was unable to give urine specimen today  6. Mammogram declined Her daughter who has medical power of attorney does not wish to continue her mammograms  7. Need for influenza vaccinatION - Flu Vaccine MDCK QUAD PF  8. Need for Streptococcus pneumoniae vaccination - pneumococcal 20-Val Conj Vacc (PREVNAR 20) 0.5 ML injection; Inject 0.5 mLs into the muscle tomorrow at 10 am for 1 dose.  Dispense: 0.5 mL; Refill: 0   General Counseling: Marylouise verbalizes understanding of the findings of todays visit and agrees with plan of treatment. I have discussed any further diagnostic evaluation that may be needed or ordered today. We also reviewed her medications today. she has been encouraged to call the office with any questions or concerns that should arise related to todays visit.    Counseling:  [place] Controlled Substance Database was reviewed by me.  Orders Placed This Encounter  Procedures   Flu Vaccine MDCK QUAD PF    Meds ordered this encounter  Medications   lidocaine (LIDODERM) 5 %    Sig: Place 1 patch onto the skin daily. Remove & Discard patch within 12 hours or as directed by MD    Dispense:  30 patch  Refill:  3   levothyroxine (SYNTHROID) 75 MCG tablet    Sig: Take 1 tablet (75 mcg total) by mouth daily.    Dispense:  90 tablet    Refill:  3   pneumococcal 20-Val Conj Vacc (PREVNAR 20) 0.5 ML injection    Sig: Inject 0.5 mLs into the muscle tomorrow at 10 am for 1 dose.    Dispense:  0.5 mL    Refill:  0    Total time spent:35 Minutes  Time spent includes review of chart, medications, test results, and follow up plan with the patient.     Lavera Guise, MD  Internal Medicine

## 2021-02-01 DIAGNOSIS — F0280 Dementia in other diseases classified elsewhere without behavioral disturbance: Secondary | ICD-10-CM | POA: Diagnosis not present

## 2021-02-01 DIAGNOSIS — I10 Essential (primary) hypertension: Secondary | ICD-10-CM | POA: Diagnosis not present

## 2021-02-01 DIAGNOSIS — N390 Urinary tract infection, site not specified: Secondary | ICD-10-CM | POA: Diagnosis not present

## 2021-02-01 DIAGNOSIS — G309 Alzheimer's disease, unspecified: Secondary | ICD-10-CM | POA: Diagnosis not present

## 2021-02-01 DIAGNOSIS — E1136 Type 2 diabetes mellitus with diabetic cataract: Secondary | ICD-10-CM | POA: Diagnosis not present

## 2021-02-01 DIAGNOSIS — I679 Cerebrovascular disease, unspecified: Secondary | ICD-10-CM | POA: Diagnosis not present

## 2021-02-08 DIAGNOSIS — I679 Cerebrovascular disease, unspecified: Secondary | ICD-10-CM | POA: Diagnosis not present

## 2021-02-08 DIAGNOSIS — G309 Alzheimer's disease, unspecified: Secondary | ICD-10-CM | POA: Diagnosis not present

## 2021-02-08 DIAGNOSIS — I10 Essential (primary) hypertension: Secondary | ICD-10-CM | POA: Diagnosis not present

## 2021-02-08 DIAGNOSIS — E1136 Type 2 diabetes mellitus with diabetic cataract: Secondary | ICD-10-CM | POA: Diagnosis not present

## 2021-02-08 DIAGNOSIS — N390 Urinary tract infection, site not specified: Secondary | ICD-10-CM | POA: Diagnosis not present

## 2021-02-08 DIAGNOSIS — I2510 Atherosclerotic heart disease of native coronary artery without angina pectoris: Secondary | ICD-10-CM | POA: Diagnosis not present

## 2021-02-08 DIAGNOSIS — F0280 Dementia in other diseases classified elsewhere without behavioral disturbance: Secondary | ICD-10-CM | POA: Diagnosis not present

## 2021-02-12 DIAGNOSIS — I10 Essential (primary) hypertension: Secondary | ICD-10-CM | POA: Diagnosis not present

## 2021-02-12 DIAGNOSIS — E1136 Type 2 diabetes mellitus with diabetic cataract: Secondary | ICD-10-CM | POA: Diagnosis not present

## 2021-02-12 DIAGNOSIS — F0280 Dementia in other diseases classified elsewhere without behavioral disturbance: Secondary | ICD-10-CM | POA: Diagnosis not present

## 2021-02-12 DIAGNOSIS — N390 Urinary tract infection, site not specified: Secondary | ICD-10-CM | POA: Diagnosis not present

## 2021-02-12 DIAGNOSIS — I679 Cerebrovascular disease, unspecified: Secondary | ICD-10-CM | POA: Diagnosis not present

## 2021-02-12 DIAGNOSIS — G309 Alzheimer's disease, unspecified: Secondary | ICD-10-CM | POA: Diagnosis not present

## 2021-02-24 DIAGNOSIS — Z87891 Personal history of nicotine dependence: Secondary | ICD-10-CM | POA: Diagnosis not present

## 2021-02-24 DIAGNOSIS — M1990 Unspecified osteoarthritis, unspecified site: Secondary | ICD-10-CM | POA: Diagnosis not present

## 2021-02-24 DIAGNOSIS — Z9049 Acquired absence of other specified parts of digestive tract: Secondary | ICD-10-CM | POA: Diagnosis not present

## 2021-02-24 DIAGNOSIS — E785 Hyperlipidemia, unspecified: Secondary | ICD-10-CM | POA: Diagnosis not present

## 2021-02-24 DIAGNOSIS — I701 Atherosclerosis of renal artery: Secondary | ICD-10-CM | POA: Diagnosis not present

## 2021-02-24 DIAGNOSIS — F0280 Dementia in other diseases classified elsewhere without behavioral disturbance: Secondary | ICD-10-CM | POA: Diagnosis not present

## 2021-02-24 DIAGNOSIS — G309 Alzheimer's disease, unspecified: Secondary | ICD-10-CM | POA: Diagnosis not present

## 2021-02-24 DIAGNOSIS — I2510 Atherosclerotic heart disease of native coronary artery without angina pectoris: Secondary | ICD-10-CM | POA: Diagnosis not present

## 2021-02-24 DIAGNOSIS — E1136 Type 2 diabetes mellitus with diabetic cataract: Secondary | ICD-10-CM | POA: Diagnosis not present

## 2021-02-24 DIAGNOSIS — N390 Urinary tract infection, site not specified: Secondary | ICD-10-CM | POA: Diagnosis not present

## 2021-02-24 DIAGNOSIS — Z90710 Acquired absence of both cervix and uterus: Secondary | ICD-10-CM | POA: Diagnosis not present

## 2021-02-24 DIAGNOSIS — I679 Cerebrovascular disease, unspecified: Secondary | ICD-10-CM | POA: Diagnosis not present

## 2021-02-24 DIAGNOSIS — D126 Benign neoplasm of colon, unspecified: Secondary | ICD-10-CM | POA: Diagnosis not present

## 2021-02-24 DIAGNOSIS — Z951 Presence of aortocoronary bypass graft: Secondary | ICD-10-CM | POA: Diagnosis not present

## 2021-02-24 DIAGNOSIS — I10 Essential (primary) hypertension: Secondary | ICD-10-CM | POA: Diagnosis not present

## 2021-02-24 DIAGNOSIS — E039 Hypothyroidism, unspecified: Secondary | ICD-10-CM | POA: Diagnosis not present

## 2021-02-26 ENCOUNTER — Other Ambulatory Visit: Payer: Self-pay | Admitting: Nurse Practitioner

## 2021-02-26 ENCOUNTER — Other Ambulatory Visit: Payer: Self-pay

## 2021-02-26 DIAGNOSIS — E782 Mixed hyperlipidemia: Secondary | ICD-10-CM

## 2021-02-26 DIAGNOSIS — M1A9XX0 Chronic gout, unspecified, without tophus (tophi): Secondary | ICD-10-CM

## 2021-02-26 MED ORDER — PRAVASTATIN SODIUM 10 MG PO TABS: 10.0000 mg | ORAL_TABLET | Freq: Every day | ORAL | 11 refills | Status: DC

## 2021-02-27 ENCOUNTER — Other Ambulatory Visit: Payer: Self-pay

## 2021-02-27 ENCOUNTER — Other Ambulatory Visit: Payer: Self-pay | Admitting: Nurse Practitioner

## 2021-02-27 DIAGNOSIS — M1A9XX0 Chronic gout, unspecified, without tophus (tophi): Secondary | ICD-10-CM

## 2021-02-27 MED ORDER — ALLOPURINOL 100 MG PO TABS
ORAL_TABLET | ORAL | 3 refills | Status: DC
Start: 2021-02-27 — End: 2022-02-11

## 2021-03-07 DIAGNOSIS — G309 Alzheimer's disease, unspecified: Secondary | ICD-10-CM | POA: Diagnosis not present

## 2021-03-07 DIAGNOSIS — N390 Urinary tract infection, site not specified: Secondary | ICD-10-CM | POA: Diagnosis not present

## 2021-03-07 DIAGNOSIS — F0280 Dementia in other diseases classified elsewhere without behavioral disturbance: Secondary | ICD-10-CM | POA: Diagnosis not present

## 2021-03-07 DIAGNOSIS — I679 Cerebrovascular disease, unspecified: Secondary | ICD-10-CM | POA: Diagnosis not present

## 2021-03-07 DIAGNOSIS — E1136 Type 2 diabetes mellitus with diabetic cataract: Secondary | ICD-10-CM | POA: Diagnosis not present

## 2021-03-07 DIAGNOSIS — I10 Essential (primary) hypertension: Secondary | ICD-10-CM | POA: Diagnosis not present

## 2021-03-08 DIAGNOSIS — F0280 Dementia in other diseases classified elsewhere without behavioral disturbance: Secondary | ICD-10-CM | POA: Diagnosis not present

## 2021-03-08 DIAGNOSIS — G309 Alzheimer's disease, unspecified: Secondary | ICD-10-CM | POA: Diagnosis not present

## 2021-03-08 DIAGNOSIS — N390 Urinary tract infection, site not specified: Secondary | ICD-10-CM | POA: Diagnosis not present

## 2021-03-08 DIAGNOSIS — I679 Cerebrovascular disease, unspecified: Secondary | ICD-10-CM | POA: Diagnosis not present

## 2021-03-08 DIAGNOSIS — I10 Essential (primary) hypertension: Secondary | ICD-10-CM | POA: Diagnosis not present

## 2021-03-08 DIAGNOSIS — E1136 Type 2 diabetes mellitus with diabetic cataract: Secondary | ICD-10-CM | POA: Diagnosis not present

## 2021-03-11 DIAGNOSIS — F0280 Dementia in other diseases classified elsewhere without behavioral disturbance: Secondary | ICD-10-CM | POA: Diagnosis not present

## 2021-03-11 DIAGNOSIS — I10 Essential (primary) hypertension: Secondary | ICD-10-CM | POA: Diagnosis not present

## 2021-03-11 DIAGNOSIS — G309 Alzheimer's disease, unspecified: Secondary | ICD-10-CM | POA: Diagnosis not present

## 2021-03-11 DIAGNOSIS — N390 Urinary tract infection, site not specified: Secondary | ICD-10-CM | POA: Diagnosis not present

## 2021-03-11 DIAGNOSIS — E1136 Type 2 diabetes mellitus with diabetic cataract: Secondary | ICD-10-CM | POA: Diagnosis not present

## 2021-03-11 DIAGNOSIS — I679 Cerebrovascular disease, unspecified: Secondary | ICD-10-CM | POA: Diagnosis not present

## 2021-03-12 DIAGNOSIS — I10 Essential (primary) hypertension: Secondary | ICD-10-CM | POA: Diagnosis not present

## 2021-03-12 DIAGNOSIS — I679 Cerebrovascular disease, unspecified: Secondary | ICD-10-CM | POA: Diagnosis not present

## 2021-03-12 DIAGNOSIS — N390 Urinary tract infection, site not specified: Secondary | ICD-10-CM | POA: Diagnosis not present

## 2021-03-12 DIAGNOSIS — G309 Alzheimer's disease, unspecified: Secondary | ICD-10-CM | POA: Diagnosis not present

## 2021-03-12 DIAGNOSIS — F0280 Dementia in other diseases classified elsewhere without behavioral disturbance: Secondary | ICD-10-CM | POA: Diagnosis not present

## 2021-03-12 DIAGNOSIS — E1136 Type 2 diabetes mellitus with diabetic cataract: Secondary | ICD-10-CM | POA: Diagnosis not present

## 2021-03-14 DIAGNOSIS — F0280 Dementia in other diseases classified elsewhere without behavioral disturbance: Secondary | ICD-10-CM | POA: Diagnosis not present

## 2021-03-14 DIAGNOSIS — G309 Alzheimer's disease, unspecified: Secondary | ICD-10-CM | POA: Diagnosis not present

## 2021-03-14 DIAGNOSIS — I679 Cerebrovascular disease, unspecified: Secondary | ICD-10-CM | POA: Diagnosis not present

## 2021-03-14 DIAGNOSIS — E1136 Type 2 diabetes mellitus with diabetic cataract: Secondary | ICD-10-CM | POA: Diagnosis not present

## 2021-03-14 DIAGNOSIS — I10 Essential (primary) hypertension: Secondary | ICD-10-CM | POA: Diagnosis not present

## 2021-03-14 DIAGNOSIS — N390 Urinary tract infection, site not specified: Secondary | ICD-10-CM | POA: Diagnosis not present

## 2021-03-19 DIAGNOSIS — N390 Urinary tract infection, site not specified: Secondary | ICD-10-CM | POA: Diagnosis not present

## 2021-03-19 DIAGNOSIS — F0280 Dementia in other diseases classified elsewhere without behavioral disturbance: Secondary | ICD-10-CM | POA: Diagnosis not present

## 2021-03-19 DIAGNOSIS — E1136 Type 2 diabetes mellitus with diabetic cataract: Secondary | ICD-10-CM | POA: Diagnosis not present

## 2021-03-19 DIAGNOSIS — G309 Alzheimer's disease, unspecified: Secondary | ICD-10-CM | POA: Diagnosis not present

## 2021-03-19 DIAGNOSIS — I10 Essential (primary) hypertension: Secondary | ICD-10-CM | POA: Diagnosis not present

## 2021-03-19 DIAGNOSIS — I679 Cerebrovascular disease, unspecified: Secondary | ICD-10-CM | POA: Diagnosis not present

## 2021-03-27 ENCOUNTER — Other Ambulatory Visit: Payer: Self-pay

## 2021-03-27 DIAGNOSIS — I10 Essential (primary) hypertension: Secondary | ICD-10-CM

## 2021-03-27 MED ORDER — ESCITALOPRAM OXALATE 10 MG PO TABS: ORAL_TABLET | ORAL | 0 refills | Status: DC

## 2021-03-27 MED ORDER — AMLODIPINE BESYLATE 5 MG PO TABS: 5.0000 mg | ORAL_TABLET | Freq: Every day | ORAL | 1 refills | Status: DC

## 2021-04-09 ENCOUNTER — Encounter: Payer: Self-pay | Admitting: Physician Assistant

## 2021-04-09 ENCOUNTER — Other Ambulatory Visit: Payer: Self-pay

## 2021-04-09 ENCOUNTER — Ambulatory Visit (INDEPENDENT_AMBULATORY_CARE_PROVIDER_SITE_OTHER): Payer: Medicare Other | Admitting: Physician Assistant

## 2021-04-09 VITALS — BP 168/70 | HR 85 | Ht 61.0 in | Wt 118.0 lb

## 2021-04-09 DIAGNOSIS — R3989 Other symptoms and signs involving the genitourinary system: Secondary | ICD-10-CM

## 2021-04-09 LAB — URINALYSIS, COMPLETE
Bilirubin, UA: NEGATIVE
Glucose, UA: NEGATIVE
Ketones, UA: NEGATIVE
Nitrite, UA: NEGATIVE
RBC, UA: NEGATIVE
Specific Gravity, UA: 1.015 (ref 1.005–1.030)
Urobilinogen, Ur: 0.2 mg/dL (ref 0.2–1.0)
pH, UA: 7.5 (ref 5.0–7.5)

## 2021-04-09 LAB — MICROSCOPIC EXAMINATION: RBC, Urine: NONE SEEN /hpf (ref 0–2)

## 2021-04-09 MED ORDER — FOSFOMYCIN TROMETHAMINE 3 G PO PACK: 3.0000 g | PACK | Freq: Once | ORAL | 0 refills | Status: AC

## 2021-04-09 NOTE — Progress Notes (Signed)
In and Out Catheterization  Patient is present today for a I & O catheterization due to dysuria. Patient was cleaned and prepped in a sterile fashion with betadine . A 14FR cath was inserted no complications were noted , 9ml of urine return was noted, urine was cloudy in color. A clean urine sample was collected for urinalysis. Bladder was drained  And catheter was removed with out difficulty.    Performed by: Bradly Bienenstock Morton RMA

## 2021-04-09 NOTE — Progress Notes (Signed)
04/09/2021 3:49 PM   Maria Sanchez [birth year] [id number]  CC: Chief Complaint  Patient presents with   Dysuria   HPI: Maria Sanchez is a 87 y.o. female with PMH dementia, renal artery stenosis with atrophic right kidney, and multidrug-resistant recurrent UTIs who presents today for evaluation of possible UTI.  She is accompanied today by her daughter, who contributes to HPI.  Today her daughter reports approximately 1 week of increased confusion and fecal incontinence.  She typically reports malodorous urine with UTIs but notes this has not been as pronounced as previous.  They deny fever, chills, nausea, vomiting, gross hematuria, and dysuria.  In-office catheterized UA today positive for 2+ blood and 1+ leukocyte esterase; urine microscopy with triple phosphate and amorphous crystals and many bacteria.   PMH: Past Medical History:  Diagnosis Date   Arthritis    CAD (coronary artery disease)    Dementia ([place])    Hyperlipidemia    Hypertension    Renal artery stenosis ([facility])    Thyroid disease     Surgical History: Past Surgical History:  Procedure Laterality Date   ABDOMINAL HYSTERECTOMY     BREAST SURGERY     benign   CHOLECYSTECTOMY     CORONARY ARTERY BYPASS GRAFT     exicision salivary gland     MITRAL VALVE REPLACEMENT (MVR)/CORONARY ARTERY BYPASS GRAFTING (CABG)     posterolateral fusion     stent placed in kidney Right    thumb arthroplasty  06/20/2005    Home Medications:  Allergies as of 04/09/2021       Reactions   Morphine And Related Other (See Comments)   "makes me crazy"   Requip [ropinirole Hcl]         Medication List        Accurate as of April 09, 2021  3:49 PM. If you have any questions, ask your nurse or doctor.          allopurinol 100 MG tablet Commonly known as: ZYLOPRIM TAKE 2 TABLETS BY MOUTH AT BEDTIME AS NEEDED FOR GOUT   amLODipine 5 MG tablet Commonly known as: NORVASC Take 1 tablet (5 mg total) by mouth  daily.   aspirin EC 81 MG tablet Take 81 mg daily by mouth.   escitalopram 10 MG tablet Commonly known as: LEXAPRO TAKE 1 TABLET BY MOUTH EVERY DAY WITH SUPPER   hydrALAZINE 25 MG tablet Commonly known as: APRESOLINE TAKE 1 TABLET BY MOUTH 3 TIMES A DAY FOR BLOOD PRESSURE   levothyroxine 75 MCG tablet Commonly known as: SYNTHROID Take 1 tablet (75 mcg total) by mouth daily.   lidocaine 5 % Commonly known as: Lidoderm Place 1 patch onto the skin daily. Remove & Discard patch within 12 hours or as directed by MD   pravastatin 10 MG tablet Commonly known as: PRAVACHOL Take 1 tablet (10 mg total) by mouth at bedtime.   risperiDONE 0.5 MG tablet Commonly known as: RISPERDAL Take 0.5 mg by mouth 2 (two) times daily. Takes one tablet in the morning and one tablet at night        Allergies:  Allergies  Allergen Reactions   Morphine And Related Other (See Comments)    "makes me crazy"   Requip [Ropinirole Hcl]     Family History: Family History  Problem Relation Age of Onset   Colon cancer Mother    Breast cancer Sister    Colon cancer Brother     Social History:   reports  that she has never smoked. She has never used smokeless tobacco. She reports that she does not drink alcohol and does not use drugs.  Physical Exam: BP (!) 168/70    Pulse 85    Ht 5\' 1"  (1.549 m)    Wt 118 lb (53.5 kg)    BMI 22.30 kg/m   Constitutional:  Alert, no acute distress, nontoxic appearing HEENT: [place], AT Cardiovascular: No clubbing, cyanosis, or edema Respiratory: Normal respiratory effort, no increased work of breathing Skin: No rashes, bruises or suspicious lesions Neurologic: Grossly intact, no focal deficits, moving all 4 extremities Psychiatric: Normal mood and affect  Laboratory Data: Results for orders placed or performed in visit on 04/09/21  Microscopic Examination   Urine  Result Value Ref Range   WBC, UA 0-5 0 - 5 /hpf   RBC None seen 0 - 2 /hpf   Epithelial Cells  (non renal) 0-10 0 - 10 /hpf   Renal Epithel, UA 0-10 (A) None seen /hpf   Crystals Present (A) N/A   Crystal Type Amorphous Sediment N/A   Bacteria, UA Many (A) None seen/Few  Urinalysis, Complete  Result Value Ref Range   Specific Gravity, UA 1.015 1.005 - 1.030   pH, UA 7.5 5.0 - 7.5   Color, UA Yellow Yellow   Appearance Ur Cloudy (A) Clear   Leukocytes,UA 1+ (A) Negative   Protein,UA 2+ (A) Negative/Trace   Glucose, UA Negative Negative   Ketones, UA Negative Negative   RBC, UA Negative Negative   Bilirubin, UA Negative Negative   Urobilinogen, Ur 0.2 0.2 - 1.0 mg/dL   Nitrite, UA Negative Negative   Microscopic Examination See below:    Assessment & Plan:   1. Suspected UTI UA today is notable for bacteriuria in the absence of pyuria.  We discussed that this is more consistent with asymptomatic bacteriuria/urinary colonization that would not require treatment, however in the setting of acute mental status change, patient's daughter wishes to proceed with antibiotic therapy.  We will administer 1 dose of fosfomycin, however I counseled the patient's daughter that they should follow-up with the patient's PCP or neurologist for further evaluation if her cognitive status does not improve after antibiotics. - Urinalysis, Complete - CULTURE, URINE COMPREHENSIVE - fosfomycin (MONUROL) 3 g PACK; Take 3 g by mouth once for 1 dose.  Dispense: 3 g; Refill: 0   Return if symptoms worsen or fail to improve.  Debroah Loop, PA-C  [facility] [address], [place], [place] [address] [phone]

## 2021-04-20 LAB — CULTURE, URINE COMPREHENSIVE

## 2021-05-29 ENCOUNTER — Other Ambulatory Visit: Payer: Self-pay

## 2021-05-29 MED ORDER — RISPERIDONE 0.5 MG PO TABS: 0.5000 mg | ORAL_TABLET | Freq: Two times a day (BID) | ORAL | 0 refills | Status: AC

## 2021-06-13 ENCOUNTER — Telehealth: Payer: Self-pay | Admitting: Urology

## 2021-06-13 NOTE — Telephone Encounter (Signed)
Appt scheduled with Dr. Bernardo Heater 06/14/2021

## 2021-06-13 NOTE — Telephone Encounter (Signed)
Pts daughter called in stating the pt has a uti (pt is confused and urine is very strong and smelly) and wants to know if something can be called in for her. She was last seen by Sam 04/09/21. Michela Pitcher it is hard for her to get the pt out and around. Would like a call back, informed her it may not be until tomorrow.

## 2021-06-14 ENCOUNTER — Other Ambulatory Visit: Payer: Self-pay

## 2021-06-14 ENCOUNTER — Encounter: Payer: Self-pay | Admitting: Urology

## 2021-06-14 ENCOUNTER — Ambulatory Visit (INDEPENDENT_AMBULATORY_CARE_PROVIDER_SITE_OTHER): Payer: Medicare Other | Admitting: Urology

## 2021-06-14 VITALS — BP 158/70 | HR 87 | Ht 61.0 in | Wt 117.0 lb

## 2021-06-14 DIAGNOSIS — N390 Urinary tract infection, site not specified: Secondary | ICD-10-CM | POA: Diagnosis not present

## 2021-06-14 LAB — MICROSCOPIC EXAMINATION: RBC, Urine: NONE SEEN /hpf (ref 0–2)

## 2021-06-14 LAB — URINALYSIS, COMPLETE
Bilirubin, UA: NEGATIVE
Glucose, UA: NEGATIVE
Nitrite, UA: NEGATIVE
RBC, UA: NEGATIVE
Specific Gravity, UA: 1.025 (ref 1.005–1.030)
Urobilinogen, Ur: 0.2 mg/dL (ref 0.2–1.0)
pH, UA: 6.5 (ref 5.0–7.5)

## 2021-06-14 MED ORDER — FOSFOMYCIN TROMETHAMINE 3 G PO PACK: 3.0000 g | PACK | Freq: Once | ORAL | 0 refills | Status: AC

## 2021-06-14 MED ORDER — CEFUROXIME AXETIL 250 MG PO TABS: 250.0000 mg | ORAL_TABLET | Freq: Two times a day (BID) | ORAL | 0 refills | Status: DC

## 2021-06-14 NOTE — Progress Notes (Signed)
06/14/2021 3:03 PM   Maria Sanchez Jun 08, 1930 [id number]  Referring provider: Lavera Guise, MD [address] [place],  [place] [address]  Chief Complaint  Patient presents with   Recurrent UTI    HPI: 88 y.o. female scheduled for acute visit by her daughter for UTI symptoms.  See Maria Sanchez's previous note dated 04/09/2021 Urine culture at that visit grew E. coli and Klebsiella.  She was treated with fosfomycin and daughter states her symptoms resolved however they recur approximately every 3 months.  Daughter has noted increased confusion and malodorous urine.  No fever, chills  PMH: Past Medical History:  Diagnosis Date   Arthritis    CAD (coronary artery disease)    Dementia ([place])    Hyperlipidemia    Hypertension    Renal artery stenosis ([facility])    Thyroid disease     Surgical History: Past Surgical History:  Procedure Laterality Date   ABDOMINAL HYSTERECTOMY     BREAST SURGERY     benign   CHOLECYSTECTOMY     CORONARY ARTERY BYPASS GRAFT     exicision salivary gland     MITRAL VALVE REPLACEMENT (MVR)/CORONARY ARTERY BYPASS GRAFTING (CABG)     posterolateral fusion     stent placed in kidney Right    thumb arthroplasty  06/20/2005    Home Medications:  Allergies as of 06/14/2021       Reactions   Morphine And Related Other (See Comments)   "makes me crazy"   Requip [ropinirole Hcl]         Medication List        Accurate as of June 14, 2021  3:03 PM. If you have any questions, ask your nurse or doctor.          allopurinol 100 MG tablet Commonly known as: ZYLOPRIM TAKE 2 TABLETS BY MOUTH AT BEDTIME AS NEEDED FOR GOUT   amLODipine 5 MG tablet Commonly known as: NORVASC Take 1 tablet (5 mg total) by mouth daily.   aspirin EC 81 MG tablet Take 81 mg daily by mouth.   cefUROXime 250 MG tablet Commonly known as: CEFTIN Take 1 tablet (250 mg total) by mouth 2 (two) times daily with a meal. Started by: Maria Sons, MD    escitalopram 10 MG tablet Commonly known as: LEXAPRO TAKE 1 TABLET BY MOUTH EVERY DAY WITH SUPPER   fosfomycin 3 g Pack Commonly known as: MONUROL Take 3 g by mouth once for 1 dose. Started by: Maria Sons, MD   hydrALAZINE 25 MG tablet Commonly known as: APRESOLINE TAKE 1 TABLET BY MOUTH 3 TIMES A DAY FOR BLOOD PRESSURE   levothyroxine 75 MCG tablet Commonly known as: SYNTHROID Take 1 tablet (75 mcg total) by mouth daily.   lidocaine 5 % Commonly known as: Lidoderm Place 1 patch onto the skin daily. Remove & Discard patch within 12 hours or as directed by MD   pravastatin 10 MG tablet Commonly known as: PRAVACHOL Take 1 tablet (10 mg total) by mouth at bedtime.   risperiDONE 0.5 MG tablet Commonly known as: RISPERDAL Take 1 tablet (0.5 mg total) by mouth 2 (two) times daily. Takes one tablet in the morning and one tablet at night        Allergies:  Allergies  Allergen Reactions   Morphine And Related Other (See Comments)    "makes me crazy"   Requip [Ropinirole Hcl]     Family History: Family History  Problem Relation Age of  Onset   Colon cancer Mother    Breast cancer Sister    Colon cancer Brother     Social History:  reports that she has never smoked. She has never used smokeless tobacco. She reports that she does not drink alcohol and does not use drugs.   Physical Exam: BP (!) 158/70    Pulse 87    Ht 5\' 1"  (1.549 m)    Wt 117 lb (53.1 kg)    BMI 22.11 kg/m   Constitutional:  Alert, no acute distress. HEENT: [place] AT, moist mucus membranes.  Trachea midline, no masses. Cardiovascular: No clubbing, cyanosis, or edema. Respiratory: Normal respiratory effort, no increased work of breathing.  Laboratory Data:  Urinalysis Dipstick 2+ protein/trace leukocytes/trace ketones Microscopy no significant WBC/RBC, many bacteria  Assessment & Plan:    1. Recurrent UTI  Urine culture ordered Her daughter thought fosfomycin worked very well and Rx was  sent to pharmacy.  She states it typically takes at least 1 day to order and will probably not be available until Monday.  Will start on cefuroxime 250 mg twice daily x3 days.  Both organisms last culture were sensitive to cefuroxime She inquired about urine drop-offs and we discussed it is office policy that patients to be seen.  The alternative of a telephone visit was discussed if daughter was able to bring in a urine   Maria Sons, MD  [facility] [address] [place],  [address] [phone]

## 2021-06-14 NOTE — Progress Notes (Signed)
In and Out Catheterization  Patient is present today for a I & O catheterization due to UTI. Patient was cleaned and prepped in a sterile fashion with betadine . A 14FR cath was inserted no complications were noted , 170ml of urine return was noted, urine was dark yellow in color. A clean urine sample was collected for UA, UCX. Bladder was drained  And catheter was removed with out difficulty.    Performed by: Elberta Leatherwood, [place]

## 2021-06-22 LAB — CULTURE, URINE COMPREHENSIVE

## 2021-06-24 ENCOUNTER — Telehealth: Payer: Self-pay | Admitting: *Deleted

## 2021-06-24 NOTE — Telephone Encounter (Signed)
Notified daughter patient as instructed,

## 2021-06-24 NOTE — Telephone Encounter (Signed)
Patient daughter states that the power stuff worked great

## 2021-06-24 NOTE — Telephone Encounter (Signed)
-----   Message from Abbie Sons, MD sent at 06/23/2021 12:17 PM EST ----- Urine culture positive for E. coli and was only resistant to ampicillin

## 2021-06-26 ENCOUNTER — Other Ambulatory Visit: Payer: Self-pay

## 2021-06-26 MED ORDER — ESCITALOPRAM OXALATE 10 MG PO TABS: ORAL_TABLET | ORAL | 0 refills | Status: DC

## 2021-07-02 ENCOUNTER — Encounter: Payer: Self-pay | Admitting: Internal Medicine

## 2021-07-22 DIAGNOSIS — R262 Difficulty in walking, not elsewhere classified: Secondary | ICD-10-CM | POA: Diagnosis not present

## 2021-07-22 DIAGNOSIS — G309 Alzheimer's disease, unspecified: Secondary | ICD-10-CM | POA: Diagnosis not present

## 2021-07-22 DIAGNOSIS — R413 Other amnesia: Secondary | ICD-10-CM | POA: Diagnosis not present

## 2021-07-22 DIAGNOSIS — R443 Hallucinations, unspecified: Secondary | ICD-10-CM | POA: Diagnosis not present

## 2021-07-22 DIAGNOSIS — F0280 Dementia in other diseases classified elsewhere without behavioral disturbance: Secondary | ICD-10-CM | POA: Diagnosis not present

## 2021-07-26 ENCOUNTER — Other Ambulatory Visit: Payer: Self-pay

## 2021-07-26 MED ORDER — HYDRALAZINE HCL 25 MG PO TABS: ORAL_TABLET | ORAL | 2 refills | Status: DC

## 2021-07-28 DIAGNOSIS — F0282 Dementia in other diseases classified elsewhere, unspecified severity, with psychotic disturbance: Secondary | ICD-10-CM | POA: Diagnosis not present

## 2021-07-28 DIAGNOSIS — E119 Type 2 diabetes mellitus without complications: Secondary | ICD-10-CM | POA: Diagnosis not present

## 2021-07-28 DIAGNOSIS — Z86018 Personal history of other benign neoplasm: Secondary | ICD-10-CM | POA: Diagnosis not present

## 2021-07-28 DIAGNOSIS — Z960 Presence of urogenital implants: Secondary | ICD-10-CM | POA: Diagnosis not present

## 2021-07-28 DIAGNOSIS — I10 Essential (primary) hypertension: Secondary | ICD-10-CM | POA: Diagnosis not present

## 2021-07-28 DIAGNOSIS — N390 Urinary tract infection, site not specified: Secondary | ICD-10-CM | POA: Diagnosis not present

## 2021-07-28 DIAGNOSIS — G309 Alzheimer's disease, unspecified: Secondary | ICD-10-CM | POA: Diagnosis not present

## 2021-07-28 DIAGNOSIS — E785 Hyperlipidemia, unspecified: Secondary | ICD-10-CM | POA: Diagnosis not present

## 2021-07-28 DIAGNOSIS — M1990 Unspecified osteoarthritis, unspecified site: Secondary | ICD-10-CM | POA: Diagnosis not present

## 2021-07-28 DIAGNOSIS — I2510 Atherosclerotic heart disease of native coronary artery without angina pectoris: Secondary | ICD-10-CM | POA: Diagnosis not present

## 2021-07-28 DIAGNOSIS — Z981 Arthrodesis status: Secondary | ICD-10-CM | POA: Diagnosis not present

## 2021-07-28 DIAGNOSIS — Z87891 Personal history of nicotine dependence: Secondary | ICD-10-CM | POA: Diagnosis not present

## 2021-07-28 DIAGNOSIS — H269 Unspecified cataract: Secondary | ICD-10-CM | POA: Diagnosis not present

## 2021-07-28 DIAGNOSIS — Z9049 Acquired absence of other specified parts of digestive tract: Secondary | ICD-10-CM | POA: Diagnosis not present

## 2021-07-28 DIAGNOSIS — Z951 Presence of aortocoronary bypass graft: Secondary | ICD-10-CM | POA: Diagnosis not present

## 2021-07-28 DIAGNOSIS — E039 Hypothyroidism, unspecified: Secondary | ICD-10-CM | POA: Diagnosis not present

## 2021-07-28 DIAGNOSIS — I38 Endocarditis, valve unspecified: Secondary | ICD-10-CM | POA: Diagnosis not present

## 2021-07-28 DIAGNOSIS — Z8619 Personal history of other infectious and parasitic diseases: Secondary | ICD-10-CM | POA: Diagnosis not present

## 2021-07-28 DIAGNOSIS — I701 Atherosclerosis of renal artery: Secondary | ICD-10-CM | POA: Diagnosis not present

## 2021-07-29 ENCOUNTER — Ambulatory Visit: Payer: Medicare Other | Admitting: Internal Medicine

## 2021-07-30 ENCOUNTER — Ambulatory Visit (INDEPENDENT_AMBULATORY_CARE_PROVIDER_SITE_OTHER): Payer: Medicare Other | Admitting: Nurse Practitioner

## 2021-07-30 ENCOUNTER — Encounter: Payer: Self-pay | Admitting: Nurse Practitioner

## 2021-07-30 VITALS — BP 147/70 | HR 75 | Temp 98.3°F | Resp 16 | Ht 60.0 in | Wt 117.0 lb

## 2021-07-30 DIAGNOSIS — E039 Hypothyroidism, unspecified: Secondary | ICD-10-CM | POA: Diagnosis not present

## 2021-07-30 DIAGNOSIS — N1832 Chronic kidney disease, stage 3b: Secondary | ICD-10-CM | POA: Diagnosis not present

## 2021-07-30 DIAGNOSIS — F0150 Vascular dementia without behavioral disturbance: Secondary | ICD-10-CM | POA: Diagnosis not present

## 2021-07-30 DIAGNOSIS — I10 Essential (primary) hypertension: Secondary | ICD-10-CM | POA: Diagnosis not present

## 2021-07-30 DIAGNOSIS — I739 Peripheral vascular disease, unspecified: Secondary | ICD-10-CM | POA: Diagnosis not present

## 2021-07-30 NOTE — Progress Notes (Addendum)
[facility] ?[address] ?[place], [place] [address] ? ?Internal MEDICINE  ?Office Visit Note ? ?Patient Name: Maria Sanchez ? [id number]  ?[id number] ? ?Date of Service: 07/30/2021 ? ?Chief Complaint  ?Patient presents with  ? Follow-up  ?  Fell and hit head this morning, has been falling more often recently, loss of bowel control, unstable when walking, pain in tail bone, discuss meds, swelling both feet  ? Hyperlipidemia  ? Hypertension  ? ? ?HPI ?Maria Sanchez presents for follow-up visit for hypertension, hypothyroidism and kidney disease.  Patient had a recent fall last night and also had a previous fall in January where she hit her head both times.  There is acute injury with swelling and a scabbed area on the top of the forehead no other signs of injury. ?She takes amlodipine 5 mg daily and her daughter and sitter have noticed swelling in her lower extremities and feet.  She does have some low back pain and pain in her tailbone and she sits on a donut cushion with gel ?She now has a 24-hour private care sitter that stays with her at all times due to her dementia and increased risk of injury.  Blood pressure and vital signs are stable at this time. ?Patient is high risk for falls and does need assistance with ambulation. She would benefit from a transport chair or lift chair, her daughter plans on calling the insurance to find out if she is eligible to have this assistive device covered by her insurance.  ? ?Current Medication: ?Outpatient Encounter Medications as of 07/30/2021  ?Medication Sig  ? allopurinol (ZYLOPRIM) 100 MG tablet TAKE 2 TABLETS BY MOUTH AT BEDTIME AS NEEDED FOR GOUT  ? amLODipine (NORVASC) 5 MG tablet Take 1 tablet (5 mg total) by mouth daily.  ? aspirin EC 81 MG tablet Take 81 mg daily by mouth.  ? cefUROXime (CEFTIN) 250 MG tablet Take 1 tablet (250 mg total) by mouth 2 (two) times daily with a meal.  ? escitalopram (LEXAPRO) 10 MG tablet TAKE 1 TABLET BY MOUTH EVERY DAY WITH SUPPER  ?  hydrALAZINE (APRESOLINE) 25 MG tablet TAKE 1 TABLET BY MOUTH 3 TIMES A DAY FOR BLOOD PRESSURE  ? levothyroxine (SYNTHROID) 75 MCG tablet Take 1 tablet (75 mcg total) by mouth daily.  ? lidocaine (LIDODERM) 5 % Place 1 patch onto the skin daily. Remove & Discard patch within 12 hours or as directed by MD  ? pravastatin (PRAVACHOL) 10 MG tablet Take 1 tablet (10 mg total) by mouth at bedtime.  ? risperiDONE (RISPERDAL) 0.5 MG tablet Take 1 tablet (0.5 mg total) by mouth 2 (two) times daily. Takes one tablet in the morning and one tablet at night  ? ?No facility-administered encounter medications on file as of 07/30/2021.  ? ? ?Surgical History: ?Past Surgical History:  ?Procedure Laterality Date  ? ABDOMINAL HYSTERECTOMY    ? BREAST SURGERY    ? benign  ? CHOLECYSTECTOMY    ? CORONARY ARTERY BYPASS GRAFT    ? exicision salivary gland    ? MITRAL VALVE REPLACEMENT (MVR)/CORONARY ARTERY BYPASS GRAFTING (CABG)    ? posterolateral fusion    ? stent placed in kidney Right   ? thumb arthroplasty  06/20/2005  ? ? ?Medical History: ?Past Medical History:  ?Diagnosis Date  ? Arthritis   ? CAD (coronary artery disease)   ? Dementia ([place])   ? Hyperlipidemia   ? Hypertension   ? Renal artery stenosis ([place])   ?  Thyroid disease   ? ? ?Family History: ?Family History  ?Problem Relation Age of Onset  ? Colon cancer Mother   ? Breast cancer Sister   ? Colon cancer Brother   ? ? ?Social History  ? ?Socioeconomic History  ? Marital status: Widowed  ?  Spouse name: Not on file  ? Number of children: Not on file  ? Years of education: Not on file  ? Highest education level: Not on file  ?Occupational History  ? Not on file  ?Tobacco Use  ? Smoking status: Never  ? Smokeless tobacco: Never  ?Vaping Use  ? Vaping Use: Never used  ?Substance and Sexual Activity  ? Alcohol use: No  ? Drug use: No  ? Sexual activity: Never  ?Other Topics Concern  ? Not on file  ?Social History Narrative  ? Not on file  ? ?Social Determinants of Health   ? ?Financial Resource Strain: Not on file  ?Food Insecurity: Not on file  ?Transportation Needs: Not on file  ?Physical Activity: Not on file  ?Stress: Not on file  ?Social Connections: Not on file  ?Intimate Partner Violence: Not on file  ? ? ? ? ?Review of Systems  ?Constitutional:  Negative for chills, fatigue and unexpected weight change.  ?HENT:  Negative for congestion, postnasal drip, rhinorrhea, sneezing and sore throat.   ?Eyes:  Negative for redness.  ?Respiratory:  Negative for cough, chest tightness and shortness of breath.   ?Cardiovascular:  Negative for chest pain and palpitations.  ?Gastrointestinal:  Negative for abdominal pain, constipation, diarrhea, nausea and vomiting.  ?Genitourinary:  Negative for dysuria and frequency.  ?Musculoskeletal:  Positive for back pain. Negative for arthralgias, joint swelling and neck pain.  ?Skin:  Negative for rash.  ?Neurological: Negative.  Negative for tremors and numbness.  ?Hematological:  Negative for adenopathy. Does not bruise/bleed easily.  ?Psychiatric/Behavioral:  Negative for behavioral problems (Depression), sleep disturbance and suicidal ideas. The patient is not nervous/anxious.   ? ?Vital Signs: ?BP (!) 147/70   Pulse 75   Temp 98.3 ?F (36.8 ?C)   Resp 16   Ht 5' (1.524 m)   Wt 117 lb (53.1 kg)   SpO2 98%   BMI 22.85 kg/m?  ? ? ?Physical Exam ?Constitutional:   ?   Appearance: Normal appearance.  ?HENT:  ?   Head: Normocephalic and atraumatic.  ?Eyes:  ?   Extraocular Movements: Extraocular movements intact.  ?   Pupils: Pupils are equal, round, and reactive to light.  ?Cardiovascular:  ?   Rate and Rhythm: Normal rate and regular rhythm.  ?Pulmonary:  ?   Breath sounds: Normal breath sounds.  ?Musculoskeletal:  ?   Right lower leg: Edema (slight) present.  ?   Left lower leg: Edema (slight) present.  ?Neurological:  ?   General: No focal deficit present.  ?   Mental Status: She is alert. Mental status is at baseline.  ?Psychiatric:     ?    Behavior: Behavior normal.  ? ? ? ? ? ?Assessment/Plan: ?1. Benign hypertension ?Stable, no refills needed, no changes at this time.  ? ?2. Stage 3b chronic kidney disease ([place]) ?Labs ordered ?- CMP14+EGFR ?- CBC with Differential/Platelet ?- TSH + free T4 ? ?3. Peripheral vascular disease ([place]) ?Stable labs ordered ?- CMP14+EGFR ?- CBC with Differential/Platelet ?- TSH + free T4 ? ?4. Acquired hypothyroidism ?Stable labs ordered ?- CMP14+EGFR ?- CBC with Differential/Platelet ?- TSH + free T4 ? ?5. Multi-infarct dementia without  behavioral disturbance ([place]) ?Stable, has 24 hour sitter at home with her which is helping, she is at her baseline right now. She requires assistance with ADLs and ambulation, her daughter will find out if a transport chair or lift chair would be covered by her insurance.  ? ? ?General Counseling: dante cooter understanding of the findings of todays visit and agrees with plan of treatment. I have discussed any further diagnostic evaluation that may be needed or ordered today. We also reviewed her medications today. she has been encouraged to call the office with any questions or concerns that should arise related to todays visit. ? ? ? ?Orders Placed This Encounter  ?Procedures  ? CMP14+EGFR  ? CBC with Differential/Platelet  ? TSH + free T4  ? ? ?No orders of the defined types were placed in this encounter. ? ? ?Return in about 3 months (around 10/29/2021) for F/U, med refill, Crystel Demarco PCP. ? ? ?Total time spent:30 Minutes ?Time spent includes review of chart, medications, test results, and follow up plan with the patient.  ? ?[place] Controlled Substance Database was reviewed by me. ? ?This patient was seen by Jonetta Osgood, FNP-C in collaboration with Dr. Clayborn Bigness as a part of collaborative care agreement. ? ? ?Janille Draughon R. Valetta Fuller, MSN, FNP-C ?Internal medicine  ?

## 2021-08-01 ENCOUNTER — Ambulatory Visit: Payer: Medicare Other | Admitting: Nurse Practitioner

## 2021-08-13 DIAGNOSIS — I10 Essential (primary) hypertension: Secondary | ICD-10-CM | POA: Diagnosis not present

## 2021-08-13 DIAGNOSIS — G309 Alzheimer's disease, unspecified: Secondary | ICD-10-CM | POA: Diagnosis not present

## 2021-08-13 DIAGNOSIS — I701 Atherosclerosis of renal artery: Secondary | ICD-10-CM | POA: Diagnosis not present

## 2021-08-13 DIAGNOSIS — F0282 Dementia in other diseases classified elsewhere, unspecified severity, with psychotic disturbance: Secondary | ICD-10-CM | POA: Diagnosis not present

## 2021-08-13 DIAGNOSIS — N390 Urinary tract infection, site not specified: Secondary | ICD-10-CM | POA: Diagnosis not present

## 2021-08-13 DIAGNOSIS — E119 Type 2 diabetes mellitus without complications: Secondary | ICD-10-CM | POA: Diagnosis not present

## 2021-08-15 ENCOUNTER — Telehealth: Payer: Self-pay

## 2021-08-16 ENCOUNTER — Telehealth: Payer: Self-pay

## 2021-08-16 DIAGNOSIS — I10 Essential (primary) hypertension: Secondary | ICD-10-CM | POA: Diagnosis not present

## 2021-08-16 DIAGNOSIS — N390 Urinary tract infection, site not specified: Secondary | ICD-10-CM | POA: Diagnosis not present

## 2021-08-16 DIAGNOSIS — F0282 Dementia in other diseases classified elsewhere, unspecified severity, with psychotic disturbance: Secondary | ICD-10-CM | POA: Diagnosis not present

## 2021-08-16 DIAGNOSIS — E119 Type 2 diabetes mellitus without complications: Secondary | ICD-10-CM | POA: Diagnosis not present

## 2021-08-16 DIAGNOSIS — G309 Alzheimer's disease, unspecified: Secondary | ICD-10-CM | POA: Diagnosis not present

## 2021-08-16 DIAGNOSIS — I701 Atherosclerosis of renal artery: Secondary | ICD-10-CM | POA: Diagnosis not present

## 2021-08-16 NOTE — Telephone Encounter (Signed)
Printed off office notes and Alyssa wrote prescription for transport wheelchair and faxed to [facility] per [facility] home health nurse at [facility]: [phone] ?

## 2021-08-16 NOTE — Telephone Encounter (Signed)
Pt's caregiver Joslyn Devon advised pt needs transport wheelchair with 4 small wheels ?

## 2021-08-21 DIAGNOSIS — G309 Alzheimer's disease, unspecified: Secondary | ICD-10-CM | POA: Diagnosis not present

## 2021-08-21 DIAGNOSIS — E119 Type 2 diabetes mellitus without complications: Secondary | ICD-10-CM | POA: Diagnosis not present

## 2021-08-21 DIAGNOSIS — I701 Atherosclerosis of renal artery: Secondary | ICD-10-CM | POA: Diagnosis not present

## 2021-08-21 DIAGNOSIS — I10 Essential (primary) hypertension: Secondary | ICD-10-CM | POA: Diagnosis not present

## 2021-08-21 DIAGNOSIS — F0282 Dementia in other diseases classified elsewhere, unspecified severity, with psychotic disturbance: Secondary | ICD-10-CM | POA: Diagnosis not present

## 2021-08-21 DIAGNOSIS — N390 Urinary tract infection, site not specified: Secondary | ICD-10-CM | POA: Diagnosis not present

## 2021-08-26 DIAGNOSIS — I10 Essential (primary) hypertension: Secondary | ICD-10-CM | POA: Diagnosis not present

## 2021-08-26 DIAGNOSIS — F0282 Dementia in other diseases classified elsewhere, unspecified severity, with psychotic disturbance: Secondary | ICD-10-CM | POA: Diagnosis not present

## 2021-08-26 DIAGNOSIS — N390 Urinary tract infection, site not specified: Secondary | ICD-10-CM | POA: Diagnosis not present

## 2021-08-26 DIAGNOSIS — G309 Alzheimer's disease, unspecified: Secondary | ICD-10-CM | POA: Diagnosis not present

## 2021-08-26 DIAGNOSIS — I38 Endocarditis, valve unspecified: Secondary | ICD-10-CM | POA: Diagnosis not present

## 2021-08-26 DIAGNOSIS — E119 Type 2 diabetes mellitus without complications: Secondary | ICD-10-CM | POA: Diagnosis not present

## 2021-08-26 DIAGNOSIS — I701 Atherosclerosis of renal artery: Secondary | ICD-10-CM | POA: Diagnosis not present

## 2021-08-27 DIAGNOSIS — M1990 Unspecified osteoarthritis, unspecified site: Secondary | ICD-10-CM | POA: Diagnosis not present

## 2021-08-27 DIAGNOSIS — F0282 Dementia in other diseases classified elsewhere, unspecified severity, with psychotic disturbance: Secondary | ICD-10-CM | POA: Diagnosis not present

## 2021-08-27 DIAGNOSIS — Z86018 Personal history of other benign neoplasm: Secondary | ICD-10-CM | POA: Diagnosis not present

## 2021-08-27 DIAGNOSIS — G309 Alzheimer's disease, unspecified: Secondary | ICD-10-CM | POA: Diagnosis not present

## 2021-08-27 DIAGNOSIS — I38 Endocarditis, valve unspecified: Secondary | ICD-10-CM | POA: Diagnosis not present

## 2021-08-27 DIAGNOSIS — Z960 Presence of urogenital implants: Secondary | ICD-10-CM | POA: Diagnosis not present

## 2021-08-27 DIAGNOSIS — E039 Hypothyroidism, unspecified: Secondary | ICD-10-CM | POA: Diagnosis not present

## 2021-08-27 DIAGNOSIS — I10 Essential (primary) hypertension: Secondary | ICD-10-CM | POA: Diagnosis not present

## 2021-08-27 DIAGNOSIS — E119 Type 2 diabetes mellitus without complications: Secondary | ICD-10-CM | POA: Diagnosis not present

## 2021-08-27 DIAGNOSIS — Z87891 Personal history of nicotine dependence: Secondary | ICD-10-CM | POA: Diagnosis not present

## 2021-08-27 DIAGNOSIS — H269 Unspecified cataract: Secondary | ICD-10-CM | POA: Diagnosis not present

## 2021-08-27 DIAGNOSIS — N390 Urinary tract infection, site not specified: Secondary | ICD-10-CM | POA: Diagnosis not present

## 2021-08-27 DIAGNOSIS — I2510 Atherosclerotic heart disease of native coronary artery without angina pectoris: Secondary | ICD-10-CM | POA: Diagnosis not present

## 2021-08-27 DIAGNOSIS — Z8619 Personal history of other infectious and parasitic diseases: Secondary | ICD-10-CM | POA: Diagnosis not present

## 2021-08-27 DIAGNOSIS — Z9049 Acquired absence of other specified parts of digestive tract: Secondary | ICD-10-CM | POA: Diagnosis not present

## 2021-08-27 DIAGNOSIS — Z951 Presence of aortocoronary bypass graft: Secondary | ICD-10-CM | POA: Diagnosis not present

## 2021-08-27 DIAGNOSIS — I701 Atherosclerosis of renal artery: Secondary | ICD-10-CM | POA: Diagnosis not present

## 2021-08-27 DIAGNOSIS — Z981 Arthrodesis status: Secondary | ICD-10-CM | POA: Diagnosis not present

## 2021-08-27 DIAGNOSIS — E785 Hyperlipidemia, unspecified: Secondary | ICD-10-CM | POA: Diagnosis not present

## 2021-08-28 DIAGNOSIS — I701 Atherosclerosis of renal artery: Secondary | ICD-10-CM | POA: Diagnosis not present

## 2021-08-28 DIAGNOSIS — G309 Alzheimer's disease, unspecified: Secondary | ICD-10-CM | POA: Diagnosis not present

## 2021-08-28 DIAGNOSIS — N390 Urinary tract infection, site not specified: Secondary | ICD-10-CM | POA: Diagnosis not present

## 2021-08-28 DIAGNOSIS — F0282 Dementia in other diseases classified elsewhere, unspecified severity, with psychotic disturbance: Secondary | ICD-10-CM | POA: Diagnosis not present

## 2021-08-28 DIAGNOSIS — E119 Type 2 diabetes mellitus without complications: Secondary | ICD-10-CM | POA: Diagnosis not present

## 2021-08-28 DIAGNOSIS — I10 Essential (primary) hypertension: Secondary | ICD-10-CM | POA: Diagnosis not present

## 2021-09-05 DIAGNOSIS — N390 Urinary tract infection, site not specified: Secondary | ICD-10-CM | POA: Diagnosis not present

## 2021-09-05 DIAGNOSIS — E119 Type 2 diabetes mellitus without complications: Secondary | ICD-10-CM | POA: Diagnosis not present

## 2021-09-05 DIAGNOSIS — I701 Atherosclerosis of renal artery: Secondary | ICD-10-CM | POA: Diagnosis not present

## 2021-09-05 DIAGNOSIS — I10 Essential (primary) hypertension: Secondary | ICD-10-CM | POA: Diagnosis not present

## 2021-09-05 DIAGNOSIS — G309 Alzheimer's disease, unspecified: Secondary | ICD-10-CM | POA: Diagnosis not present

## 2021-09-05 DIAGNOSIS — F0282 Dementia in other diseases classified elsewhere, unspecified severity, with psychotic disturbance: Secondary | ICD-10-CM | POA: Diagnosis not present

## 2021-09-11 ENCOUNTER — Encounter: Payer: Self-pay | Admitting: Internal Medicine

## 2021-09-12 DIAGNOSIS — N390 Urinary tract infection, site not specified: Secondary | ICD-10-CM | POA: Diagnosis not present

## 2021-09-12 DIAGNOSIS — E119 Type 2 diabetes mellitus without complications: Secondary | ICD-10-CM | POA: Diagnosis not present

## 2021-09-12 DIAGNOSIS — I10 Essential (primary) hypertension: Secondary | ICD-10-CM | POA: Diagnosis not present

## 2021-09-12 DIAGNOSIS — G309 Alzheimer's disease, unspecified: Secondary | ICD-10-CM | POA: Diagnosis not present

## 2021-09-12 DIAGNOSIS — I701 Atherosclerosis of renal artery: Secondary | ICD-10-CM | POA: Diagnosis not present

## 2021-09-12 DIAGNOSIS — F0282 Dementia in other diseases classified elsewhere, unspecified severity, with psychotic disturbance: Secondary | ICD-10-CM | POA: Diagnosis not present

## 2021-09-13 ENCOUNTER — Other Ambulatory Visit: Payer: Self-pay

## 2021-09-13 ENCOUNTER — Encounter: Payer: Self-pay | Admitting: Internal Medicine

## 2021-09-13 MED ORDER — LIDOCAINE 5 % EX PTCH: 1.0000 | MEDICATED_PATCH | CUTANEOUS | 3 refills | Status: DC

## 2021-09-24 ENCOUNTER — Other Ambulatory Visit: Payer: Self-pay

## 2021-09-24 DIAGNOSIS — N390 Urinary tract infection, site not specified: Secondary | ICD-10-CM | POA: Diagnosis not present

## 2021-09-24 DIAGNOSIS — G309 Alzheimer's disease, unspecified: Secondary | ICD-10-CM | POA: Diagnosis not present

## 2021-09-24 DIAGNOSIS — I10 Essential (primary) hypertension: Secondary | ICD-10-CM

## 2021-09-24 DIAGNOSIS — I701 Atherosclerosis of renal artery: Secondary | ICD-10-CM | POA: Diagnosis not present

## 2021-09-24 DIAGNOSIS — E119 Type 2 diabetes mellitus without complications: Secondary | ICD-10-CM | POA: Diagnosis not present

## 2021-09-24 DIAGNOSIS — F0282 Dementia in other diseases classified elsewhere, unspecified severity, with psychotic disturbance: Secondary | ICD-10-CM | POA: Diagnosis not present

## 2021-09-24 MED ORDER — AMLODIPINE BESYLATE 5 MG PO TABS: 5.0000 mg | ORAL_TABLET | Freq: Every day | ORAL | 1 refills | Status: DC

## 2021-09-24 MED ORDER — ESCITALOPRAM OXALATE 10 MG PO TABS: ORAL_TABLET | ORAL | 1 refills | Status: DC

## 2021-09-26 ENCOUNTER — Other Ambulatory Visit: Payer: Self-pay

## 2021-09-26 MED ORDER — FOSFOMYCIN TROMETHAMINE 3 G PO PACK
3.0000 g | PACK | Freq: Once | ORAL | 0 refills | Status: AC
Start: 2021-09-26 — End: 2021-09-26

## 2021-12-16 ENCOUNTER — Other Ambulatory Visit: Payer: Self-pay

## 2021-12-16 ENCOUNTER — Telehealth: Payer: Self-pay

## 2021-12-16 MED ORDER — FOSFOMYCIN TROMETHAMINE 3 G PO PACK: 3.0000 g | PACK | Freq: Once | ORAL | 0 refills | Status: AC

## 2021-12-16 NOTE — Telephone Encounter (Signed)
Pt daughter called pt had UTI as per dr Humphrey Rolls send fosfomycin 3 grams used once

## 2022-01-15 ENCOUNTER — Other Ambulatory Visit: Payer: Self-pay

## 2022-01-15 MED ORDER — LEVOTHYROXINE SODIUM 75 MCG PO TABS: 75.0000 ug | ORAL_TABLET | Freq: Every day | ORAL | 1 refills | Status: DC

## 2022-02-03 ENCOUNTER — Ambulatory Visit: Payer: Medicare Other | Admitting: Internal Medicine

## 2022-02-10 ENCOUNTER — Ambulatory Visit: Payer: Medicare Other | Admitting: Internal Medicine

## 2022-02-11 ENCOUNTER — Other Ambulatory Visit: Payer: Self-pay

## 2022-02-11 DIAGNOSIS — M1A9XX0 Chronic gout, unspecified, without tophus (tophi): Secondary | ICD-10-CM

## 2022-02-11 DIAGNOSIS — E782 Mixed hyperlipidemia: Secondary | ICD-10-CM

## 2022-02-11 MED ORDER — PRAVASTATIN SODIUM 10 MG PO TABS: 10.0000 mg | ORAL_TABLET | Freq: Every day | ORAL | 11 refills | Status: DC

## 2022-02-11 MED ORDER — HYDRALAZINE HCL 25 MG PO TABS: ORAL_TABLET | ORAL | 2 refills | Status: DC

## 2022-02-11 MED ORDER — ALLOPURINOL 100 MG PO TABS: ORAL_TABLET | ORAL | 3 refills | Status: DC

## 2022-03-05 DIAGNOSIS — G309 Alzheimer's disease, unspecified: Secondary | ICD-10-CM | POA: Diagnosis not present

## 2022-03-05 DIAGNOSIS — F03B18 Unspecified dementia, moderate, with other behavioral disturbance: Secondary | ICD-10-CM | POA: Diagnosis not present

## 2022-03-05 DIAGNOSIS — F0280 Dementia in other diseases classified elsewhere without behavioral disturbance: Secondary | ICD-10-CM | POA: Diagnosis not present

## 2022-03-05 DIAGNOSIS — R262 Difficulty in walking, not elsewhere classified: Secondary | ICD-10-CM | POA: Diagnosis not present

## 2022-03-05 DIAGNOSIS — R634 Abnormal weight loss: Secondary | ICD-10-CM | POA: Diagnosis not present

## 2022-03-05 DIAGNOSIS — R443 Hallucinations, unspecified: Secondary | ICD-10-CM | POA: Diagnosis not present

## 2022-03-11 ENCOUNTER — Other Ambulatory Visit: Payer: Self-pay

## 2022-03-11 DIAGNOSIS — I10 Essential (primary) hypertension: Secondary | ICD-10-CM

## 2022-03-11 MED ORDER — AMLODIPINE BESYLATE 5 MG PO TABS: 5.0000 mg | ORAL_TABLET | Freq: Every day | ORAL | 1 refills | Status: DC

## 2022-03-11 MED ORDER — ESCITALOPRAM OXALATE 10 MG PO TABS: ORAL_TABLET | ORAL | 1 refills | Status: DC

## 2022-03-13 ENCOUNTER — Ambulatory Visit (INDEPENDENT_AMBULATORY_CARE_PROVIDER_SITE_OTHER): Payer: Medicare Other | Admitting: Internal Medicine

## 2022-03-13 ENCOUNTER — Encounter: Payer: Self-pay | Admitting: Internal Medicine

## 2022-03-13 VITALS — BP 149/81 | HR 70 | Temp 97.9°F | Resp 16 | Ht 60.0 in | Wt 115.0 lb

## 2022-03-13 DIAGNOSIS — N390 Urinary tract infection, site not specified: Secondary | ICD-10-CM | POA: Diagnosis not present

## 2022-03-13 DIAGNOSIS — F324 Major depressive disorder, single episode, in partial remission: Secondary | ICD-10-CM

## 2022-03-13 DIAGNOSIS — Z5320 Procedure and treatment not carried out because of patient's decision for unspecified reasons: Secondary | ICD-10-CM | POA: Diagnosis not present

## 2022-03-13 DIAGNOSIS — I10 Essential (primary) hypertension: Secondary | ICD-10-CM | POA: Diagnosis not present

## 2022-03-13 DIAGNOSIS — B9620 Unspecified Escherichia coli [E. coli] as the cause of diseases classified elsewhere: Secondary | ICD-10-CM | POA: Diagnosis not present

## 2022-03-13 DIAGNOSIS — F0150 Vascular dementia without behavioral disturbance: Secondary | ICD-10-CM | POA: Diagnosis not present

## 2022-03-13 DIAGNOSIS — Z0001 Encounter for general adult medical examination with abnormal findings: Secondary | ICD-10-CM

## 2022-03-13 NOTE — Progress Notes (Signed)
[facility] [place], [place] [address]  Internal MEDICINE  Office Visit Note  Patient Name: Maria Sanchez  [id number]  [id number]  Date of Service: 05/22/2022  Chief Complaint  Patient presents with   Medicare Wellness   Hyperlipidemia   Hypertension   Quality Metric Gaps    TDAP, Flu Vaccine     HPI Pt is here for routine health maintenance examination. Denies any major complaints - pt is here with her daughter who is the caregiver. Severe dementia Chronic UTI, takes fosfomycin as needed  Bp is under good control, mildly elevated today  Weight is stable    Current Medication: Outpatient Encounter Medications as of 03/13/2022  Medication Sig   allopurinol (ZYLOPRIM) 100 MG tablet TAKE 2 TABLETS BY MOUTH AT BEDTIME AS NEEDED FOR GOUT   amLODipine (NORVASC) 5 MG tablet Take 1 tablet (5 mg total) by mouth daily.   aspirin EC 81 MG tablet Take 81 mg daily by mouth.   cefUROXime (CEFTIN) 250 MG tablet Take 1 tablet (250 mg total) by mouth 2 (two) times daily with a meal.   escitalopram (LEXAPRO) 10 MG tablet TAKE 1 TABLET BY MOUTH EVERY DAY WITH SUPPER   hydrALAZINE (APRESOLINE) 25 MG tablet TAKE 1 TABLET BY MOUTH 3 TIMES A DAY FOR BLOOD PRESSURE   levothyroxine (SYNTHROID) 75 MCG tablet Take 1 tablet (75 mcg total) by mouth daily.   lidocaine (LIDODERM) 5 % Place 1 patch onto the skin daily. Remove & Discard patch within 12 hours or as directed by MD   nitrofurantoin, macrocrystal-monohydrate, (MACROBID) 100 MG capsule Take 100 mg by mouth 2 (two) times daily.   pravastatin (PRAVACHOL) 10 MG tablet Take 1 tablet (10 mg total) by mouth at bedtime.   risperiDONE (RISPERDAL) 0.5 MG tablet Take 1 tablet (0.5 mg total) by mouth 2 (two) times daily. Takes one tablet in the morning and one tablet at night   No facility-administered encounter medications on file as of 03/13/2022.    Surgical History: Past Surgical History:  Procedure Laterality Date    ABDOMINAL HYSTERECTOMY     BREAST SURGERY     benign   CHOLECYSTECTOMY     CORONARY ARTERY BYPASS GRAFT     exicision salivary gland     MITRAL VALVE REPLACEMENT (MVR)/CORONARY ARTERY BYPASS GRAFTING (CABG)     posterolateral fusion     stent placed in kidney Right    thumb arthroplasty  06/20/2005    Medical History: Past Medical History:  Diagnosis Date   Arthritis    CAD (coronary artery disease)    Dementia ([place])    Hyperlipidemia    Hypertension    Renal artery stenosis (HCC)    Thyroid disease     Family History: Family History  Problem Relation Age of Onset   Colon cancer Mother    Breast cancer Sister    Colon cancer Brother     Social History: Social History   Socioeconomic History   Marital status: Widowed    Spouse name: Not on file   Number of children: Not on file   Years of education: Not on file   Highest education level: Not on file  Occupational History   Not on file  Tobacco Use   Smoking status: Never   Smokeless tobacco: Never  Vaping Use   Vaping Use: Never used  Substance and Sexual Activity   Alcohol use: No   Drug use: No   Sexual activity: Never  Other Topics Concern   Not on file  Social History Narrative   Not on file   Social Determinants of Health   Financial Resource Strain: Not on file  Food Insecurity: Not on file  Transportation Needs: Not on file  Physical Activity: Not on file  Stress: Not on file  Social Connections: Not on file      Review of Systems  Constitutional:  Negative for chills, fatigue, fever and unexpected weight change.  HENT:  Negative for congestion, mouth sores, postnasal drip, rhinorrhea, sneezing and sore throat.   Eyes:  Negative for redness.  Respiratory:  Negative for cough, chest tightness and shortness of breath.   Cardiovascular:  Negative for chest pain and palpitations.  Gastrointestinal:  Negative for abdominal pain, constipation, diarrhea, nausea and vomiting.  Genitourinary:   Negative for dysuria, flank pain and frequency.  Musculoskeletal:  Negative for arthralgias, back pain, joint swelling and neck pain.  Skin:  Negative for rash.  Neurological:  Negative for tremors and numbness.       Memory loss   Hematological:  Negative for adenopathy. Does not bruise/bleed easily.  Psychiatric/Behavioral: Negative.  Negative for behavioral problems (Depression), sleep disturbance and suicidal ideas. The patient is not nervous/anxious.      Vital Signs: BP (!) 149/81   Pulse 70   Temp 97.9 F (36.6 C)   Resp 16   Ht 5' (1.524 m)   Wt 115 lb (52.2 kg)   SpO2 97%   BMI 22.46 kg/m    Physical Exam Constitutional:      Appearance: Normal appearance.  HENT:     Head: Normocephalic and atraumatic.     Nose: Nose normal.     Mouth/Throat:     Mouth: Mucous membranes are moist.     Pharynx: No posterior oropharyngeal erythema.  Eyes:     Extraocular Movements: Extraocular movements intact.     Pupils: Pupils are equal, round, and reactive to light.  Cardiovascular:     Pulses: Normal pulses.     Heart sounds: Normal heart sounds.  Pulmonary:     Effort: Pulmonary effort is normal.     Breath sounds: Normal breath sounds.  Neurological:     General: No focal deficit present.     Mental Status: She is alert.  Psychiatric:        Mood and Affect: Mood normal.        Behavior: Behavior normal.     Lab Orders  No laboratory test(s) ordered today          Assessment/Plan: 1. Encounter for general adult medical examination with abnormal findings All PHM is reviewed, daughter declined mammogram as it creates undue stress on pt   2. Depression, major, single episode, in partial remission (HCC) Stable   3. Benign hypertension Controlled, home readings are normal   4. Mammogram declined Per daughter   5. E. coli urinary tract infection Unable to give urine specimen, will continue to monitor, she gets treated with Fosfomycin prn   6.  Multi-infarct dementia without behavioral disturbance ([place]) Stable    General Counseling: teriah muela understanding of the findings of todays visit and agrees with plan of treatment. I have discussed any further diagnostic evaluation that may be needed or ordered today. We also reviewed her medications today. she has been encouraged to call the office with any questions or concerns that should arise related to todays visit.    Counseling:  [place] Controlled Substance Database was reviewed by  me.  No orders of the defined types were placed in this encounter.   No orders of the defined types were placed in this encounter.   Total time spent:35 Minutes  Time spent includes review of chart, medications, test results, and follow up plan with the patient.     Lavera Guise, MD  Internal Medicine

## 2022-04-02 ENCOUNTER — Telehealth: Payer: Self-pay

## 2022-04-02 ENCOUNTER — Other Ambulatory Visit: Payer: Self-pay

## 2022-04-02 MED ORDER — FOSFOMYCIN TROMETHAMINE 3 G PO PACK: 3.0000 g | PACK | Freq: Once | ORAL | 0 refills | Status: AC

## 2022-04-02 NOTE — Telephone Encounter (Signed)
Pt daughter called that she is having UTI as per dr Humphrey Rolls sent fosfomycin 3g pack

## 2022-07-24 ENCOUNTER — Other Ambulatory Visit: Payer: Self-pay | Admitting: Internal Medicine

## 2022-08-25 ENCOUNTER — Other Ambulatory Visit: Payer: Self-pay | Admitting: Internal Medicine

## 2022-08-25 DIAGNOSIS — I10 Essential (primary) hypertension: Secondary | ICD-10-CM

## 2022-09-03 ENCOUNTER — Telehealth: Payer: Self-pay | Admitting: Internal Medicine

## 2022-09-03 NOTE — Telephone Encounter (Signed)
Daughter requesting letter to make legal and medically decisions for patient. Gave letter to dfk for signature-Toni

## 2022-09-08 ENCOUNTER — Telehealth: Payer: Self-pay | Admitting: Internal Medicine

## 2022-09-08 NOTE — Telephone Encounter (Signed)
Lvm with Maria Sanchez notifying her letter is ready to be picked up. Will leave at front desk-Toni

## 2022-09-11 ENCOUNTER — Ambulatory Visit: Payer: Medicare Other | Admitting: Internal Medicine

## 2022-09-15 ENCOUNTER — Ambulatory Visit: Payer: Medicare Other | Admitting: Internal Medicine

## 2022-09-16 ENCOUNTER — Ambulatory Visit (INDEPENDENT_AMBULATORY_CARE_PROVIDER_SITE_OTHER): Payer: Medicare Other | Admitting: Internal Medicine

## 2022-09-16 ENCOUNTER — Encounter: Payer: Self-pay | Admitting: Internal Medicine

## 2022-09-16 VITALS — BP 120/65 | HR 64 | Temp 98.0°F | Resp 16 | Ht 60.0 in | Wt 115.0 lb

## 2022-09-16 DIAGNOSIS — N390 Urinary tract infection, site not specified: Secondary | ICD-10-CM

## 2022-09-16 DIAGNOSIS — F0150 Vascular dementia without behavioral disturbance: Secondary | ICD-10-CM

## 2022-09-16 DIAGNOSIS — I10 Essential (primary) hypertension: Secondary | ICD-10-CM

## 2022-09-16 DIAGNOSIS — M24521 Contracture, right elbow: Secondary | ICD-10-CM | POA: Diagnosis not present

## 2022-09-16 DIAGNOSIS — B9620 Unspecified Escherichia coli [E. coli] as the cause of diseases classified elsewhere: Secondary | ICD-10-CM | POA: Diagnosis not present

## 2022-09-16 NOTE — Progress Notes (Signed)
[facility] [address] [place], [place] [address]  Internal MEDICINE  Office Visit Note  Patient Name: Maria Sanchez  [id number]  [id number]  Date of Service: 10/03/2022  Chief Complaint  Patient presents with   Follow-up   Hyperlipidemia   Hypertension    HPI Pt is seen for routine follow up  Daughter is c/p elbow pain and difficulty moving her arm, denies any trauma  - pt is here with her daughter who is the caregiver. Severe dementia Chronic UTI, takes fosfomycin as needed  Bp is under good control, mildly elevated today  Weight is stable    Surgical History: Past Surgical History:  Procedure Laterality Date   ABDOMINAL HYSTERECTOMY     BREAST SURGERY     benign   CHOLECYSTECTOMY     CORONARY ARTERY BYPASS GRAFT     exicision salivary gland     MITRAL VALVE REPLACEMENT (MVR)/CORONARY ARTERY BYPASS GRAFTING (CABG)     posterolateral fusion     stent placed in kidney Right    thumb arthroplasty  06/20/2005    Medical History: Past Medical History:  Diagnosis Date   Arthritis    CAD (coronary artery disease)    Dementia (HCC)    Hyperlipidemia    Hypertension    Renal artery stenosis (HCC)    Thyroid disease     Family History: Family History  Problem Relation Age of Onset   Colon cancer Mother    Breast cancer Sister    Colon cancer Brother     Social History   Socioeconomic History   Marital status: Widowed    Spouse name: Not on file   Number of children: Not on file   Years of education: Not on file   Highest education level: Not on file  Occupational History   Not on file  Tobacco Use   Smoking status: Never   Smokeless tobacco: Never  Vaping Use   Vaping Use: Never used  Substance and Sexual Activity   Alcohol use: No   Drug use: No   Sexual activity: Never  Other Topics Concern   Not on file  Social History Narrative   Not on file   Social Determinants of Health   Financial Resource Strain: Not on file  Food  Insecurity: Not on file  Transportation Needs: Not on file  Physical Activity: Not on file  Stress: Not on file  Social Connections: Not on file  Intimate Partner Violence: Not on file      Review of Systems  Constitutional:  Negative for chills, fatigue and unexpected weight change.  HENT:  Positive for postnasal drip. Negative for congestion, rhinorrhea, sneezing and sore throat.   Eyes:  Negative for redness.  Respiratory:  Negative for cough, chest tightness and shortness of breath.   Cardiovascular:  Negative for chest pain and palpitations.  Gastrointestinal:  Negative for abdominal pain, constipation, diarrhea, nausea and vomiting.  Genitourinary:  Negative for dysuria and frequency.  Musculoskeletal:  Negative for arthralgias, back pain, joint swelling and neck pain.       Right elbow stiffness   Skin:  Negative for rash.  Neurological: Negative.  Negative for tremors and numbness.  Hematological:  Negative for adenopathy. Does not bruise/bleed easily.  Psychiatric/Behavioral:  Negative for behavioral problems (Depression), sleep disturbance and suicidal ideas. The patient is not nervous/anxious.     Vital Signs: BP 120/65   Pulse 64   Temp 98 F (36.7 C)   Resp 16  Ht 5' (1.524 m)   Wt 115 lb (52.2 kg)   SpO2 98%   BMI 22.46 kg/m    Physical Exam Constitutional:      Appearance: Normal appearance.  HENT:     Head: Normocephalic and atraumatic.     Nose: Nose normal.     Mouth/Throat:     Mouth: Mucous membranes are moist.     Pharynx: No posterior oropharyngeal erythema.  Eyes:     Extraocular Movements: Extraocular movements intact.     Pupils: Pupils are equal, round, and reactive to light.  Cardiovascular:     Pulses: Normal pulses.     Heart sounds: Normal heart sounds.  Pulmonary:     Effort: Pulmonary effort is normal.     Breath sounds: Normal breath sounds.  Musculoskeletal:        General: Tenderness and deformity present.  Neurological:      General: No focal deficit present.     Mental Status: She is alert.  Psychiatric:        Mood and Affect: Mood normal.        Behavior: Behavior normal.        Assessment/Plan: 1. Contracture of right elbow Pt has contracture of right elbow, in pain, will get benefit from [facility] or release  - Ambulatory referral to Orthopedic Surgery  2. Benign hypertension Continue to monitor   3. Multi-infarct dementia without behavioral disturbance (HCC) Stable /// end stage   4. E. coli urinary tract infection Will continue to monitor    General Counseling: aubrynn bossie understanding of the findings of todays visit and agrees with plan of treatment. I have discussed any further diagnostic evaluation that may be needed or ordered today. We also reviewed her medications today. she has been encouraged to call the office with any questions or concerns that should arise related to todays visit.    Orders Placed This Encounter  Procedures   Ambulatory referral to Orthopedic Surgery    No orders of the defined types were placed in this encounter.   Total time spent:35 Minutes Time spent includes review of chart, medications, test results, and follow up plan with the patient.   [place] Controlled Substance Database was reviewed by me.   Dr Lyndon Code Internal medicine

## 2022-10-03 ENCOUNTER — Telehealth: Payer: Self-pay | Admitting: Internal Medicine

## 2022-10-03 NOTE — Telephone Encounter (Signed)
Ortho Surgery referral sent via [facility] to [facility]

## 2022-10-06 ENCOUNTER — Telehealth: Payer: Self-pay | Admitting: Internal Medicine

## 2022-10-06 NOTE — Telephone Encounter (Signed)
Per Marquis Buggy, referral has been closed. Daughter stated that the patient was exercising and wasn't able to come in for an appointment."-Sheralyn Boatman

## 2022-11-13 ENCOUNTER — Other Ambulatory Visit: Payer: Self-pay

## 2022-11-13 ENCOUNTER — Emergency Department: Payer: Medicare Other

## 2022-11-13 ENCOUNTER — Inpatient Hospital Stay
Admission: EM | Admit: 2022-11-13 | Discharge: 2022-11-16 | DRG: 690 | Disposition: A | Discharge status: Home Health | Payer: Medicare Other | Attending: Hospitalist | Admitting: Hospitalist

## 2022-11-13 ENCOUNTER — Encounter: Payer: Self-pay | Admitting: *Deleted

## 2022-11-13 DIAGNOSIS — I10 Essential (primary) hypertension: Secondary | ICD-10-CM | POA: Diagnosis not present

## 2022-11-13 DIAGNOSIS — E039 Hypothyroidism, unspecified: Secondary | ICD-10-CM | POA: Diagnosis present

## 2022-11-13 DIAGNOSIS — Z79899 Other long term (current) drug therapy: Secondary | ICD-10-CM | POA: Diagnosis not present

## 2022-11-13 DIAGNOSIS — N390 Urinary tract infection, site not specified: Secondary | ICD-10-CM | POA: Diagnosis present

## 2022-11-13 DIAGNOSIS — J90 Pleural effusion, not elsewhere classified: Secondary | ICD-10-CM | POA: Diagnosis present

## 2022-11-13 DIAGNOSIS — M84451A Pathological fracture, right femur, initial encounter for fracture: Secondary | ICD-10-CM | POA: Diagnosis present

## 2022-11-13 DIAGNOSIS — I2510 Atherosclerotic heart disease of native coronary artery without angina pectoris: Secondary | ICD-10-CM | POA: Diagnosis present

## 2022-11-13 DIAGNOSIS — Z90710 Acquired absence of both cervix and uterus: Secondary | ICD-10-CM

## 2022-11-13 DIAGNOSIS — Z79890 Hormone replacement therapy (postmenopausal): Secondary | ICD-10-CM

## 2022-11-13 DIAGNOSIS — R262 Difficulty in walking, not elsewhere classified: Secondary | ICD-10-CM | POA: Diagnosis present

## 2022-11-13 DIAGNOSIS — E785 Hyperlipidemia, unspecified: Secondary | ICD-10-CM | POA: Diagnosis present

## 2022-11-13 DIAGNOSIS — R410 Disorientation, unspecified: Secondary | ICD-10-CM | POA: Diagnosis not present

## 2022-11-13 DIAGNOSIS — I6782 Cerebral ischemia: Secondary | ICD-10-CM | POA: Diagnosis not present

## 2022-11-13 DIAGNOSIS — N3090 Cystitis, unspecified without hematuria: Secondary | ICD-10-CM | POA: Diagnosis not present

## 2022-11-13 DIAGNOSIS — Z9049 Acquired absence of other specified parts of digestive tract: Secondary | ICD-10-CM

## 2022-11-13 DIAGNOSIS — J9811 Atelectasis: Secondary | ICD-10-CM | POA: Diagnosis not present

## 2022-11-13 DIAGNOSIS — Z885 Allergy status to narcotic agent status: Secondary | ICD-10-CM

## 2022-11-13 DIAGNOSIS — S72001A Fracture of unspecified part of neck of right femur, initial encounter for closed fracture: Secondary | ICD-10-CM | POA: Diagnosis not present

## 2022-11-13 DIAGNOSIS — K5730 Diverticulosis of large intestine without perforation or abscess without bleeding: Secondary | ICD-10-CM | POA: Diagnosis not present

## 2022-11-13 DIAGNOSIS — N200 Calculus of kidney: Secondary | ICD-10-CM | POA: Diagnosis not present

## 2022-11-13 DIAGNOSIS — Z952 Presence of prosthetic heart valve: Secondary | ICD-10-CM

## 2022-11-13 DIAGNOSIS — B964 Proteus (mirabilis) (morganii) as the cause of diseases classified elsewhere: Secondary | ICD-10-CM | POA: Diagnosis present

## 2022-11-13 DIAGNOSIS — R531 Weakness: Secondary | ICD-10-CM

## 2022-11-13 DIAGNOSIS — F0390 Unspecified dementia without behavioral disturbance: Secondary | ICD-10-CM | POA: Diagnosis present

## 2022-11-13 DIAGNOSIS — R600 Localized edema: Secondary | ICD-10-CM | POA: Diagnosis not present

## 2022-11-13 DIAGNOSIS — M1611 Unilateral primary osteoarthritis, right hip: Secondary | ICD-10-CM | POA: Diagnosis not present

## 2022-11-13 DIAGNOSIS — Z66 Do not resuscitate: Secondary | ICD-10-CM | POA: Diagnosis present

## 2022-11-13 DIAGNOSIS — R4182 Altered mental status, unspecified: Secondary | ICD-10-CM | POA: Diagnosis not present

## 2022-11-13 DIAGNOSIS — Z7982 Long term (current) use of aspirin: Secondary | ICD-10-CM

## 2022-11-13 DIAGNOSIS — Z951 Presence of aortocoronary bypass graft: Secondary | ICD-10-CM

## 2022-11-13 DIAGNOSIS — I4891 Unspecified atrial fibrillation: Secondary | ICD-10-CM | POA: Diagnosis present

## 2022-11-13 LAB — URINALYSIS, ROUTINE W REFLEX MICROSCOPIC
Bilirubin Urine: NEGATIVE
Glucose, UA: NEGATIVE mg/dL
Hgb urine dipstick: NEGATIVE
Ketones, ur: 5 mg/dL — AB
Nitrite: NEGATIVE
Protein, ur: >=300 mg/dL — AB
Specific Gravity, Urine: 1.015 (ref 1.005–1.030)
Squamous Epithelial / HPF: NONE SEEN /HPF (ref 0–5)
WBC, UA: >50 WBC/hpf (ref 0–5)
pH: 7 (ref 5.0–8.0)

## 2022-11-13 LAB — COMPREHENSIVE METABOLIC PANEL
ALT: 12 U/L (ref 0–44)
AST: 22 U/L (ref 15–41)
Albumin: 3.1 g/dL — ABNORMAL LOW (ref 3.5–5.0)
Alkaline Phosphatase: 49 U/L (ref 38–126)
Anion gap: 6 (ref 5–15)
BUN: 19 mg/dL (ref 8–23)
CO2: 27 mmol/L (ref 22–32)
Calcium: 8.2 mg/dL — ABNORMAL LOW (ref 8.9–10.3)
Chloride: 103 mmol/L (ref 98–111)
Creatinine, Ser: 0.87 mg/dL (ref 0.44–1.00)
GFR, Estimated: >60 mL/min (ref >60)
Glucose, Bld: 111 mg/dL — ABNORMAL HIGH (ref 70–99)
Potassium: 3.5 mmol/L (ref 3.5–5.1)
Sodium: 136 mmol/L (ref 135–145)
Total Bilirubin: 0.5 mg/dL (ref 0.3–1.2)
Total Protein: 6.6 g/dL (ref 6.5–8.1)

## 2022-11-13 LAB — CBC
HCT: 33.3 % — ABNORMAL LOW (ref 36.0–46.0)
Hemoglobin: 11 g/dL — ABNORMAL LOW (ref 12.0–15.0)
MCH: 29.3 pg (ref 26.0–34.0)
MCHC: 33 g/dL (ref 30.0–36.0)
MCV: 88.8 fL (ref 80.0–100.0)
Platelets: 193 10*3/uL (ref 150–400)
RBC: 3.75 MIL/uL — ABNORMAL LOW (ref 3.87–5.11)
RDW: 13.9 % (ref 11.5–15.5)
WBC: 6.1 10*3/uL (ref 4.0–10.5)
nRBC: 0 % (ref 0.0–0.2)

## 2022-11-13 LAB — PROTIME-INR
INR: 1.2 (ref 0.8–1.2)
Prothrombin Time: 14.9 seconds (ref 11.4–15.2)

## 2022-11-13 MED ORDER — SODIUM CHLORIDE 0.9 % IV BOLUS
1000.0000 mL | Freq: Once | INTRAVENOUS | Status: AC
  Administered 2022-11-13: 1000 mL via INTRAVENOUS

## 2022-11-13 MED ORDER — SODIUM CHLORIDE 0.9 % IV SOLN
2.0000 g | Freq: Once | INTRAVENOUS | Status: AC
  Administered 2022-11-13: 2 g via INTRAVENOUS
  Filled 2022-11-13: qty 20

## 2022-11-13 MED ORDER — IOHEXOL 300 MG/ML  SOLN
75.0000 mL | Freq: Once | INTRAMUSCULAR | Status: AC | PRN
  Administered 2022-11-13: 75 mL via INTRAVENOUS

## 2022-11-13 NOTE — ED Notes (Signed)
Cath completed with 75ml cloudy sediment return

## 2022-11-13 NOTE — ED Provider Notes (Signed)
[facility] Provider Note    Event Date/Time   First MD Initiated Contact with Patient 11/13/22 2132     (approximate)   History   Altered Mental Status   HPI  Maria Sanchez is a 89 y.o. female here with generalized weakness.  History provided primarily by the patient's daughter.  Per report, the patient has been increasingly weak throughout the day today.  She is normally able to walk with normal assistance but is require essentially 4 person assist.  She has not wanting to eat or drink over the last several days.  Her urine has been foul-smelling today.  No known recent falls.     Physical Exam   Triage Vital Signs: ED Triage Vitals  Encounter Vitals Group     BP 11/13/22 1549 (!) 160/82     Systolic BP Percentile --      Diastolic BP Percentile --      Pulse Rate 11/13/22 1549 86     Resp 11/13/22 1549 18     Temp 11/13/22 1549 98.3 F (36.8 C)     Temp Source 11/13/22 1549 Oral     SpO2 11/13/22 1549 96 %     Weight 11/13/22 1545 105 lb (47.6 kg)     Height 11/13/22 1545 5' (1.524 m)     Head Circumference --      Peak Flow --      Pain Score 11/13/22 1545 0     Pain Loc --      Pain Education --      Exclude from Growth Chart --     Most recent vital signs: Vitals:   11/13/22 1549 11/13/22 2234  BP: (!) 160/82 (!) 155/83  Pulse: 86 60  Resp: 18 18  Temp: 98.3 F (36.8 C)   SpO2: 96% 98%     General: Awake, no distress.  CV:  Good peripheral perfusion.  Regular rate and rhythm. Resp:  Normal work of breathing.  Lungs clear. Abd:  No distention.  Other:  Mildly dry mucous membranes.   ED Results / Procedures / Treatments   Labs (all labs ordered are listed, but only abnormal results are displayed) Labs Reviewed  COMPREHENSIVE METABOLIC PANEL - Abnormal; Notable for the following components:      Result Value   Glucose, Bld 111 (*)    Calcium 8.2 (*)    Albumin 3.1 (*)    All other components within normal limits   CBC - Abnormal; Notable for the following components:   RBC 3.75 (*)    Hemoglobin 11.0 (*)    HCT 33.3 (*)    All other components within normal limits  URINALYSIS, ROUTINE W REFLEX MICROSCOPIC - Abnormal; Notable for the following components:   Color, Urine YELLOW (*)    APPearance TURBID (*)    Ketones, ur 5 (*)    Protein, ur >=300 (*)    Leukocytes,Ua MODERATE (*)    Bacteria, UA MANY (*)    All other components within normal limits  PROTIME-INR  TROPONIN I (HIGH SENSITIVITY)     EKG Atrial fibrillation, ventricular rate 94.  QRS 134, QTc 520.  No acute ST elevation or depression EKG evidence of acute ischemia or infarct.   RADIOLOGY CT A/P: Cystitis, small volume pleural effusion, moderate stool burden noted CXR: Left pleural effusion CT Head: NAICA   I also independently reviewed and agree with radiologist interpretations.   PROCEDURES:  Critical Care performed: No  MEDICATIONS ORDERED IN ED: Medications  sodium chloride 0.9 % bolus 1,000 mL (1,000 mLs Intravenous New Bag/Given 11/13/22 2245)  iohexol (OMNIPAQUE) 300 MG/ML solution 75 mL (75 mLs Intravenous Contrast Given 11/13/22 2254)  cefTRIAXone (ROCEPHIN) 2 g in sodium chloride 0.9 % 100 mL IVPB (2 g Intravenous New Bag/Given 11/13/22 2309)     IMPRESSION / MDM / ASSESSMENT AND PLAN / ED COURSE  I reviewed the triage vital signs and the nursing notes.                              Differential diagnosis includes, but is not limited to, generalized weakness from UTI, PNA, anemia, dehydration, polypharmacy, intra-abd emergency  Patient's presentation is most consistent with acute presentation with potential threat to life or bodily function.   The patient is on the cardiac monitor to evaluate for evidence of arrhythmia and/or significant heart rate changes   89 year old female with history of hypertension, mild dementia, hyperlipidemia, diabetes, here with generalized weakness.  Lab work shows likely  UTI with dehydration.  No focal neurological deficits.  CT scan read still pending but shows significant constipation on my assessment which could explain her decreased p.o. intake.  Patient remains weaker than usual.  Will admit for IV fluids and antibiotics.   FINAL CLINICAL IMPRESSION(S) / ED DIAGNOSES   Final diagnoses:  None     Rx / DC Orders   ED Discharge Orders     None        Note:  This document was prepared using Dragon voice recognition software and may include unintentional dictation errors.   Shaune Pollack, MD 11/15/22 [phone]

## 2022-11-13 NOTE — ED Triage Notes (Addendum)
Daughter states pt with weakness.  Pt dropped to the floor after going to the bathroom this am at 8am   hx dementia.  Pt lives at home with a sitter.  Pt denies h/a, chest pain or sob.  Pt alert.  Speech clear.   Daughter states similar episode happened at 1500 today also.

## 2022-11-13 NOTE — ED Notes (Signed)
Per dr Derrill Kay, ct scan only/protocols at this time, no code stroke at this time.

## 2022-11-13 NOTE — ED Notes (Addendum)
Daughter to STAT desk upset over wait time; st "it's because she is 89 years old, that's why no one cares! That's why yall ain't doing anything! We done seen 50 people come and go!"; assured daughter that pt's age had nothing to do with her wait time delay and that she will be going to next available exam room; explained difference in flex care and major care and reasoning why she may see pts go before her

## 2022-11-14 ENCOUNTER — Observation Stay: Payer: Medicare Other

## 2022-11-14 DIAGNOSIS — R531 Weakness: Secondary | ICD-10-CM | POA: Diagnosis present

## 2022-11-14 DIAGNOSIS — Z951 Presence of aortocoronary bypass graft: Secondary | ICD-10-CM | POA: Diagnosis not present

## 2022-11-14 DIAGNOSIS — N390 Urinary tract infection, site not specified: Secondary | ICD-10-CM | POA: Diagnosis present

## 2022-11-14 DIAGNOSIS — Z66 Do not resuscitate: Secondary | ICD-10-CM | POA: Diagnosis present

## 2022-11-14 DIAGNOSIS — Z90710 Acquired absence of both cervix and uterus: Secondary | ICD-10-CM | POA: Diagnosis not present

## 2022-11-14 DIAGNOSIS — I10 Essential (primary) hypertension: Secondary | ICD-10-CM | POA: Diagnosis present

## 2022-11-14 DIAGNOSIS — R600 Localized edema: Secondary | ICD-10-CM | POA: Diagnosis not present

## 2022-11-14 DIAGNOSIS — E785 Hyperlipidemia, unspecified: Secondary | ICD-10-CM | POA: Diagnosis present

## 2022-11-14 DIAGNOSIS — B964 Proteus (mirabilis) (morganii) as the cause of diseases classified elsewhere: Secondary | ICD-10-CM | POA: Diagnosis present

## 2022-11-14 DIAGNOSIS — Z79899 Other long term (current) drug therapy: Secondary | ICD-10-CM | POA: Diagnosis not present

## 2022-11-14 DIAGNOSIS — Z885 Allergy status to narcotic agent status: Secondary | ICD-10-CM | POA: Diagnosis not present

## 2022-11-14 DIAGNOSIS — F0390 Unspecified dementia without behavioral disturbance: Secondary | ICD-10-CM | POA: Diagnosis present

## 2022-11-14 DIAGNOSIS — Z952 Presence of prosthetic heart valve: Secondary | ICD-10-CM | POA: Diagnosis not present

## 2022-11-14 DIAGNOSIS — M84451A Pathological fracture, right femur, initial encounter for fracture: Secondary | ICD-10-CM | POA: Diagnosis present

## 2022-11-14 DIAGNOSIS — R262 Difficulty in walking, not elsewhere classified: Secondary | ICD-10-CM | POA: Diagnosis present

## 2022-11-14 DIAGNOSIS — E039 Hypothyroidism, unspecified: Secondary | ICD-10-CM | POA: Diagnosis present

## 2022-11-14 DIAGNOSIS — I2510 Atherosclerotic heart disease of native coronary artery without angina pectoris: Secondary | ICD-10-CM | POA: Diagnosis present

## 2022-11-14 DIAGNOSIS — M1611 Unilateral primary osteoarthritis, right hip: Secondary | ICD-10-CM | POA: Diagnosis not present

## 2022-11-14 DIAGNOSIS — I4891 Unspecified atrial fibrillation: Secondary | ICD-10-CM | POA: Diagnosis present

## 2022-11-14 DIAGNOSIS — J90 Pleural effusion, not elsewhere classified: Secondary | ICD-10-CM | POA: Diagnosis present

## 2022-11-14 DIAGNOSIS — Z9049 Acquired absence of other specified parts of digestive tract: Secondary | ICD-10-CM | POA: Diagnosis not present

## 2022-11-14 DIAGNOSIS — S72001A Fracture of unspecified part of neck of right femur, initial encounter for closed fracture: Secondary | ICD-10-CM | POA: Diagnosis not present

## 2022-11-14 DIAGNOSIS — Z79890 Hormone replacement therapy (postmenopausal): Secondary | ICD-10-CM | POA: Diagnosis not present

## 2022-11-14 DIAGNOSIS — Z7982 Long term (current) use of aspirin: Secondary | ICD-10-CM | POA: Diagnosis not present

## 2022-11-14 LAB — CREATININE, SERUM
Creatinine, Ser: 0.79 mg/dL (ref 0.44–1.00)
GFR, Estimated: >60 mL/min (ref >60)

## 2022-11-14 LAB — CBC
HCT: 33.9 % — ABNORMAL LOW (ref 36.0–46.0)
Hemoglobin: 11.1 g/dL — ABNORMAL LOW (ref 12.0–15.0)
MCH: 29.7 pg (ref 26.0–34.0)
MCHC: 32.7 g/dL (ref 30.0–36.0)
MCV: 90.6 fL (ref 80.0–100.0)
Platelets: 173 10*3/uL (ref 150–400)
RBC: 3.74 MIL/uL — ABNORMAL LOW (ref 3.87–5.11)
RDW: 13.7 % (ref 11.5–15.5)
WBC: 5.3 10*3/uL (ref 4.0–10.5)
nRBC: 0 % (ref 0.0–0.2)

## 2022-11-14 LAB — TROPONIN I (HIGH SENSITIVITY): Troponin I (High Sensitivity): 47 ng/L — ABNORMAL HIGH (ref <18)

## 2022-11-14 MED ORDER — ONDANSETRON HCL 4 MG PO TABS: 4.0000 mg | ORAL_TABLET | Freq: Four times a day (QID) | ORAL | Status: DC | PRN

## 2022-11-14 MED ORDER — ENOXAPARIN SODIUM 40 MG/0.4ML IJ SOSY
40.0000 mg | PREFILLED_SYRINGE | INTRAMUSCULAR | Status: DC
  Administered 2022-11-14 – 2022-11-16 (×3): 40 mg via SUBCUTANEOUS
  Filled 2022-11-14 (×2): qty 0.4

## 2022-11-14 MED ORDER — ONDANSETRON HCL 4 MG/2ML IJ SOLN: 4.0000 mg | Freq: Four times a day (QID) | INTRAMUSCULAR | Status: DC | PRN

## 2022-11-14 MED ORDER — HYDRALAZINE HCL 25 MG PO TABS
25.0000 mg | ORAL_TABLET | Freq: Three times a day (TID) | ORAL | Status: DC
  Administered 2022-11-14 – 2022-11-16 (×5): 25 mg via ORAL
  Filled 2022-11-14 (×5): qty 1

## 2022-11-14 MED ORDER — ACETAMINOPHEN 650 MG RE SUPP: 650.0000 mg | Freq: Four times a day (QID) | RECTAL | Status: DC | PRN

## 2022-11-14 MED ORDER — AMLODIPINE BESYLATE 5 MG PO TABS
5.0000 mg | ORAL_TABLET | Freq: Every day | ORAL | Status: DC
  Administered 2022-11-14 – 2022-11-16 (×3): 5 mg via ORAL
  Filled 2022-11-14 (×2): qty 1

## 2022-11-14 MED ORDER — HYDRALAZINE HCL 20 MG/ML IJ SOLN
10.0000 mg | Freq: Four times a day (QID) | INTRAMUSCULAR | Status: DC | PRN
  Administered 2022-11-14: 10 mg via INTRAVENOUS

## 2022-11-14 MED ORDER — ESCITALOPRAM OXALATE 10 MG PO TABS
10.0000 mg | ORAL_TABLET | Freq: Every day | ORAL | Status: DC
  Administered 2022-11-14 – 2022-11-16 (×3): 10 mg via ORAL
  Filled 2022-11-14 (×2): qty 1

## 2022-11-14 MED ORDER — SODIUM CHLORIDE 0.9 % IV SOLN
1.0000 g | INTRAVENOUS | Status: DC
  Administered 2022-11-14 – 2022-11-15 (×2): 1 g via INTRAVENOUS
  Filled 2022-11-14 (×3): qty 10

## 2022-11-14 MED ORDER — LACTATED RINGERS IV SOLN: INTRAVENOUS | Status: AC

## 2022-11-14 MED ORDER — HYDRALAZINE HCL 20 MG/ML IJ SOLN
INTRAMUSCULAR | Status: AC
  Administered 2022-11-14: 10 mg via INTRAVENOUS
  Filled 2022-11-14: qty 1

## 2022-11-14 MED ORDER — LEVOTHYROXINE SODIUM 50 MCG PO TABS
75.0000 ug | ORAL_TABLET | Freq: Every day | ORAL | Status: DC
  Administered 2022-11-15 – 2022-11-16 (×2): 75 ug via ORAL
  Filled 2022-11-14 (×2): qty 1

## 2022-11-14 MED ORDER — ACETAMINOPHEN 325 MG PO TABS: 650.0000 mg | ORAL_TABLET | Freq: Four times a day (QID) | ORAL | Status: DC | PRN

## 2022-11-14 MED ORDER — ESCITALOPRAM OXALATE 10 MG PO TABS
ORAL_TABLET | ORAL | Status: AC
  Filled 2022-11-14: qty 1

## 2022-11-14 MED ORDER — AMLODIPINE BESYLATE 5 MG PO TABS
ORAL_TABLET | ORAL | Status: AC
  Filled 2022-11-14: qty 1

## 2022-11-14 NOTE — Assessment & Plan Note (Signed)
Secondary to UTI Some concern for hip fracture, pelvis CT pending PT consult, TOC consult Treat UTI Follow-up pelvic CT

## 2022-11-14 NOTE — ED Notes (Signed)
Unit sec notified to arrange transport for the patient to the floor.

## 2022-11-14 NOTE — Assessment & Plan Note (Signed)
Rocephin ?Follow cultures ?

## 2022-11-14 NOTE — ED Notes (Signed)
Report received from [facility]. Patient care assumed. Patient/RN introduction complete. Will continue to monitor.

## 2022-11-14 NOTE — ED Notes (Signed)
Provided ice water per request. Family member complaining that BP is too high (currently 177/82). Tried to assure pt that elevated SBP is common in older adults. Family member replied "well not for her". Pharmacist is aware that they are ready for med req.

## 2022-11-14 NOTE — H&P (Signed)
History and Physical    Patient: Maria Sanchez VZD:[id number] DOB: 08/25/1930 DOA: 11/13/2022 DOS: the patient was seen and examined on 11/14/2022 PCP: Lyndon Code, MD  Patient coming from: Home  Chief Complaint:  Chief Complaint  Patient presents with   Altered Mental Status    HPI: Maria Sanchez is a 89 y.o. female with medical history significant for CAD, HTN, dementia who presents to the ED with a few day history of weakness and decreased oral intake.  On the day of arrival, the patient was at baseline ambulates independently, needed 4 person assist.  She has had no falls.  Family noticed the urine to be foul-smelling.   ED course and data reviewed:Vitals within normal limits except for slightly elevated blood pressure.  Labs notable for normal CBC, however with abnormal urinalysis with moderate leukocyte esterase and many bacteria.  CMP and CBC otherwise unremarkable except for hemoglobin of 11 which is about baseline. EKG, personally viewed and interpreted showing A-fib with no prior history of same CT abdomen and pelvis with contrast notable for cystitis and a chronic appearing impacted left femoral neck fracture as further detailed below: IMPRESSION: 1. At least small volume pleural effusion. 2. Cystitis 3. Nonobstructive left nephrolithiasis measuring up to 2 mm. 4. Atrophic right kidney. 5. Colonic diverticulosis with no acute diverticulitis. 6. Aortic Atherosclerosis (ICD10-I70.0). Four-vessel coronary calcification. Severe mitral annular calcification. At least moderate aortic valve leaflet calcification. 7. Age-indeterminate, likely chronic, impacted right femoral neck fracture. If concern for acute traumatic injury recommend dedicated radiograph of the right hip.     Chest x-ray nonacute Head CT with chronic findings CT hip pending   Patient treated with an IV fluid bolus and started on Rocephin Hospitalist consulted for admission.      Past Medical History:   Diagnosis Date   Arthritis    CAD (coronary artery disease)    Dementia (HCC)    Hyperlipidemia    Hypertension    Renal artery stenosis (HCC)    Thyroid disease    Past Surgical History:  Procedure Laterality Date   ABDOMINAL HYSTERECTOMY     BREAST SURGERY     benign   CHOLECYSTECTOMY     CORONARY ARTERY BYPASS GRAFT     exicision salivary gland     MITRAL VALVE REPLACEMENT (MVR)/CORONARY ARTERY BYPASS GRAFTING (CABG)     posterolateral fusion     stent placed in kidney Right    thumb arthroplasty  06/20/2005   Social History:  reports that she has never smoked. She has never used smokeless tobacco. She reports that she does not drink alcohol and does not use drugs.  Allergies  Allergen Reactions   Morphine And Codeine Other (See Comments)    "makes me crazy"   Requip [Ropinirole Hcl]     Family History  Problem Relation Age of Onset   Colon cancer Mother    Breast cancer Sister    Colon cancer Brother     Prior to Admission medications   Medication Sig Start Date End Date Taking? Authorizing Provider  allopurinol (ZYLOPRIM) 100 MG tablet TAKE 2 TABLETS BY MOUTH AT BEDTIME AS NEEDED FOR GOUT 02/11/22   Lyndon Code, MD  amLODipine (NORVASC) 5 MG tablet TAKE (1) TABLET BY MOUTH EVERY DAY 08/25/22   Lyndon Code, MD  aspirin EC 81 MG tablet Take 81 mg daily by mouth.    [provider]  cefUROXime (CEFTIN) 250 MG tablet Take 1 tablet (250 mg  total) by mouth 2 (two) times daily with a meal. 06/14/21   Stoioff, Verna Czech, MD  escitalopram (LEXAPRO) 10 MG tablet TAKE (1) TABLET BY MOUTH EVERY DAY WITH SUPPER 08/25/22   Lyndon Code, MD  hydrALAZINE (APRESOLINE) 25 MG tablet TAKE 1 TABLET BY MOUTH 3 TIMES A DAY FOR BLOOD PRESSURE 02/11/22   Lyndon Code, MD  levothyroxine (SYNTHROID) 75 MCG tablet TAKE (1) TABLET BY MOUTH EVERY DAY 07/24/22   Lyndon Code, MD  lidocaine (LIDODERM) 5 % Place 1 patch onto the skin daily. Remove & Discard patch within 12 hours or as  directed by MD 09/13/21   Lyndon Code, MD  nitrofurantoin, macrocrystal-monohydrate, (MACROBID) 100 MG capsule Take 100 mg by mouth 2 (two) times daily.    [provider]  pravastatin (PRAVACHOL) 10 MG tablet Take 1 tablet (10 mg total) by mouth at bedtime. 02/11/22   Lyndon Code, MD  risperiDONE (RISPERDAL) 0.5 MG tablet Take 1 tablet (0.5 mg total) by mouth 2 (two) times daily. Takes one tablet in the morning and one tablet at night 05/29/21 06/28/21  Lyndon Code, MD    Physical Exam: Vitals:   11/13/22 1545 11/13/22 1549 11/13/22 2234  BP:  (!) 160/82 (!) 155/83  Pulse:  86 60  Resp:  18 18  Temp:  98.3 F (36.8 C)   TempSrc:  Oral   SpO2:  96% 98%  Weight: 47.6 kg    Height: 5' (1.524 m)     Physical Exam Vitals and nursing note reviewed.  Constitutional:      General: She is not in acute distress.    Comments: Frail appearing  HENT:     Head: Normocephalic and atraumatic.  Cardiovascular:     Rate and Rhythm: Normal rate and regular rhythm.     Heart sounds: Normal heart sounds.  Pulmonary:     Effort: Pulmonary effort is normal.     Breath sounds: Normal breath sounds.  Abdominal:     Palpations: Abdomen is soft.     Tenderness: There is no abdominal tenderness.  Neurological:     Mental Status: Mental status is at baseline.     Labs on Admission: I have personally reviewed following labs and imaging studies  CBC: Recent Labs  Lab 11/13/22 1550  WBC 6.1  HGB 11.0*  HCT 33.3*  MCV 88.8  PLT 193   Basic Metabolic Panel: Recent Labs  Lab 11/13/22 1550  NA 136  K 3.5  CL 103  CO2 27  GLUCOSE 111*  BUN 19  CREATININE 0.87  CALCIUM 8.2*   GFR: Estimated Creatinine Clearance: 31.5 mL/min (by C-G formula based on SCr of 0.87 mg/dL). Liver Function Tests: Recent Labs  Lab 11/13/22 1550  AST 22  ALT 12  ALKPHOS 49  BILITOT 0.5  PROT 6.6  ALBUMIN 3.1*   No results for input(s): "LIPASE", "AMYLASE" in the last 168 hours. No results  for input(s): "AMMONIA" in the last 168 hours. Coagulation Profile: Recent Labs  Lab 11/13/22 1550  INR 1.2   Cardiac Enzymes: No results for input(s): "CKTOTAL", "CKMB", "CKMBINDEX", "TROPONINI" in the last 168 hours. BNP (last 3 results) No results for input(s): "PROBNP" in the last 8760 hours. HbA1C: No results for input(s): "HGBA1C" in the last 72 hours. CBG: No results for input(s): "GLUCAP" in the last 168 hours. Lipid Profile: No results for input(s): "CHOL", "HDL", "LDLCALC", "TRIG", "CHOLHDL", "LDLDIRECT" in the last 72 hours. Thyroid Function Tests:  No results for input(s): "TSH", "T4TOTAL", "FREET4", "T3FREE", "THYROIDAB" in the last 72 hours. Anemia Panel: No results for input(s): "VITAMINB12", "FOLATE", "FERRITIN", "TIBC", "IRON", "RETICCTPCT" in the last 72 hours. Urine analysis:    Component Value Date/Time   COLORURINE YELLOW (A) 11/13/2022 2230   APPEARANCEUR TURBID (A) 11/13/2022 2230   APPEARANCEUR Cloudy (A) 06/14/2021 1348   LABSPEC 1.015 11/13/2022 2230   PHURINE 7.0 11/13/2022 2230   GLUCOSEU NEGATIVE 11/13/2022 2230   HGBUR NEGATIVE 11/13/2022 2230   BILIRUBINUR NEGATIVE 11/13/2022 2230   BILIRUBINUR Negative 06/14/2021 1348   KETONESUR 5 (A) 11/13/2022 2230   PROTEINUR >=300 (A) 11/13/2022 2230   UROBILINOGEN 0.2 08/07/2020 0937   NITRITE NEGATIVE 11/13/2022 2230   LEUKOCYTESUR MODERATE (A) 11/13/2022 2230    Radiological Exams on Admission: CT ABDOMEN PELVIS W CONTRAST  Result Date: 11/13/2022 CLINICAL DATA:  Abdominal pain, acute, nonlocalized EXAM: CT ABDOMEN AND PELVIS WITH CONTRAST TECHNIQUE: Multidetector CT imaging of the abdomen and pelvis was performed using the standard protocol following bolus administration of intravenous contrast. RADIATION DOSE REDUCTION: This exam was performed according to the departmental dose-optimization program which includes automated exposure control, adjustment of the mA and/or kV according to patient size  and/or use of iterative reconstruction technique. CONTRAST:  75mL OMNIPAQUE IOHEXOL 300 MG/ML  SOLN COMPARISON:  CT abdomen pelvis 08/18/2007 FINDINGS: Lower chest: Four-vessel coronary calcification. Severe mitral annular calcification. At least moderate aortic valve leaflet calcification. Atherosclerotic plaque. At least small volume left pleural effusion. Hepatobiliary: Nonspecific punctate calcification. No focal liver abnormality. Status post cholecystectomy. No biliary dilatation. Pancreas: Diffusely atrophic. No focal lesion. Otherwise normal pancreatic contour. No surrounding inflammatory changes. No main pancreatic ductal dilatation. Spleen: Normal in size without focal abnormality. Adrenals/Urinary Tract: No adrenal nodule bilaterally. Atrophic right kidney. Bilateral kidneys enhance symmetrically. Fluid density lesion within left kidney likely represents a simple renal cyst. Simple renal cysts, in the absence of clinically indicated signs/symptoms, require no independent follow-up. Left nephrolithiasis measuring up to 2 mm. No hydronephrosis. No hydroureter. Circumferential urinary bladder wall thickening with perivesicular fat stranding. On delayed imaging, there is no urothelial wall thickening and there are no filling defects in the opacified portions of the bilateral collecting systems or ureters. Stomach/Bowel: Stomach is within normal limits. No evidence of bowel wall thickening or dilatation. Colonic diverticulosis. Stool within the rectum. Appendix appears normal. Vascular/Lymphatic: No abdominal aorta or iliac aneurysm. Severe atherosclerotic plaque of the aorta and its branches. No abdominal, pelvic, or inguinal lymphadenopathy. Reproductive: Status post hysterectomy. No adnexal masses. Other: No intraperitoneal free fluid. No intraperitoneal free gas. No organized fluid collection. Musculoskeletal: No abdominal wall hernia or abnormality. Diffusely decreased bone density. Diffusely decreased  bone density. No suspicious lytic or blastic osseous lesions. Grade 1 anterolisthesis of L4 on L5. Chronic superior endplate L2 vertebral body height loss with large Schmorl node. Age-indeterminate, likely chronic, impacted right femoral neck fracture. Bilateral hand carpal bone degenerative changes. IMPRESSION: 1. At least small volume pleural effusion. 2. Cystitis 3. Nonobstructive left nephrolithiasis measuring up to 2 mm. 4. Atrophic right kidney. 5. Colonic diverticulosis with no acute diverticulitis. 6. Aortic Atherosclerosis (ICD10-I70.0). Four-vessel coronary calcification. Severe mitral annular calcification. At least moderate aortic valve leaflet calcification. 7. Age-indeterminate, likely chronic, impacted right femoral neck fracture. If concern for acute traumatic injury recommend dedicated radiograph of the right hip. Electronically Signed   By: Tish Frederickson M.D.   On: 11/13/2022 23:46   DG Chest 2 View  Result Date: 11/13/2022 CLINICAL DATA:  Generalized  weakness EXAM: CHEST - 2 VIEW COMPARISON:  05/26/2013 FINDINGS: Moderate left pleural effusion with associated left basilar compressive atelectasis. Right lung is clear. No pneumothorax. No pleural effusion on the right. Coronary artery bypass grafting has been performed. Cardiac size is within normal limits. Pulmonary vascularity is normal. No acute bone abnormality. IMPRESSION: 1. Moderate left pleural effusion with associated left basilar compressive atelectasis. Electronically Signed   By: Helyn Numbers M.D.   On: 11/13/2022 22:28   CT Head Wo Contrast  Result Date: 11/13/2022 CLINICAL DATA:  Mental status change, unknown cause EXAM: CT HEAD WITHOUT CONTRAST TECHNIQUE: Contiguous axial images were obtained from the base of the skull through the vertex without intravenous contrast. RADIATION DOSE REDUCTION: This exam was performed according to the departmental dose-optimization program which includes automated exposure control,  adjustment of the mA and/or kV according to patient size and/or use of iterative reconstruction technique. COMPARISON:  MRI head 01/24/2020. FINDINGS: Brain: No evidence of acute large vascular territory infarction, hemorrhage, hydrocephalus, extra-axial collection or mass lesion/mass effect. Patchy white matter hypodensities, nonspecific but compatible with chronic microvascular ischemic change. Vascular: No hyperdense vessel. Skull: No acute fracture. Sinuses/Orbits: No acute finding. Other: No mastoid effusions. IMPRESSION: 1. No evidence of acute intracranial abnormality. 2. Chronic microvascular ischemic change. Electronically Signed   By: Feliberto Harts M.D.   On: 11/13/2022 16:30     Data Reviewed: Relevant notes from primary care and specialist visits, past discharge summaries as available in EHR, including Care Everywhere. Prior diagnostic testing as pertinent to current admission diagnoses Updated medications and problem lists for reconciliation ED course, including vitals, labs, imaging, treatment and response to treatment Triage notes, nursing and pharmacy notes and ED provider's notes Notable results as noted in HPI   Assessment and Plan: * Generalized weakness Secondary to UTI Some concern for hip fracture, pelvis CT pending PT consult, TOC consult Treat UTI Follow-up pelvic CT  Urinary tract infection Rocephin Follow cultures  Ambulatory dysfunction Chronic impacted right femoral neck fracture on CT scan Dedicated CT right hip ordered as recommended by radiologist Patient does not appear to be in pain Follow-up CT result Consider Ortho consult  CAD (coronary artery disease) Continue pravastatin and aspirin.  Acquired hypothyroidism Continue levothyroxine  Essential hypertension Continue amlodipine and hydralazine        DVT prophylaxis: Lovenox  Consults: none  Advance Care Planning:   Code Status: Full Code   Family Communication:  none  Disposition Plan: Back to previous home environment  Severity of Illness: The appropriate patient status for this patient is OBSERVATION. Observation status is judged to be reasonable and necessary in order to provide the required intensity of service to ensure the patient's safety. The patient's presenting symptoms, physical exam findings, and initial radiographic and laboratory data in the context of their medical condition is felt to place them at decreased risk for further clinical deterioration. Furthermore, it is anticipated that the patient will be medically stable for discharge from the hospital within 2 midnights of admission.   Author: Andris Baumann, MD 11/14/2022 2:42 AM  For on call review [email or web address].

## 2022-11-14 NOTE — ED Notes (Signed)
Pt resting comfortably in bed. Pt family at bedside.

## 2022-11-14 NOTE — ED Notes (Signed)
First encounter with family of pt, family member came to nurse station demanding "why is she not in a room?!" As first words spoken to this RN. This RN followed her into room, helped reposition pt who is sleeping peacefully. Explained to her that nurses have no control over patients getting beds upstairs. Family member also complaining that pt has not eaten today and has not had dementia meds. Informed pharmacist that family is ready to do med req now. Family going to feed pt breakfast.

## 2022-11-14 NOTE — Assessment & Plan Note (Signed)
-   Continue amlodipine and hydralazine 

## 2022-11-14 NOTE — Assessment & Plan Note (Signed)
Continue levothyroxine 

## 2022-11-14 NOTE — Assessment & Plan Note (Signed)
Chronic impacted right femoral neck fracture on CT scan Dedicated CT right hip ordered as recommended by radiologist Patient does not appear to be in pain Follow-up CT result Consider Ortho consult

## 2022-11-14 NOTE — TOC Initial Note (Signed)
Transition of Care [facility]) - Initial/Assessment Note    Patient Details  Name: Maria Sanchez MRN: [id number] Date of Birth: 1930-05-03  [facility]) CM/SW Contact:    Kreg Shropshire, RN Phone Number: 11/14/2022, 3:32 PM  Clinical Narrative:                 CM received PT/OT rec for SNF. According to paid caregiver. Caregiver stated that she lives with pt and she has 24/7 caregiver  help at home. She advised I talk with daughter. Cm spoke with daughter Olegario Messier and she stated she would rather her mother go home with [facility] PT instead of SNF. Daughter does not have any preference with companies.   DME at home includes walker and wheelchair. At pt baseline per daughter she was able to walk without walker and wheelchair. PCP is Dr. Welton Flakes and no problems with medications.     Barriers to Discharge: Continued Medical Work up   Patient Goals and CMS Choice     Choice offered to / list presented to : Parent      Expected Discharge Plan and Services     Post Acute Care Choice: Home Health Living arrangements for the past 2 months: Single Family Home                                      Prior Living Arrangements/Services Living arrangements for the past 2 months: Single Family Home Lives with:: Self, Other (Comment) (hired caregivers)   Do you feel safe going back to the place where you live?: Yes      Need for Family Participation in Patient Care: Yes (Comment) Care giver support system in place?: Yes (comment) Current home services: DME    Activities of Daily Living      Permission Sought/Granted                  Emotional Assessment Appearance:: Appears stated age Attitude/Demeanor/Rapport: Engaged Affect (typically observed): Calm        Admission diagnosis:  Generalized weakness [R53.1] Weakness [R53.1] Patient Active Problem List   Diagnosis Date Noted   Generalized weakness 11/14/2022   Ambulatory dysfunction 11/14/2022   Weakness  11/14/2022   Loss of memory 03/01/2020   Cerebrovascular small vessel disease 02/19/2020   Abnormal weight loss 02/04/2020   Needs flu shot 02/04/2020   Dizziness of unknown cause 01/05/2020   Disorientation 01/05/2020   Depression, major, single episode, in partial remission (HCC) 11/20/2019   Anxiety disorder 05/25/2019   Insomnia due to medical condition 05/25/2019   Chronic gout without tophus 01/23/2019   LVH (left ventricular hypertrophy) due to hypertensive disease, without heart failure 12/14/2018   CAD (coronary artery disease) 09/02/2018   Urinary tract infection 07/14/2018   Leukocytosis 07/14/2018   Dysuria 07/14/2018   Bilateral carotid artery stenosis 01/21/2018   Encounter for general adult medical examination with abnormal findings 01/20/2018   B12 deficiency 09/03/2017   Stage 3 chronic kidney disease (HCC) 04/22/2017   Vitamin B12 deficiency anemia, unspecified 04/22/2017   Dementia in other diseases classified elsewhere with behavioral disturbance 04/22/2017   Auditory hallucination 04/22/2017   Essential hypertension 04/22/2017   Restless leg syndrome 04/22/2017   Nonalcoholic steatohepatitis (NASH) 04/22/2017   Allergic rhinitis 04/22/2017   Peripheral vascular disease (HCC) 04/22/2017   Vitamin D deficiency 04/22/2017   Lumbago with sciatica, right side 04/22/2017  Occlusion and stenosis of bilateral carotid arteries 04/22/2017   Chronic ischemic heart disease 04/22/2017   Acquired hypothyroidism 04/22/2017   Primary generalized (osteo)arthritis 04/22/2017   Atherosclerotic heart disease of native coronary artery without angina pectoris 04/22/2017   Renovascular hypertension 04/22/2017   Moderate tricuspid insufficiency 12/14/2014   Mixed hyperlipidemia 11/16/2013   PCP:  Lyndon Code, MD Pharmacy:   [facility], [place] - [address] [place] [address] Phone: [phone] Fax: [phone]     Social Determinants of  Health (SDOH) Social History: SDOH Screenings   Alcohol Screen: Low Risk  (07/30/2021)  Depression (PHQ2-9): Low Risk  (09/16/2022)  Tobacco Use: Low Risk  (11/13/2022)   SDOH Interventions:     Readmission Risk Interventions     No data to display

## 2022-11-14 NOTE — ED Notes (Signed)
Pt sleeping at this time. Chest rise and fall noted

## 2022-11-14 NOTE — Progress Notes (Signed)
   11/14/22 1600  Spiritual Encounters  Type of Visit Initial  Care provided to: Family  Referral source Family  Reason for visit Advance directives  OnCall Visit No  Advance Directives (For Healthcare)  Does Patient Have a Medical Advance Directive? Yes  Does patient want to make changes to medical advance directive? No - Guardian declined  Type of Advance Directive Healthcare Power of [employer] of Healthcare Power of Attorney in Chart? No - copy requested  Mental Health Advance Directives  Does Patient Have a Mental Health Advance Directive? No  Would patient like information on creating a mental health advance directive? No - Guardian declined   Chaplain responded to request for advanced directive.  Spoke with nurse and family. Family stated HCPOA is compete and is now requesting DNR.  Family and nurse made aware the DNR can only be completed by the physician. Chaplain spiritual support services remain available as the need arises.

## 2022-11-14 NOTE — Assessment & Plan Note (Signed)
Continue pravastatin and aspirin.

## 2022-11-14 NOTE — Evaluation (Signed)
Physical Therapy Evaluation Patient Details Name: Maria Sanchez MRN: [id number] DOB: 1930/06/09 Today's Date: 11/14/2022  History of Present Illness  Pt is an 89 y/o F admitted on 11/13/22 after presenting with a few day hx of weakness & decreased oral intake. CT abdomen and pelvis with contrast notable for cystitis and a chronic appearing impacted left femoral neck fracture. Pt being treated for UTI. PMH: CAD, HTN, dementia, arthritis, HLD  Clinical Impression  Pt seen for PT evaluation with pt's caregiver [place]) present for session. [place] Lions reports pt requires assistance for all mobility & has caregivers 24 hrs/day. On this date, pt presents with decreased activity tolerance, decreased cognition, requires total assist to transfer supine<>sit & +2 to scoot to [facility]. PT assisted pt with changing into clean gown total assist. Recommend ongoing PT services to maximize independence with bed mobility, transfers, & gait as [place] Lions reports pt was able to perform all of these tasks with 1 assist prior to admission.        Assistance Recommended at Discharge Frequent or constant Supervision/Assistance  If plan is discharge home, recommend the following:  Can travel by private vehicle  Two people to help with walking and/or transfers;Two people to help with bathing/dressing/bathroom;Help with stairs or ramp for entrance;Direct supervision/assist for medications management;Assistance with feeding;Assist for transportation;Assistance with cooking/housework;Direct supervision/assist for financial management   No    Equipment Recommendations None recommended by PT  Recommendations for Other Services       Functional Status Assessment Patient has had a recent decline in their functional status and demonstrates the ability to make significant improvements in function in a reasonable and predictable amount of time.     Precautions / Restrictions Precautions Precautions: Fall Restrictions Weight Bearing  Restrictions: Yes LLE Weight Bearing: Weight bearing as tolerated (with RW, per secure chat with Dr. Hyacinth Meeker (ortho))      Mobility  Bed Mobility Overal bed mobility: Needs Assistance Bed Mobility: Supine to Sit, Sit to Supine     Supine to sit: Total assist, HOB elevated Sit to supine: Total assist, HOB elevated   General bed mobility comments: +2 to scoot to [facility]    Transfers                        Ambulation/Gait                  Stairs            Wheelchair Mobility     Tilt Bed    Modified Rankin (Stroke Patients Only)       Balance Overall balance assessment: Needs assistance Sitting-balance support: Feet unsupported, No upper extremity supported Sitting balance-Leahy Scale: Zero Sitting balance - Comments: Poor awareness of L lean/LOB with max<>total assist to correct. Postural control: Left lateral lean                                   Pertinent Vitals/Pain Pain Assessment Pain Assessment: Faces Faces Pain Scale: Hurts little more Pain Location: generalized Pain Descriptors / Indicators: Grimacing, Discomfort Pain Intervention(s): Monitored during session, Limited activity within patient's tolerance, Repositioned    Home Living Family/patient expects to be discharged to:: Private residence Living Arrangements: Alone Available Help at Discharge: Personal care attendant;Available 24 hours/day Type of Home: House Home Access:  (threshold entrance?)       Home Layout: One level Home Equipment: Rollator (4 wheels)  Prior Function               Mobility Comments: Personal care aide provides assistance for bed mobility, transfers, gait. Pt has had 3 falls in the past week. ADLs Comments: Personal care aide provides assistance for toileting, bathing, dressing.     Hand Dominance        Extremity/Trunk Assessment   Upper Extremity Assessment Upper Extremity Assessment: Generalized weakness     Lower Extremity Assessment Lower Extremity Assessment: Generalized weakness (BLE hips/knees flexed, requires assistance to extend them in supine but then returns to having them slightly flexed)    Cervical / Trunk Assessment Cervical / Trunk Assessment: Kyphotic  Communication   Communication: HOH (has hearing aides but not in place during session)  Cognition Arousal/Alertness: Awake/alert   Overall Cognitive Status: Difficult to assess                                 General Comments: Pt does not follow any simple commands throughout session, doesn't answer questions, unsure if this is solely 2/2 HOH or also due to cognitive deficits.        General Comments      Exercises     Assessment/Plan    PT Assessment Patient needs continued PT services  PT Problem List Decreased strength;Decreased cognition;Decreased activity tolerance;Decreased balance;Decreased mobility;Decreased safety awareness;Decreased knowledge of use of DME;Decreased knowledge of precautions       PT Treatment Interventions DME instruction;Therapeutic exercise;Gait training;Balance training;Stair training;Neuromuscular re-education;Functional mobility training;Therapeutic activities;Patient/family education;Cognitive remediation;Modalities    PT Goals (Current goals can be found in the Care Plan section)  Acute Rehab PT Goals PT Goal Formulation: Patient unable to participate in goal setting Time For Goal Achievement: 11/28/22 Potential to Achieve Goals: Fair    Frequency Min 1X/week     Co-evaluation               AM-PAC PT "6 Clicks" Mobility  Outcome Measure Help needed turning from your back to your side while in a flat bed without using bedrails?: Total Help needed moving from lying on your back to sitting on the side of a flat bed without using bedrails?: Total Help needed moving to and from a bed to a chair (including a wheelchair)?: Total Help needed standing up from a  chair using your arms (e.g., wheelchair or bedside chair)?: Total Help needed to walk in hospital room?: Total Help needed climbing 3-5 steps with a railing? : Total 6 Click Score: 6    End of Session   Activity Tolerance: Patient tolerated treatment well Patient left: in bed;with call bell/phone within reach;with bed alarm set;with family/visitor present (caregiver [place]) in room)   PT Visit Diagnosis: Muscle weakness (generalized) (M62.81);Difficulty in walking, not elsewhere classified (R26.2);Other abnormalities of gait and mobility (R26.89);History of falling (Z91.81)    Time: 1610-9604 PT Time Calculation (min) (ACUTE ONLY): 11 min   Charges:   PT Evaluation $PT Eval Moderate Complexity: 1 Mod   PT General Charges $$ ACUTE PT VISIT: 1 Visit         Aleda Grana, PT, DPT 11/14/22, 12:09 PM   Sandi Mariscal 11/14/2022, 12:08 PM

## 2022-11-15 DIAGNOSIS — R531 Weakness: Secondary | ICD-10-CM | POA: Diagnosis not present

## 2022-11-15 LAB — BASIC METABOLIC PANEL
Anion gap: 8 (ref 5–15)
BUN: 16 mg/dL (ref 8–23)
CO2: 27 mmol/L (ref 22–32)
Calcium: 8.3 mg/dL — ABNORMAL LOW (ref 8.9–10.3)
Chloride: 102 mmol/L (ref 98–111)
Creatinine, Ser: 0.74 mg/dL (ref 0.44–1.00)
GFR, Estimated: >60 mL/min (ref >60)
Glucose, Bld: 95 mg/dL (ref 70–99)
Potassium: 3.6 mmol/L (ref 3.5–5.1)
Sodium: 137 mmol/L (ref 135–145)

## 2022-11-15 LAB — URINE CULTURE

## 2022-11-15 LAB — CBC
HCT: 34.5 % — ABNORMAL LOW (ref 36.0–46.0)
Hemoglobin: 11.6 g/dL — ABNORMAL LOW (ref 12.0–15.0)
MCH: 30 pg (ref 26.0–34.0)
MCHC: 33.6 g/dL (ref 30.0–36.0)
MCV: 89.1 fL (ref 80.0–100.0)
Platelets: 193 10*3/uL (ref 150–400)
RBC: 3.87 MIL/uL (ref 3.87–5.11)
RDW: 13.8 % (ref 11.5–15.5)
WBC: 5.3 10*3/uL (ref 4.0–10.5)
nRBC: 0 % (ref 0.0–0.2)

## 2022-11-15 LAB — MAGNESIUM: Magnesium: 2 mg/dL (ref 1.7–2.4)

## 2022-11-15 MED ORDER — ASPIRIN 81 MG PO TBEC
81.0000 mg | DELAYED_RELEASE_TABLET | Freq: Every day | ORAL | Status: DC
  Administered 2022-11-16: 81 mg via ORAL
  Filled 2022-11-15: qty 1

## 2022-11-15 NOTE — Progress Notes (Signed)
  PROGRESS NOTE    Maria Sanchez  ONG:[id number] DOB: 12-02-1930 DOA: 11/13/2022 PCP: Maria Code, MD  155A/[id number]-AA  LOS: 1 day   Brief hospital course:   Assessment & Plan: Maria Sanchez is a 89 y.o. female with medical history significant for CAD, HTN, dementia who presented to the ED with a few day history of worsening weakness and decreased oral intake.   At baseline, pt sleeps all day but gets up to eat 3 meals.  Can walk with shuffling steps with caregiver assist.    * Generalized weakness Secondary to UTI.  Contributed by deconditioning. --PT/OT, daughter wants to take pt home.  Urinary tract infection --cont ceftriaxone pending urine cx  Subcapital fracture of the right femoral neck, stable, age indeterminate. --pt did not complain of pain.  Was ambulating with RLE prior to recent worsening weakness. --Discussed with ortho Dr. Hyacinth Sanchez, can bear weight as tolerated with walker. --daughter prefer to follow up with [facility] ortho  CAD (coronary artery disease) --cont home ASA --resume statin after discharge  Acquired hypothyroidism Continue levothyroxine  Essential hypertension Continue amlodipine and hydralazine   DVT prophylaxis: Lovenox SQ Sanchez Status: DNR  Family Communication: daughter updated at bedside today Level of care: Med-Surg Dispo:   The patient is from: home Anticipated d/c is to: home Anticipated d/c date is: 1-2 days   Subjective and Interval History:  Pt still sleeping a lot, but per daughter and caregiver, was more alert today.  Did get up to eat her breakfast.   Objective: Vitals:   11/14/22 2102 11/15/22 0000 11/15/22 0750 11/15/22 1500  BP: (!) 152/83  (!) 145/72 (!) 149/95  Pulse: 98 65 65 75  Resp: 18 16 16 16   Temp: 97.7 F (36.5 C) 97.8 F (36.6 C) (!) 97.5 F (36.4 C) 98.3 F (36.8 C)  TempSrc:      SpO2: 99% 94% 98% 100%  Weight:      Height:        Intake/Output Summary (Last 24 hours) at 11/15/2022 1544 Last data  filed at 11/15/2022 0645 Gross per 24 hour  Intake 100 ml  Output --  Net 100 ml   Filed Weights   11/13/22 1545  Weight: 47.6 kg    Examination:   Constitutional: NAD, sleeping but arousable, not answering questions HEENT: conjunctivae and lids normal, EOMI CV: No cyanosis.   RESP: normal respiratory effort, on RA   Data Reviewed: I have personally reviewed labs and imaging studies  Time spent: 50 minutes  Maria Priestly, MD [facility] If 7PM-7AM, please contact night-coverage 11/15/2022, 3:44 PM

## 2022-11-15 NOTE — Plan of Care (Signed)

## 2022-11-16 DIAGNOSIS — R531 Weakness: Secondary | ICD-10-CM | POA: Diagnosis not present

## 2022-11-16 LAB — CBC
HCT: 31.3 % — ABNORMAL LOW (ref 36.0–46.0)
Hemoglobin: 10.8 g/dL — ABNORMAL LOW (ref 12.0–15.0)
MCH: 30 pg (ref 26.0–34.0)
MCHC: 34.5 g/dL (ref 30.0–36.0)
MCV: 86.9 fL (ref 80.0–100.0)
Platelets: 199 10*3/uL (ref 150–400)
RBC: 3.6 MIL/uL — ABNORMAL LOW (ref 3.87–5.11)
RDW: 13.6 % (ref 11.5–15.5)
WBC: 6.9 10*3/uL (ref 4.0–10.5)
nRBC: 0 % (ref 0.0–0.2)

## 2022-11-16 LAB — URINE CULTURE: Culture: >=100000 — AB

## 2022-11-16 LAB — BASIC METABOLIC PANEL
Anion gap: 7 (ref 5–15)
BUN: 22 mg/dL (ref 8–23)
CO2: 27 mmol/L (ref 22–32)
Calcium: 8.2 mg/dL — ABNORMAL LOW (ref 8.9–10.3)
Chloride: 98 mmol/L (ref 98–111)
Creatinine, Ser: 0.91 mg/dL (ref 0.44–1.00)
GFR, Estimated: >60 mL/min (ref >60)
Glucose, Bld: 103 mg/dL — ABNORMAL HIGH (ref 70–99)
Potassium: 3.7 mmol/L (ref 3.5–5.1)
Sodium: 132 mmol/L — ABNORMAL LOW (ref 135–145)

## 2022-11-16 LAB — MAGNESIUM: Magnesium: 1.9 mg/dL (ref 1.7–2.4)

## 2022-11-16 NOTE — Discharge Summary (Signed)
Physician Discharge Summary   Maria Sanchez  female DOB: [id number]  ZOX:[id number]  PCP: Lyndon Code, MD  Admit date: 11/13/2022 Discharge date: 11/16/2022  Admitted From: home Disposition:  home Daughter updated prior to discharge. Home Health: Yes CODE STATUS: DNR  Discharge Instructions     Discharge instructions   Complete by: As directed    You have UTI from Proteus bacteria.  You have received 3 days of IV antibiotic which works for this bacteria, and you have completed treatment.  No need for more antibiotic after discharge. [facility] Course:  For full details, please see H&P, progress notes, consult notes and ancillary notes.  Briefly,  Maria Sanchez is a 89 y.o. female with medical history significant for CAD, HTN, dementia who presented to the ED with a few day history of worsening weakness and decreased oral intake.    At baseline, pt sleeps all day but gets up to eat 3 meals.  Can walk with shuffling steps with caregiver assist.    * Generalized weakness Also more somnolent.  Daughter said this is typical of pt's UTI which pt gets about once every 4 months.   --workup largely neg except for bacteruria, so pt was started on tx for UTI. --PT/OT eval.     Urinary tract infection from Proteus --Pt completed 3 days of ceftriaxone which completed the course of tx.   Subcapital fracture of the right femoral neck, stable, age indeterminate. --Incidental finding.  pt did not complain of pain.  Was ambulating with RLE prior to recent worsening weakness. --Discussed with ortho Dr. Hyacinth Meeker, can bear weight as tolerated with walker. --daughter prefer to follow up with [facility] ortho, so will follow up with Dr. Joice Lofts as outpatient.   CAD (coronary artery disease) --cont home ASA --resume statin after discharge   Acquired hypothyroidism Continue levothyroxine   Essential hypertension Continue amlodipine and hydralazine   Unless noted above, medications  under "STOP" list are ones pt was not taking PTA.  Discharge Diagnoses:  Principal Problem:   Generalized weakness Active Problems:   Urinary tract infection   Ambulatory dysfunction   Essential hypertension   Acquired hypothyroidism   CAD (coronary artery disease)   Weakness   30 Day Unplanned Readmission Risk Score    Flowsheet Row ED to Hosp-Admission (Current) from 11/13/2022 in [facility] (1A)  30 Day Unplanned Readmission Risk Score (%) 15.55 Filed at 11/16/2022 0801       This score is the patient's risk of an unplanned readmission within 30 days of being discharged (0 -100%). The score is based on dignosis, age, lab data, medications, orders, and past utilization.   Low:  0-14.9   Medium: 15-21.9   High: 22-29.9   Extreme: 30 and above         Discharge Instructions:  Allergies as of 11/16/2022       Reactions   Morphine And Codeine Other (See Comments)   "makes me crazy"   Requip [ropinirole Hcl]         Medication List     STOP taking these medications    cefUROXime 250 MG tablet Commonly known as: CEFTIN   lidocaine 5 % Commonly known as: Lidoderm   nitrofurantoin (macrocrystal-monohydrate) 100 MG capsule Commonly known as: MACROBID       TAKE these medications    allopurinol 100 MG tablet Commonly known as: ZYLOPRIM TAKE 2 TABLETS BY MOUTH AT BEDTIME  AS NEEDED FOR GOUT   amLODipine 5 MG tablet Commonly known as: NORVASC TAKE (1) TABLET BY MOUTH EVERY DAY   aspirin EC 81 MG tablet Take 81 mg daily by mouth.   cholecalciferol 25 MCG (1000 UNIT) tablet Commonly known as: VITAMIN D3 Take 1,000 Units by mouth daily.   escitalopram 10 MG tablet Commonly known as: LEXAPRO TAKE (1) TABLET BY MOUTH EVERY DAY WITH SUPPER   hydrALAZINE 25 MG tablet Commonly known as: APRESOLINE TAKE 1 TABLET BY MOUTH 3 TIMES A DAY FOR BLOOD PRESSURE   levothyroxine 75 MCG tablet Commonly known as: SYNTHROID TAKE (1)  TABLET BY MOUTH EVERY DAY   pravastatin 10 MG tablet Commonly known as: PRAVACHOL Take 1 tablet (10 mg total) by mouth at bedtime.   risperiDONE 0.5 MG tablet Commonly known as: RISPERDAL Take 1 tablet (0.5 mg total) by mouth 2 (two) times daily. Takes one tablet in the morning and one tablet at night         Follow-up Information     Lyndon Code, MD Follow up in 1 week(s).   Specialties: Internal Medicine, Cardiology Contact information: [address] [place] [address] [phone]         Poggi, Excell Seltzer, MD Follow up in 2 week(s).   Specialty: Orthopedic Surgery Contact information: [address] [facility] [place] [address] [phone]                 Allergies  Allergen Reactions   Morphine And Codeine Other (See Comments)    "makes me crazy"   Requip [Ropinirole Hcl]      The results of significant diagnostics from this hospitalization (including imaging, microbiology, ancillary and laboratory) are listed below for reference.   Consultations:   Procedures/Studies: CT HIP RIGHT WO CONTRAST  Result Date: 11/14/2022 CLINICAL DATA:  Hip trauma, fracture suspected. Age-indeterminate impacted fracture of the right femoral neck. EXAM: CT OF THE RIGHT HIP WITHOUT CONTRAST TECHNIQUE: Multidetector CT imaging of the right hip was performed according to the standard protocol. Multiplanar CT image reconstructions were also generated. RADIATION DOSE REDUCTION: This exam was performed according to the departmental dose-optimization program which includes automated exposure control, adjustment of the mA and/or kV according to patient size and/or use of iterative reconstruction technique. COMPARISON:  Abdominopelvic CT 11/13/2022 and 08/18/2007. No relevant interval studies identified. FINDINGS: Bones/Joint/Cartilage The bones are mildly demineralized. As on recent pelvic CT, both hips are moderately flexed. There is a subcapital fracture  of the right femoral neck which is moderately impacted but not significantly displaced. This fracture is age indeterminate by CT. No intertrochanteric extension, dislocation or pelvic fracture. Mild underlying right hip degenerative changes without significant joint effusion. Ligaments Suboptimally assessed by CT. Muscles and Tendons No focal muscular atrophy or intramuscular fluid collection identified. Soft tissues Generalized subcutaneous, retroperitoneal and mesenteric edema, similar to recent CT. No focal soft tissue hematoma identified. There is a moderate amount of stool in the rectum. Diffuse vascular calcifications are noted. IMPRESSION: 1. Stable subcapital fracture of the right femoral neck, age indeterminate by repeat CT. No intertrochanteric extension, dislocation or pelvic fracture. Correlate with point tenderness and history. MRI could be considered for fracture aging if clinically warranted. 2. Generalized subcutaneous, retroperitoneal and mesenteric edema, similar to recent pelvic CT. Electronically Signed   By: Carey Bullocks M.D.   On: 11/14/2022 10:10   CT ABDOMEN PELVIS W CONTRAST  Result Date: 11/13/2022 CLINICAL DATA:  Abdominal pain, acute, nonlocalized EXAM:  CT ABDOMEN AND PELVIS WITH CONTRAST TECHNIQUE: Multidetector CT imaging of the abdomen and pelvis was performed using the standard protocol following bolus administration of intravenous contrast. RADIATION DOSE REDUCTION: This exam was performed according to the departmental dose-optimization program which includes automated exposure control, adjustment of the mA and/or kV according to patient size and/or use of iterative reconstruction technique. CONTRAST:  75mL OMNIPAQUE IOHEXOL 300 MG/ML  SOLN COMPARISON:  CT abdomen pelvis 08/18/2007 FINDINGS: Lower chest: Four-vessel coronary calcification. Severe mitral annular calcification. At least moderate aortic valve leaflet calcification. Atherosclerotic plaque. At least small volume  left pleural effusion. Hepatobiliary: Nonspecific punctate calcification. No focal liver abnormality. Status post cholecystectomy. No biliary dilatation. Pancreas: Diffusely atrophic. No focal lesion. Otherwise normal pancreatic contour. No surrounding inflammatory changes. No main pancreatic ductal dilatation. Spleen: Normal in size without focal abnormality. Adrenals/Urinary Tract: No adrenal nodule bilaterally. Atrophic right kidney. Bilateral kidneys enhance symmetrically. Fluid density lesion within left kidney likely represents a simple renal cyst. Simple renal cysts, in the absence of clinically indicated signs/symptoms, require no independent follow-up. Left nephrolithiasis measuring up to 2 mm. No hydronephrosis. No hydroureter. Circumferential urinary bladder wall thickening with perivesicular fat stranding. On delayed imaging, there is no urothelial wall thickening and there are no filling defects in the opacified portions of the bilateral collecting systems or ureters. Stomach/Bowel: Stomach is within normal limits. No evidence of bowel wall thickening or dilatation. Colonic diverticulosis. Stool within the rectum. Appendix appears normal. Vascular/Lymphatic: No abdominal aorta or iliac aneurysm. Severe atherosclerotic plaque of the aorta and its branches. No abdominal, pelvic, or inguinal lymphadenopathy. Reproductive: Status post hysterectomy. No adnexal masses. Other: No intraperitoneal free fluid. No intraperitoneal free gas. No organized fluid collection. Musculoskeletal: No abdominal wall hernia or abnormality. Diffusely decreased bone density. Diffusely decreased bone density. No suspicious lytic or blastic osseous lesions. Grade 1 anterolisthesis of L4 on L5. Chronic superior endplate L2 vertebral body height loss with large Schmorl node. Age-indeterminate, likely chronic, impacted right femoral neck fracture. Bilateral hand carpal bone degenerative changes. IMPRESSION: 1. At least small volume  pleural effusion. 2. Cystitis 3. Nonobstructive left nephrolithiasis measuring up to 2 mm. 4. Atrophic right kidney. 5. Colonic diverticulosis with no acute diverticulitis. 6. Aortic Atherosclerosis (ICD10-I70.0). Four-vessel coronary calcification. Severe mitral annular calcification. At least moderate aortic valve leaflet calcification. 7. Age-indeterminate, likely chronic, impacted right femoral neck fracture. If concern for acute traumatic injury recommend dedicated radiograph of the right hip. Electronically Signed   By: Tish Frederickson M.D.   On: 11/13/2022 23:46   DG Chest 2 View  Result Date: 11/13/2022 CLINICAL DATA:  Generalized weakness EXAM: CHEST - 2 VIEW COMPARISON:  05/26/2013 FINDINGS: Moderate left pleural effusion with associated left basilar compressive atelectasis. Right lung is clear. No pneumothorax. No pleural effusion on the right. Coronary artery bypass grafting has been performed. Cardiac size is within normal limits. Pulmonary vascularity is normal. No acute bone abnormality. IMPRESSION: 1. Moderate left pleural effusion with associated left basilar compressive atelectasis. Electronically Signed   By: Helyn Numbers M.D.   On: 11/13/2022 22:28   CT Head Wo Contrast  Result Date: 11/13/2022 CLINICAL DATA:  Mental status change, unknown cause EXAM: CT HEAD WITHOUT CONTRAST TECHNIQUE: Contiguous axial images were obtained from the base of the skull through the vertex without intravenous contrast. RADIATION DOSE REDUCTION: This exam was performed according to the departmental dose-optimization program which includes automated exposure control, adjustment of the mA and/or kV according to patient size and/or use of iterative reconstruction  technique. COMPARISON:  MRI head 01/24/2020. FINDINGS: Brain: No evidence of acute large vascular territory infarction, hemorrhage, hydrocephalus, extra-axial collection or mass lesion/mass effect. Patchy white matter hypodensities, nonspecific but  compatible with chronic microvascular ischemic change. Vascular: No hyperdense vessel. Skull: No acute fracture. Sinuses/Orbits: No acute finding. Other: No mastoid effusions. IMPRESSION: 1. No evidence of acute intracranial abnormality. 2. Chronic microvascular ischemic change. Electronically Signed   By: Feliberto Harts M.D.   On: 11/13/2022 16:30      Labs: BNP (last 3 results) No results for input(s): "BNP" in the last 8760 hours. Basic Metabolic Panel: Recent Labs  Lab 11/13/22 1550 11/14/22 0305 11/15/22 0525 11/16/22 0525  NA 136  --  137 132*  K 3.5  --  3.6 3.7  CL 103  --  102 98  CO2 27  --  27 27  GLUCOSE 111*  --  95 103*  BUN 19  --  16 22  CREATININE 0.87 0.79 0.74 0.91  CALCIUM 8.2*  --  8.3* 8.2*  MG  --   --  2.0 1.9   Liver Function Tests: Recent Labs  Lab 11/13/22 1550  AST 22  ALT 12  ALKPHOS 49  BILITOT 0.5  PROT 6.6  ALBUMIN 3.1*   No results for input(s): "LIPASE", "AMYLASE" in the last 168 hours. No results for input(s): "AMMONIA" in the last 168 hours. CBC: Recent Labs  Lab 11/13/22 1550 11/14/22 0305 11/15/22 0525 11/16/22 0525  WBC 6.1 5.3 5.3 6.9  HGB 11.0* 11.1* 11.6* 10.8*  HCT 33.3* 33.9* 34.5* 31.3*  MCV 88.8 90.6 89.1 86.9  PLT 193 173 193 199   Cardiac Enzymes: No results for input(s): "CKTOTAL", "CKMB", "CKMBINDEX", "TROPONINI" in the last 168 hours. BNP: Invalid input(s): "POCBNP" CBG: No results for input(s): "GLUCAP" in the last 168 hours. D-Dimer No results for input(s): "DDIMER" in the last 72 hours. Hgb A1c No results for input(s): "HGBA1C" in the last 72 hours. Lipid Profile No results for input(s): "CHOL", "HDL", "LDLCALC", "TRIG", "CHOLHDL", "LDLDIRECT" in the last 72 hours. Thyroid function studies No results for input(s): "TSH", "T4TOTAL", "T3FREE", "THYROIDAB" in the last 72 hours.  Invalid input(s): "FREET3" Anemia work up No results for input(s): "VITAMINB12", "FOLATE", "FERRITIN", "TIBC", "IRON",  "RETICCTPCT" in the last 72 hours. Urinalysis    Component Value Date/Time   COLORURINE YELLOW (A) 11/13/2022 2230   APPEARANCEUR TURBID (A) 11/13/2022 2230   APPEARANCEUR Cloudy (A) 06/14/2021 1348   LABSPEC 1.015 11/13/2022 2230   PHURINE 7.0 11/13/2022 2230   GLUCOSEU NEGATIVE 11/13/2022 2230   HGBUR NEGATIVE 11/13/2022 2230   BILIRUBINUR NEGATIVE 11/13/2022 2230   BILIRUBINUR Negative 06/14/2021 1348   KETONESUR 5 (A) 11/13/2022 2230   PROTEINUR >=300 (A) 11/13/2022 2230   UROBILINOGEN 0.2 08/07/2020 0937   NITRITE NEGATIVE 11/13/2022 2230   LEUKOCYTESUR MODERATE (A) 11/13/2022 2230   Sepsis Labs Recent Labs  Lab 11/13/22 1550 11/14/22 0305 11/15/22 0525 11/16/22 0525  WBC 6.1 5.3 5.3 6.9   Microbiology Recent Results (from the past 240 hour(s))  Urine Culture (for pregnant, neutropenic or urologic patients or patients with an indwelling urinary catheter)     Status: Abnormal   Collection Time: 11/13/22 10:35 PM   Specimen: Urine, Clean Catch  Result Value Ref Range Status   Specimen Description   Final    URINE, CLEAN CATCH Performed at [facility], [address], [place], [place] [address]    Special Requests   Final    NONE  Performed at [facility], [address] [place], [place] [address]    Culture >=100,000 COLONIES/mL PROTEUS MIRABILIS (A)  Final   Report Status 11/16/2022 FINAL  Final   Organism ID, Bacteria PROTEUS MIRABILIS (A)  Final      Susceptibility   Proteus mirabilis - MIC*    AMPICILLIN >=32 RESISTANT Resistant     CEFAZOLIN 16 SENSITIVE Sensitive     CEFEPIME <=0.12 SENSITIVE Sensitive     CEFTRIAXONE <=0.25 SENSITIVE Sensitive     CIPROFLOXACIN <=0.25 SENSITIVE Sensitive     GENTAMICIN <=1 SENSITIVE Sensitive     IMIPENEM 8 INTERMEDIATE Intermediate     NITROFURANTOIN 128 RESISTANT Resistant     TRIMETH/SULFA >=320 RESISTANT Resistant     AMPICILLIN/SULBACTAM 8 SENSITIVE Sensitive     PIP/TAZO <=4 SENSITIVE  Sensitive     * >=100,000 COLONIES/mL PROTEUS MIRABILIS     Total time spend on discharging this patient, including the last patient exam, discussing the hospital stay, instructions for ongoing care as it relates to all pertinent caregivers, as well as preparing the medical discharge records, prescriptions, and/or referrals as applicable, is 30 minutes.    Darlin Priestly, MD  [facility] 11/16/2022, 11:20 AM

## 2022-11-16 NOTE — Plan of Care (Signed)
  Problem: Education: Goal: Knowledge of General Education information will improve Description: Including pain rating scale, medication(s)/side effects and non-pharmacologic comfort measures Outcome: Progressing   Problem: Clinical Measurements: Goal: Ability to maintain clinical measurements within normal limits will improve Outcome: Progressing   Problem: Activity: Goal: Risk for activity intolerance will decrease Outcome: Progressing   Problem: Nutrition: Goal: Adequate nutrition will be maintained Outcome: Progressing   Problem: Elimination: Goal: Will not experience complications related to bowel motility Outcome: Progressing   

## 2022-11-16 NOTE — Progress Notes (Signed)
DISCHARGE NOTE:    Pt discharged with instructions and education given to daughter at bedside. Pt's daughter states they have 24/7 care and HH will be coming as well. Pt was not able to walk to wheelchair and needed max assistance. Nurse assured the family member that she would need help getting her into the house when home. Gate belt was used for safety in the transfer from the wheelchair into car. Pt was not able to use her legs or give any assistance. Pt was transferred into the car and daughter provided transportation.

## 2022-11-16 NOTE — Plan of Care (Signed)

## 2022-11-17 ENCOUNTER — Other Ambulatory Visit: Payer: Self-pay | Admitting: Internal Medicine

## 2022-11-18 ENCOUNTER — Telehealth: Payer: Medicare Other | Admitting: Internal Medicine

## 2022-11-18 ENCOUNTER — Encounter: Payer: Self-pay | Admitting: Internal Medicine

## 2022-11-18 VITALS — Resp 16 | Ht 61.0 in

## 2022-11-18 DIAGNOSIS — F0150 Vascular dementia without behavioral disturbance: Secondary | ICD-10-CM

## 2022-11-18 NOTE — Progress Notes (Signed)
Pt was instructed to go to ED.

## 2022-11-20 ENCOUNTER — Telehealth: Payer: Medicare Other | Admitting: Physician Assistant

## 2022-11-20 ENCOUNTER — Encounter: Payer: Self-pay | Admitting: Physician Assistant

## 2022-11-20 VITALS — BP 145/59 | HR 73 | Resp 16 | Ht 61.0 in | Wt 105.0 lb

## 2022-11-20 DIAGNOSIS — Z09 Encounter for follow-up examination after completed treatment for conditions other than malignant neoplasm: Secondary | ICD-10-CM

## 2022-11-20 DIAGNOSIS — S72044A Nondisplaced fracture of base of neck of right femur, initial encounter for closed fracture: Secondary | ICD-10-CM | POA: Diagnosis not present

## 2022-11-20 DIAGNOSIS — S72011A Unspecified intracapsular fracture of right femur, initial encounter for closed fracture: Secondary | ICD-10-CM

## 2022-11-20 DIAGNOSIS — N390 Urinary tract infection, site not specified: Secondary | ICD-10-CM | POA: Diagnosis not present

## 2022-11-20 NOTE — Progress Notes (Signed)
[facility] [address] [place], [place] [address]  Internal MEDICINE  Telephone Visit  Patient Name: Maria Sanchez  [id number]  [id number]  Date of Service: 11/25/2022  I connected with the patient at 1:56 by telephone and verified the patients identity using two identifiers.   I discussed the limitations, risks, security and privacy concerns of performing an evaluation and management service by telephone and the availability of in person appointments. I also discussed with the patient that there may be a patient responsible charge related to the service.  The patient expressed understanding and agrees to proceed.    Chief Complaint  Patient presents with   Telephone Screen    Was admitted for uti, dehydrated, stomach inflame, xray of brain.small fracture but might be a old one. Lost the use of her legs on Wednesday. [phone]   Telephone Assessment    HPI Pt is here for virtual hospital follow up -spoke with her daughter Excell Seltzer -Was admitted from ED on 7/18-7/21 due to disorientation that started Wednesday morning with inability to walk due to weakness, and was found to have a UTI and was dehydrated.  -Had imaging done as well and CT did show a small chronic appearing right femoral neck fracture, but age could not be determined and pt was not complaining of any pain. Advised to follow up with ortho in 2 weeks on discharge paperwork -treated with IV antibiotics and fluids for UTI and improved without need for oral ABX at discharge -Came home Sunday, eating well and has been drinking Pedialyte, much more alert now -Daughter requests referral for PT to help with increasing strength, will check with ortho prior   Current Medication: Outpatient Encounter Medications as of 11/20/2022  Medication Sig   allopurinol (ZYLOPRIM) 100 MG tablet TAKE 2 TABLETS BY MOUTH AT BEDTIME AS NEEDED FOR GOUT   amLODipine (NORVASC) 5 MG tablet TAKE (1) TABLET BY MOUTH EVERY DAY    aspirin EC 81 MG tablet Take 81 mg daily by mouth.   cholecalciferol (VITAMIN D3) 25 MCG (1000 UNIT) tablet Take 1,000 Units by mouth daily.   escitalopram (LEXAPRO) 10 MG tablet TAKE (1) TABLET BY MOUTH EVERY DAY WITH SUPPER   hydrALAZINE (APRESOLINE) 25 MG tablet TAKE 1 TABLET BY MOUTH 3 TIMES A DAY FOR BLOOD PRESSURE   levothyroxine (SYNTHROID) 75 MCG tablet TAKE (1) TABLET BY MOUTH EVERY DAY   pravastatin (PRAVACHOL) 10 MG tablet Take 1 tablet (10 mg total) by mouth at bedtime.   risperiDONE (RISPERDAL) 0.5 MG tablet Take 1 tablet (0.5 mg total) by mouth 2 (two) times daily. Takes one tablet in the morning and one tablet at night   No facility-administered encounter medications on file as of 11/20/2022.    Surgical History: Past Surgical History:  Procedure Laterality Date   ABDOMINAL HYSTERECTOMY     BREAST SURGERY     benign   CHOLECYSTECTOMY     CORONARY ARTERY BYPASS GRAFT     exicision salivary gland     MITRAL VALVE REPLACEMENT (MVR)/CORONARY ARTERY BYPASS GRAFTING (CABG)     posterolateral fusion     stent placed in kidney Right    thumb arthroplasty  06/20/2005    Medical History: Past Medical History:  Diagnosis Date   Arthritis    CAD (coronary artery disease)    Dementia (HCC)    Hyperlipidemia    Hypertension    Renal artery stenosis (HCC)    Thyroid disease     Family History:  Family History  Problem Relation Age of Onset   Colon cancer Mother    Breast cancer Sister    Colon cancer Brother     Social History   Socioeconomic History   Marital status: Widowed    Spouse name: Not on file   Number of children: Not on file   Years of education: Not on file   Highest education level: Not on file  Occupational History   Not on file  Tobacco Use   Smoking status: Never   Smokeless tobacco: Never  Vaping Use   Vaping status: Never Used  Substance and Sexual Activity   Alcohol use: No   Drug use: No   Sexual activity: Never  Other Topics Concern    Not on file  Social History Narrative   Not on file   Social Determinants of Health   Financial Resource Strain: Not on file  Food Insecurity: No Food Insecurity (11/14/2022)   Hunger Vital Sign    Worried About Running Out of Food in the Last Year: Never true    Ran Out of Food in the Last Year: Never true  Transportation Needs: Not on file  Physical Activity: Not on file  Stress: Not on file  Social Connections: Not on file  Intimate Partner Violence: Not on file      Review of Systems  Constitutional:  Positive for fatigue. Negative for chills and unexpected weight change.  HENT:  Negative for congestion, postnasal drip, rhinorrhea, sneezing and sore throat.   Eyes:  Negative for redness.  Respiratory:  Negative for cough, chest tightness and shortness of breath.   Cardiovascular:  Negative for chest pain and palpitations.  Gastrointestinal:  Negative for abdominal pain, constipation, diarrhea, nausea and vomiting.  Genitourinary:  Negative for dysuria and frequency.  Musculoskeletal:  Negative for arthralgias, back pain, joint swelling and neck pain.  Skin:  Negative for rash.  Neurological:  Positive for weakness. Negative for tremors and numbness.  Hematological:  Negative for adenopathy. Does not bruise/bleed easily.  Psychiatric/Behavioral:  Negative for behavioral problems (Depression), sleep disturbance and suicidal ideas. The patient is not nervous/anxious.     Vital Signs: BP (!) 145/59   Pulse 73   Resp 16   Ht 5\' 1"  (1.549 m)   Wt 105 lb (47.6 kg)   BMI 19.84 kg/m    Observation/Objective:  Pt's daughter provides information   Assessment/Plan: 1. Hospital discharge follow-up Reviewed hospital course  2. Subcapital fracture of neck of right femur, closed, initial encounter (HCC) Chronic appearing, recommended to follow up with ortho as an OP and will schedule appt. May consider PT pending evaluation  3. Urinary tract infection without hematuria,  site unspecified Resolved, drinking plenty of fluids   General Counseling: emalene welte understanding of the findings of today's phone visit and agrees with plan of treatment. I have discussed any further diagnostic evaluation that may be needed or ordered today. We also reviewed her medications today. she has been encouraged to call the office with any questions or concerns that should arise related to todays visit.    No orders of the defined types were placed in this encounter.   No orders of the defined types were placed in this encounter.   Time spent:35 Minutes    Dr Lyndon Code Internal medicine

## 2022-11-25 ENCOUNTER — Telehealth: Payer: Self-pay

## 2022-11-25 NOTE — Telephone Encounter (Signed)
Left message for patient's daughter, Olegario Messier, notifying her that she can call [facility] to schedule an ortho appt for her mother, no referral required.

## 2022-11-27 DIAGNOSIS — M25551 Pain in right hip: Secondary | ICD-10-CM | POA: Diagnosis not present

## 2022-11-27 DIAGNOSIS — S72091A Other fracture of head and neck of right femur, initial encounter for closed fracture: Secondary | ICD-10-CM | POA: Diagnosis not present

## 2022-11-27 DIAGNOSIS — S72011A Unspecified intracapsular fracture of right femur, initial encounter for closed fracture: Secondary | ICD-10-CM | POA: Diagnosis not present

## 2022-11-27 DIAGNOSIS — I739 Peripheral vascular disease, unspecified: Secondary | ICD-10-CM | POA: Diagnosis not present

## 2022-12-02 ENCOUNTER — Other Ambulatory Visit: Payer: Self-pay | Admitting: Physician Assistant

## 2022-12-02 ENCOUNTER — Telehealth: Payer: Self-pay

## 2022-12-02 DIAGNOSIS — R300 Dysuria: Secondary | ICD-10-CM

## 2022-12-02 MED ORDER — FOSFOMYCIN TROMETHAMINE 3 G PO PACK
3.0000 g | PACK | Freq: Once | ORAL | 0 refills | Status: AC
Start: 2022-12-02 — End: 2022-12-02

## 2022-12-03 NOTE — Telephone Encounter (Signed)
Pt daughter advised we send med and monitor her symptoms if worse go to ED

## 2022-12-11 ENCOUNTER — Telehealth: Payer: Self-pay

## 2022-12-11 DIAGNOSIS — E119 Type 2 diabetes mellitus without complications: Secondary | ICD-10-CM | POA: Diagnosis not present

## 2022-12-11 DIAGNOSIS — S72012D Unspecified intracapsular fracture of left femur, subsequent encounter for closed fracture with routine healing: Secondary | ICD-10-CM | POA: Diagnosis not present

## 2022-12-11 DIAGNOSIS — I2510 Atherosclerotic heart disease of native coronary artery without angina pectoris: Secondary | ICD-10-CM | POA: Diagnosis not present

## 2022-12-11 DIAGNOSIS — Z7982 Long term (current) use of aspirin: Secondary | ICD-10-CM | POA: Diagnosis not present

## 2022-12-11 DIAGNOSIS — M1990 Unspecified osteoarthritis, unspecified site: Secondary | ICD-10-CM | POA: Diagnosis not present

## 2022-12-11 DIAGNOSIS — Z87891 Personal history of nicotine dependence: Secondary | ICD-10-CM | POA: Diagnosis not present

## 2022-12-11 DIAGNOSIS — Z79891 Long term (current) use of opiate analgesic: Secondary | ICD-10-CM | POA: Diagnosis not present

## 2022-12-11 DIAGNOSIS — E785 Hyperlipidemia, unspecified: Secondary | ICD-10-CM | POA: Diagnosis not present

## 2022-12-11 DIAGNOSIS — F0280 Dementia in other diseases classified elsewhere without behavioral disturbance: Secondary | ICD-10-CM | POA: Diagnosis not present

## 2022-12-11 DIAGNOSIS — E039 Hypothyroidism, unspecified: Secondary | ICD-10-CM | POA: Diagnosis not present

## 2022-12-11 DIAGNOSIS — M84451D Pathological fracture, right femur, subsequent encounter for fracture with routine healing: Secondary | ICD-10-CM | POA: Diagnosis not present

## 2022-12-11 DIAGNOSIS — Z85038 Personal history of other malignant neoplasm of large intestine: Secondary | ICD-10-CM | POA: Diagnosis not present

## 2022-12-11 DIAGNOSIS — I10 Essential (primary) hypertension: Secondary | ICD-10-CM | POA: Diagnosis not present

## 2022-12-11 DIAGNOSIS — G309 Alzheimer's disease, unspecified: Secondary | ICD-10-CM | POA: Diagnosis not present

## 2022-12-11 DIAGNOSIS — N390 Urinary tract infection, site not specified: Secondary | ICD-10-CM | POA: Diagnosis not present

## 2022-12-11 NOTE — Telephone Encounter (Signed)
Pt daughter called that pt skin peeing and bleeding little  and antibiotic ointment and wrapped not black not hot to touch advised her keep eye on this if any need appt asap

## 2022-12-15 ENCOUNTER — Telehealth (INDEPENDENT_AMBULATORY_CARE_PROVIDER_SITE_OTHER): Payer: Medicare Other | Admitting: Nurse Practitioner

## 2022-12-15 ENCOUNTER — Telehealth: Payer: Self-pay

## 2022-12-15 VITALS — Ht 60.0 in | Wt 105.0 lb

## 2022-12-15 DIAGNOSIS — S51811A Laceration without foreign body of right forearm, initial encounter: Secondary | ICD-10-CM | POA: Diagnosis not present

## 2022-12-15 DIAGNOSIS — S51812A Laceration without foreign body of left forearm, initial encounter: Secondary | ICD-10-CM | POA: Diagnosis not present

## 2022-12-15 MED ORDER — MUPIROCIN 2 % EX OINT
1.0000 | TOPICAL_OINTMENT | Freq: Two times a day (BID) | CUTANEOUS | 2 refills | Status: DC
Start: 2022-12-15 — End: 2024-01-27

## 2022-12-15 NOTE — Progress Notes (Signed)
[facility] [address] [place], [place] [address]  Internal MEDICINE  Telephone Visit  Patient Name: Maria Sanchez  [id number]  [id number]  Date of Service: 12/15/2022  I connected with the patient at 1505 by telephone and verified the patients identity using two identifiers.   I discussed the limitations, risks, security and privacy concerns of performing an evaluation and management service by telephone and the availability of in person appointments. I also discussed with the patient that there may be a patient responsible charge related to the service.  The patient expressed understanding and agrees to proceed.    Chief Complaint  Patient presents with   Telephone Assessment    [phone]   Telephone Screen    Skin tear both arm and bleeding     HPI Maria Sanchez presents for a telehealth virtual visit for bleeding skin tears on both arms Skin tears visible on the video call. No signs of infection noted. Her daughter is making sure to clean the wounds daily and help them to heal.    Current Medication: Outpatient Encounter Medications as of 12/15/2022  Medication Sig   allopurinol (ZYLOPRIM) 100 MG tablet TAKE 2 TABLETS BY MOUTH AT BEDTIME AS NEEDED FOR GOUT   amLODipine (NORVASC) 5 MG tablet TAKE (1) TABLET BY MOUTH EVERY DAY   aspirin EC 81 MG tablet Take 81 mg daily by mouth.   cholecalciferol (VITAMIN D3) 25 MCG (1000 UNIT) tablet Take 1,000 Units by mouth daily.   escitalopram (LEXAPRO) 10 MG tablet TAKE (1) TABLET BY MOUTH EVERY DAY WITH SUPPER   hydrALAZINE (APRESOLINE) 25 MG tablet TAKE 1 TABLET BY MOUTH 3 TIMES A DAY FOR BLOOD PRESSURE   levothyroxine (SYNTHROID) 75 MCG tablet TAKE (1) TABLET BY MOUTH EVERY DAY   mupirocin ointment (BACTROBAN) 2 % Apply 1 Application topically 2 (two) times daily. To open wounds on upper extremities until healed.   pravastatin (PRAVACHOL) 10 MG tablet Take 1 tablet (10 mg total) by mouth at bedtime.   risperiDONE (RISPERDAL)  0.5 MG tablet Take 1 tablet (0.5 mg total) by mouth 2 (two) times daily. Takes one tablet in the morning and one tablet at night   No facility-administered encounter medications on file as of 12/15/2022.    Surgical History: Past Surgical History:  Procedure Laterality Date   ABDOMINAL HYSTERECTOMY     BREAST SURGERY     benign   CHOLECYSTECTOMY     CORONARY ARTERY BYPASS GRAFT     exicision salivary gland     MITRAL VALVE REPLACEMENT (MVR)/CORONARY ARTERY BYPASS GRAFTING (CABG)     posterolateral fusion     stent placed in kidney Right    thumb arthroplasty  06/20/2005    Medical History: Past Medical History:  Diagnosis Date   Arthritis    CAD (coronary artery disease)    Dementia (HCC)    Hyperlipidemia    Hypertension    Renal artery stenosis (HCC)    Thyroid disease     Family History: Family History  Problem Relation Age of Onset   Colon cancer Mother    Breast cancer Sister    Colon cancer Brother     Social History   Socioeconomic History   Marital status: Widowed    Spouse name: Not on file   Number of children: Not on file   Years of education: Not on file   Highest education level: Not on file  Occupational History   Not on file  Tobacco Use  Smoking status: Never   Smokeless tobacco: Never  Vaping Use   Vaping status: Never Used  Substance and Sexual Activity   Alcohol use: No   Drug use: No   Sexual activity: Never  Other Topics Concern   Not on file  Social History Narrative   Not on file   Social Determinants of Health   Financial Resource Strain: Not on file  Food Insecurity: No Food Insecurity (11/14/2022)   Hunger Vital Sign    Worried About Running Out of Food in the Last Year: Never true    Ran Out of Food in the Last Year: Never true  Transportation Needs: Not on file  Physical Activity: Not on file  Stress: Not on file  Social Connections: Not on file  Intimate Partner Violence: Not on file      Review of Systems   Constitutional:  Positive for fatigue. Negative for chills and unexpected weight change.  HENT:  Negative for congestion, postnasal drip, rhinorrhea, sneezing and sore throat.   Eyes:  Negative for redness.  Respiratory:  Negative for cough, chest tightness and shortness of breath.   Cardiovascular:  Negative for chest pain and palpitations.  Gastrointestinal:  Negative for abdominal pain, constipation, diarrhea, nausea and vomiting.  Genitourinary:  Negative for dysuria and frequency.  Musculoskeletal:  Positive for arthralgias and gait problem. Negative for back pain, joint swelling and neck pain.  Skin:  Positive for wound. Negative for rash.  Neurological:  Positive for weakness. Negative for tremors and numbness.  Hematological:  Negative for adenopathy. Does not bruise/bleed easily.  Psychiatric/Behavioral:  Negative for behavioral problems (Depression), sleep disturbance and suicidal ideas. The patient is not nervous/anxious.     Vital Signs: Ht 5' (1.524 m)   Wt 105 lb (47.6 kg)   BMI 20.51 kg/m    Observation/Objective: Superficial skin tears on bilateral forearms observed over video call. No redness, swelling, bleeding or purulent drainage noted. Wounds look clean and dry.     Assessment/Plan: 1. Skin tear of left forearm without complication, initial encounter Mupirocin ointment prescribed to apply daily to help facilitate healing and prevent infection. Discussed signs of infection to look out for with daughter and she acknowledged that she will call the clinic if she observed any of these signs or symptoms.  - mupirocin ointment (BACTROBAN) 2 %; Apply 1 Application topically 2 (two) times daily. To open wounds on upper extremities until healed.  Dispense: 30 g; Refill: 2  2. Skin tear of right forearm without complication, initial encounter Mupirocin ointment prescribed to apply daily to help facilitate healing and prevent infection. Discussed signs of infection to look  out for with daughter and she acknowledged that she will call the clinic if she observed any of these signs or symptoms.  - mupirocin ointment (BACTROBAN) 2 %; Apply 1 Application topically 2 (two) times daily. To open wounds on upper extremities until healed.  Dispense: 30 g; Refill: 2   General Counseling: Maria Sanchez verbalizes understanding of the findings of today's phone visit and agrees with plan of treatment. I have discussed any further diagnostic evaluation that may be needed or ordered today. We also reviewed her medications today. she has been encouraged to call the office with any questions or concerns that should arise related to todays visit.  Return if symptoms worsen or fail to improve.   No orders of the defined types were placed in this encounter.   Meds ordered this encounter  Medications   mupirocin ointment (BACTROBAN)  2 %    Sig: Apply 1 Application topically 2 (two) times daily. To open wounds on upper extremities until healed.    Dispense:  30 g    Refill:  2    Fill new script today.    Time spent:20 Minutes Time spent with patient included reviewing progress notes, labs, imaging studies, and discussing plan for follow up.  [place] Controlled Substance Database was reviewed by me for overdose risk score (ORS) if appropriate.  This patient was seen by Sallyanne Kuster, FNP-C in collaboration with Dr. Beverely Risen as a part of collaborative care agreement.  Maria Pennix R. Tedd Sias, MSN, FNP-C Internal medicine

## 2022-12-15 NOTE — Telephone Encounter (Signed)
Gave verbal order to [facility] [phone] for skilled nurse evaluation  and for Physical therapy for 1 times 1 weeks and 1 times a week for 3 weeks and 1 times a week for 5 weeks

## 2022-12-16 DIAGNOSIS — M84451D Pathological fracture, right femur, subsequent encounter for fracture with routine healing: Secondary | ICD-10-CM | POA: Diagnosis not present

## 2022-12-16 DIAGNOSIS — G309 Alzheimer's disease, unspecified: Secondary | ICD-10-CM | POA: Diagnosis not present

## 2022-12-16 DIAGNOSIS — M1990 Unspecified osteoarthritis, unspecified site: Secondary | ICD-10-CM | POA: Diagnosis not present

## 2022-12-16 DIAGNOSIS — E119 Type 2 diabetes mellitus without complications: Secondary | ICD-10-CM | POA: Diagnosis not present

## 2022-12-16 DIAGNOSIS — F0280 Dementia in other diseases classified elsewhere without behavioral disturbance: Secondary | ICD-10-CM | POA: Diagnosis not present

## 2022-12-16 DIAGNOSIS — I2510 Atherosclerotic heart disease of native coronary artery without angina pectoris: Secondary | ICD-10-CM | POA: Diagnosis not present

## 2022-12-17 ENCOUNTER — Telehealth: Payer: Self-pay

## 2022-12-17 NOTE — Telephone Encounter (Signed)
Gave verbal order to cheryl from [facility] [phone] for ok to use xeroform dressing and frequency to pt 2 times a week for 4 weeks

## 2022-12-19 DIAGNOSIS — G309 Alzheimer's disease, unspecified: Secondary | ICD-10-CM | POA: Diagnosis not present

## 2022-12-19 DIAGNOSIS — E119 Type 2 diabetes mellitus without complications: Secondary | ICD-10-CM | POA: Diagnosis not present

## 2022-12-19 DIAGNOSIS — I2510 Atherosclerotic heart disease of native coronary artery without angina pectoris: Secondary | ICD-10-CM | POA: Diagnosis not present

## 2022-12-19 DIAGNOSIS — M1990 Unspecified osteoarthritis, unspecified site: Secondary | ICD-10-CM | POA: Diagnosis not present

## 2022-12-19 DIAGNOSIS — M84451D Pathological fracture, right femur, subsequent encounter for fracture with routine healing: Secondary | ICD-10-CM | POA: Diagnosis not present

## 2022-12-19 DIAGNOSIS — F0280 Dementia in other diseases classified elsewhere without behavioral disturbance: Secondary | ICD-10-CM | POA: Diagnosis not present

## 2022-12-23 DIAGNOSIS — I2510 Atherosclerotic heart disease of native coronary artery without angina pectoris: Secondary | ICD-10-CM | POA: Diagnosis not present

## 2022-12-23 DIAGNOSIS — F0280 Dementia in other diseases classified elsewhere without behavioral disturbance: Secondary | ICD-10-CM | POA: Diagnosis not present

## 2022-12-23 DIAGNOSIS — M1990 Unspecified osteoarthritis, unspecified site: Secondary | ICD-10-CM | POA: Diagnosis not present

## 2022-12-23 DIAGNOSIS — E119 Type 2 diabetes mellitus without complications: Secondary | ICD-10-CM | POA: Diagnosis not present

## 2022-12-23 DIAGNOSIS — G309 Alzheimer's disease, unspecified: Secondary | ICD-10-CM | POA: Diagnosis not present

## 2022-12-23 DIAGNOSIS — M84451D Pathological fracture, right femur, subsequent encounter for fracture with routine healing: Secondary | ICD-10-CM | POA: Diagnosis not present

## 2022-12-25 ENCOUNTER — Telehealth: Payer: Self-pay

## 2022-12-25 DIAGNOSIS — M1990 Unspecified osteoarthritis, unspecified site: Secondary | ICD-10-CM | POA: Diagnosis not present

## 2022-12-25 DIAGNOSIS — M84451D Pathological fracture, right femur, subsequent encounter for fracture with routine healing: Secondary | ICD-10-CM | POA: Diagnosis not present

## 2022-12-25 DIAGNOSIS — F0280 Dementia in other diseases classified elsewhere without behavioral disturbance: Secondary | ICD-10-CM | POA: Diagnosis not present

## 2022-12-25 DIAGNOSIS — E119 Type 2 diabetes mellitus without complications: Secondary | ICD-10-CM | POA: Diagnosis not present

## 2022-12-25 DIAGNOSIS — I2510 Atherosclerotic heart disease of native coronary artery without angina pectoris: Secondary | ICD-10-CM | POA: Diagnosis not present

## 2022-12-25 DIAGNOSIS — G309 Alzheimer's disease, unspecified: Secondary | ICD-10-CM | POA: Diagnosis not present

## 2022-12-25 NOTE — Telephone Encounter (Signed)
Gave verbal orders to Shelburn from [facility] for pressure wound order. [phone].

## 2022-12-30 DIAGNOSIS — M1990 Unspecified osteoarthritis, unspecified site: Secondary | ICD-10-CM | POA: Diagnosis not present

## 2022-12-30 DIAGNOSIS — I2510 Atherosclerotic heart disease of native coronary artery without angina pectoris: Secondary | ICD-10-CM | POA: Diagnosis not present

## 2022-12-30 DIAGNOSIS — E119 Type 2 diabetes mellitus without complications: Secondary | ICD-10-CM | POA: Diagnosis not present

## 2022-12-30 DIAGNOSIS — G309 Alzheimer's disease, unspecified: Secondary | ICD-10-CM | POA: Diagnosis not present

## 2022-12-30 DIAGNOSIS — F0280 Dementia in other diseases classified elsewhere without behavioral disturbance: Secondary | ICD-10-CM | POA: Diagnosis not present

## 2022-12-30 DIAGNOSIS — M84451D Pathological fracture, right femur, subsequent encounter for fracture with routine healing: Secondary | ICD-10-CM | POA: Diagnosis not present

## 2023-01-01 DIAGNOSIS — M1990 Unspecified osteoarthritis, unspecified site: Secondary | ICD-10-CM | POA: Diagnosis not present

## 2023-01-01 DIAGNOSIS — E119 Type 2 diabetes mellitus without complications: Secondary | ICD-10-CM | POA: Diagnosis not present

## 2023-01-01 DIAGNOSIS — G309 Alzheimer's disease, unspecified: Secondary | ICD-10-CM | POA: Diagnosis not present

## 2023-01-01 DIAGNOSIS — F0280 Dementia in other diseases classified elsewhere without behavioral disturbance: Secondary | ICD-10-CM | POA: Diagnosis not present

## 2023-01-01 DIAGNOSIS — M84451D Pathological fracture, right femur, subsequent encounter for fracture with routine healing: Secondary | ICD-10-CM | POA: Diagnosis not present

## 2023-01-01 DIAGNOSIS — I2510 Atherosclerotic heart disease of native coronary artery without angina pectoris: Secondary | ICD-10-CM | POA: Diagnosis not present

## 2023-01-02 DIAGNOSIS — G309 Alzheimer's disease, unspecified: Secondary | ICD-10-CM | POA: Diagnosis not present

## 2023-01-02 DIAGNOSIS — F0280 Dementia in other diseases classified elsewhere without behavioral disturbance: Secondary | ICD-10-CM | POA: Diagnosis not present

## 2023-01-02 DIAGNOSIS — M1990 Unspecified osteoarthritis, unspecified site: Secondary | ICD-10-CM | POA: Diagnosis not present

## 2023-01-02 DIAGNOSIS — M84451D Pathological fracture, right femur, subsequent encounter for fracture with routine healing: Secondary | ICD-10-CM | POA: Diagnosis not present

## 2023-01-02 DIAGNOSIS — E119 Type 2 diabetes mellitus without complications: Secondary | ICD-10-CM | POA: Diagnosis not present

## 2023-01-02 DIAGNOSIS — I2510 Atherosclerotic heart disease of native coronary artery without angina pectoris: Secondary | ICD-10-CM | POA: Diagnosis not present

## 2023-01-03 ENCOUNTER — Encounter: Payer: Self-pay | Admitting: Nurse Practitioner

## 2023-01-05 ENCOUNTER — Telehealth: Payer: Self-pay

## 2023-01-05 NOTE — Telephone Encounter (Signed)
Gave verbal order to connie from [facility] [phone] as per alyssa to use sliver alginate to due to she see black tissue on wound and also hip abrasion trap moisture and  as per nurse she good  [facility] advised her  to make video call appt

## 2023-01-06 DIAGNOSIS — I2510 Atherosclerotic heart disease of native coronary artery without angina pectoris: Secondary | ICD-10-CM | POA: Diagnosis not present

## 2023-01-06 DIAGNOSIS — F0280 Dementia in other diseases classified elsewhere without behavioral disturbance: Secondary | ICD-10-CM | POA: Diagnosis not present

## 2023-01-06 DIAGNOSIS — E119 Type 2 diabetes mellitus without complications: Secondary | ICD-10-CM | POA: Diagnosis not present

## 2023-01-06 DIAGNOSIS — M1990 Unspecified osteoarthritis, unspecified site: Secondary | ICD-10-CM | POA: Diagnosis not present

## 2023-01-06 DIAGNOSIS — M84451D Pathological fracture, right femur, subsequent encounter for fracture with routine healing: Secondary | ICD-10-CM | POA: Diagnosis not present

## 2023-01-06 DIAGNOSIS — G309 Alzheimer's disease, unspecified: Secondary | ICD-10-CM | POA: Diagnosis not present

## 2023-01-07 ENCOUNTER — Telehealth: Payer: Self-pay

## 2023-01-07 DIAGNOSIS — E119 Type 2 diabetes mellitus without complications: Secondary | ICD-10-CM | POA: Diagnosis not present

## 2023-01-07 DIAGNOSIS — M1990 Unspecified osteoarthritis, unspecified site: Secondary | ICD-10-CM | POA: Diagnosis not present

## 2023-01-07 DIAGNOSIS — G309 Alzheimer's disease, unspecified: Secondary | ICD-10-CM | POA: Diagnosis not present

## 2023-01-07 DIAGNOSIS — F0280 Dementia in other diseases classified elsewhere without behavioral disturbance: Secondary | ICD-10-CM | POA: Diagnosis not present

## 2023-01-07 DIAGNOSIS — I2510 Atherosclerotic heart disease of native coronary artery without angina pectoris: Secondary | ICD-10-CM | POA: Diagnosis not present

## 2023-01-07 DIAGNOSIS — M84451D Pathological fracture, right femur, subsequent encounter for fracture with routine healing: Secondary | ICD-10-CM | POA: Diagnosis not present

## 2023-01-07 NOTE — Telephone Encounter (Signed)
Gave verbal order to [facility] [phone] for nursing for ulcer dressing hydrofera dressing twice a week for 4 weeks

## 2023-01-09 ENCOUNTER — Other Ambulatory Visit: Payer: Self-pay | Admitting: Internal Medicine

## 2023-01-09 DIAGNOSIS — M1A9XX0 Chronic gout, unspecified, without tophus (tophi): Secondary | ICD-10-CM

## 2023-01-09 DIAGNOSIS — E782 Mixed hyperlipidemia: Secondary | ICD-10-CM

## 2023-01-10 DIAGNOSIS — Z79891 Long term (current) use of opiate analgesic: Secondary | ICD-10-CM | POA: Diagnosis not present

## 2023-01-10 DIAGNOSIS — E785 Hyperlipidemia, unspecified: Secondary | ICD-10-CM | POA: Diagnosis not present

## 2023-01-10 DIAGNOSIS — E119 Type 2 diabetes mellitus without complications: Secondary | ICD-10-CM | POA: Diagnosis not present

## 2023-01-10 DIAGNOSIS — Z85038 Personal history of other malignant neoplasm of large intestine: Secondary | ICD-10-CM | POA: Diagnosis not present

## 2023-01-10 DIAGNOSIS — M1990 Unspecified osteoarthritis, unspecified site: Secondary | ICD-10-CM | POA: Diagnosis not present

## 2023-01-10 DIAGNOSIS — F0280 Dementia in other diseases classified elsewhere without behavioral disturbance: Secondary | ICD-10-CM | POA: Diagnosis not present

## 2023-01-10 DIAGNOSIS — M84451D Pathological fracture, right femur, subsequent encounter for fracture with routine healing: Secondary | ICD-10-CM | POA: Diagnosis not present

## 2023-01-10 DIAGNOSIS — I10 Essential (primary) hypertension: Secondary | ICD-10-CM | POA: Diagnosis not present

## 2023-01-10 DIAGNOSIS — G309 Alzheimer's disease, unspecified: Secondary | ICD-10-CM | POA: Diagnosis not present

## 2023-01-10 DIAGNOSIS — N390 Urinary tract infection, site not specified: Secondary | ICD-10-CM | POA: Diagnosis not present

## 2023-01-10 DIAGNOSIS — I2510 Atherosclerotic heart disease of native coronary artery without angina pectoris: Secondary | ICD-10-CM | POA: Diagnosis not present

## 2023-01-10 DIAGNOSIS — Z87891 Personal history of nicotine dependence: Secondary | ICD-10-CM | POA: Diagnosis not present

## 2023-01-10 DIAGNOSIS — S72012D Unspecified intracapsular fracture of left femur, subsequent encounter for closed fracture with routine healing: Secondary | ICD-10-CM | POA: Diagnosis not present

## 2023-01-10 DIAGNOSIS — Z7982 Long term (current) use of aspirin: Secondary | ICD-10-CM | POA: Diagnosis not present

## 2023-01-10 DIAGNOSIS — E039 Hypothyroidism, unspecified: Secondary | ICD-10-CM | POA: Diagnosis not present

## 2023-01-13 DIAGNOSIS — F0280 Dementia in other diseases classified elsewhere without behavioral disturbance: Secondary | ICD-10-CM | POA: Diagnosis not present

## 2023-01-13 DIAGNOSIS — M84451D Pathological fracture, right femur, subsequent encounter for fracture with routine healing: Secondary | ICD-10-CM | POA: Diagnosis not present

## 2023-01-13 DIAGNOSIS — I2510 Atherosclerotic heart disease of native coronary artery without angina pectoris: Secondary | ICD-10-CM | POA: Diagnosis not present

## 2023-01-13 DIAGNOSIS — G309 Alzheimer's disease, unspecified: Secondary | ICD-10-CM | POA: Diagnosis not present

## 2023-01-13 DIAGNOSIS — E119 Type 2 diabetes mellitus without complications: Secondary | ICD-10-CM | POA: Diagnosis not present

## 2023-01-13 DIAGNOSIS — M1990 Unspecified osteoarthritis, unspecified site: Secondary | ICD-10-CM | POA: Diagnosis not present

## 2023-01-14 DIAGNOSIS — E119 Type 2 diabetes mellitus without complications: Secondary | ICD-10-CM | POA: Diagnosis not present

## 2023-01-14 DIAGNOSIS — I2510 Atherosclerotic heart disease of native coronary artery without angina pectoris: Secondary | ICD-10-CM | POA: Diagnosis not present

## 2023-01-14 DIAGNOSIS — F0280 Dementia in other diseases classified elsewhere without behavioral disturbance: Secondary | ICD-10-CM | POA: Diagnosis not present

## 2023-01-14 DIAGNOSIS — M84451D Pathological fracture, right femur, subsequent encounter for fracture with routine healing: Secondary | ICD-10-CM | POA: Diagnosis not present

## 2023-01-14 DIAGNOSIS — M1990 Unspecified osteoarthritis, unspecified site: Secondary | ICD-10-CM | POA: Diagnosis not present

## 2023-01-14 DIAGNOSIS — G309 Alzheimer's disease, unspecified: Secondary | ICD-10-CM | POA: Diagnosis not present

## 2023-01-15 DIAGNOSIS — M84451D Pathological fracture, right femur, subsequent encounter for fracture with routine healing: Secondary | ICD-10-CM | POA: Diagnosis not present

## 2023-01-15 DIAGNOSIS — M1990 Unspecified osteoarthritis, unspecified site: Secondary | ICD-10-CM | POA: Diagnosis not present

## 2023-01-15 DIAGNOSIS — E119 Type 2 diabetes mellitus without complications: Secondary | ICD-10-CM | POA: Diagnosis not present

## 2023-01-15 DIAGNOSIS — G309 Alzheimer's disease, unspecified: Secondary | ICD-10-CM | POA: Diagnosis not present

## 2023-01-15 DIAGNOSIS — I2510 Atherosclerotic heart disease of native coronary artery without angina pectoris: Secondary | ICD-10-CM | POA: Diagnosis not present

## 2023-01-15 DIAGNOSIS — F0280 Dementia in other diseases classified elsewhere without behavioral disturbance: Secondary | ICD-10-CM | POA: Diagnosis not present

## 2023-01-19 ENCOUNTER — Telehealth (INDEPENDENT_AMBULATORY_CARE_PROVIDER_SITE_OTHER): Payer: Medicare Other | Admitting: Nurse Practitioner

## 2023-01-19 ENCOUNTER — Encounter: Payer: Self-pay | Admitting: Nurse Practitioner

## 2023-01-19 VITALS — Ht 60.0 in | Wt 110.0 lb

## 2023-01-19 DIAGNOSIS — L89323 Pressure ulcer of left buttock, stage 3: Secondary | ICD-10-CM

## 2023-01-19 DIAGNOSIS — R262 Difficulty in walking, not elsewhere classified: Secondary | ICD-10-CM | POA: Diagnosis not present

## 2023-01-19 DIAGNOSIS — R6889 Other general symptoms and signs: Secondary | ICD-10-CM

## 2023-01-19 DIAGNOSIS — L8990 Pressure ulcer of unspecified site, unspecified stage: Secondary | ICD-10-CM | POA: Diagnosis not present

## 2023-01-19 DIAGNOSIS — S72011D Unspecified intracapsular fracture of right femur, subsequent encounter for closed fracture with routine healing: Secondary | ICD-10-CM

## 2023-01-19 DIAGNOSIS — F0150 Vascular dementia without behavioral disturbance: Secondary | ICD-10-CM | POA: Diagnosis not present

## 2023-01-19 NOTE — Progress Notes (Signed)
[facility] [address] [place], [place] [address]  Internal MEDICINE  Telephone Visit  Patient Name: Maria Sanchez  [id number]  [id number]  Date of Service: 01/19/2023  I connected with the patient at 1630 by telephone and verified the patients identity using two identifiers.   I discussed the limitations, risks, security and privacy concerns of performing an evaluation and management service by telephone and the availability of in person appointments. I also discussed with the patient that there may be a patient responsible charge related to the service.  The patient expressed understanding and agrees to proceed.    Chief Complaint  Patient presents with   Telephone Screen      [phone]     Telephone Assessment    Hospital bed     HPI Silah presents for a telehealth virtual visit for the need for a hospital bed a manual wheelchair. The patient has multi-infarct dementia, a closed subcapital fracture of the neck of the right femur, multiple pressure ulcers, ambulatory dysfunction and the inability to stand. She has a total self-care deficit. She requires assistance 24/7 for her ADLs including but not limited to bathing, grooming, eating, dressing and toileting. Having a hospital bed and a manual wheelchair will help her caregivers with moving her in the bed, and transferring her in and out of the bed to the wheelchair.   Skin Ulcer: Patient complains of skin ulcer. The ulcer is located on the left buttocks. Ulcer has been present several days. Pain is rated 5/10. Interventions to date: dressing is changed daily, it is packed with a would care treatment that will help to heal the wound from the inside out. And nursing comes out to the patient's home twice a week to assess the wound. Patient denies tobacco use. Patient does not have a history of diabetes. The pressure ulcer is a stage 3 and is a large reason as to why the patient needs a hospital bed with alternating  pressure pad.            Current Medication: Outpatient Encounter Medications as of 01/19/2023  Medication Sig   allopurinol (ZYLOPRIM) 100 MG tablet TAKE 2 TABLETS BY MOUTH AT BEDTIME AS NEEDED FOR GOUT   amLODipine (NORVASC) 5 MG tablet TAKE (1) TABLET BY MOUTH EVERY DAY   aspirin EC 81 MG tablet Take 81 mg daily by mouth.   cholecalciferol (VITAMIN D3) 25 MCG (1000 UNIT) tablet Take 1,000 Units by mouth daily.   escitalopram (LEXAPRO) 10 MG tablet TAKE (1) TABLET BY MOUTH EVERY DAY WITH SUPPER   hydrALAZINE (APRESOLINE) 25 MG tablet TAKE 1 TABLET BY MOUTH 3 TIMES A DAY FOR BLOOD PRESSURE   levothyroxine (SYNTHROID) 75 MCG tablet TAKE (1) TABLET BY MOUTH EVERY DAY   mupirocin ointment (BACTROBAN) 2 % Apply 1 Application topically 2 (two) times daily. To open wounds on upper extremities until healed.   pravastatin (PRAVACHOL) 10 MG tablet TAKE 1 TABLET BY MOUTH AT BEDTIME.   risperiDONE (RISPERDAL) 0.5 MG tablet Take 1 tablet (0.5 mg total) by mouth 2 (two) times daily. Takes one tablet in the morning and one tablet at night   No facility-administered encounter medications on file as of 01/19/2023.    Surgical History: Past Surgical History:  Procedure Laterality Date   ABDOMINAL HYSTERECTOMY     BREAST SURGERY     benign   CHOLECYSTECTOMY     CORONARY ARTERY BYPASS GRAFT     exicision salivary gland  MITRAL VALVE REPLACEMENT (MVR)/CORONARY ARTERY BYPASS GRAFTING (CABG)     posterolateral fusion     stent placed in kidney Right    thumb arthroplasty  06/20/2005    Medical History: Past Medical History:  Diagnosis Date   Arthritis    CAD (coronary artery disease)    Dementia (HCC)    Hyperlipidemia    Hypertension    Renal artery stenosis (HCC)    Thyroid disease     Family History: Family History  Problem Relation Age of Onset   Colon cancer Mother    Breast cancer Sister    Colon cancer Brother     Social History   Socioeconomic History   Marital  status: Widowed    Spouse name: Not on file   Number of children: Not on file   Years of education: Not on file   Highest education level: Not on file  Occupational History   Not on file  Tobacco Use   Smoking status: Never   Smokeless tobacco: Never  Vaping Use   Vaping status: Never Used  Substance and Sexual Activity   Alcohol use: No   Drug use: No   Sexual activity: Never  Other Topics Concern   Not on file  Social History Narrative   Not on file   Social Determinants of Health   Financial Resource Strain: Not on file  Food Insecurity: No Food Insecurity (11/14/2022)   Hunger Vital Sign    Worried About Running Out of Food in the Last Year: Never true    Ran Out of Food in the Last Year: Never true  Transportation Needs: Not on file  Physical Activity: Not on file  Stress: Not on file  Social Connections: Not on file  Intimate Partner Violence: Not on file      Review of Systems  Constitutional:  Positive for activity change (bed bound and transfer to and from wheelchair with assistance.) and fatigue. Negative for chills and unexpected weight change.  HENT:  Negative for congestion, postnasal drip, rhinorrhea, sneezing and sore throat.   Eyes:  Negative for redness.  Respiratory: Negative.  Negative for cough, chest tightness, shortness of breath and wheezing.   Cardiovascular: Negative.  Negative for chest pain and palpitations.  Gastrointestinal:  Negative for abdominal pain, constipation, diarrhea, nausea and vomiting.  Genitourinary:  Negative for dysuria and frequency.  Musculoskeletal:  Positive for gait problem and myalgias. Negative for arthralgias, back pain, joint swelling and neck pain.  Skin:  Negative for rash.  Neurological:  Positive for weakness. Negative for tremors and numbness.  Hematological:  Negative for adenopathy. Does not bruise/bleed easily.  Psychiatric/Behavioral:  Negative for behavioral problems (Depression), sleep disturbance and  suicidal ideas. The patient is not nervous/anxious.     Vital Signs: Ht 5' (1.524 m)   Wt 110 lb (49.9 kg)   BMI 21.48 kg/m    Observation/Objective: She is at her baseline, laying in recliner at home quit. I was able to assess the ulcer of the buttocks over the video and the ulcer is a stage 3 on the left buttock. Home health nursing is working on the wound and her caregivers are also keeping it clean and changing the dressings.    Assessment/Plan: 1. Pressure injury of left buttock, stage 3 [facility]) Hospital bed and manual wheelchair ordered  - For home use only DME Hospital bed - DME Wheelchair manual  2. Closed subcapital fracture of right femur with routine healing, subsequent encounter Hospital bed and  manual wheelchair ordered  - For home use only DME Hospital bed - DME Wheelchair manual  3. Total self-care deficit Hospital bed and manual wheelchair ordered  - For home use only DME Hospital bed - DME Wheelchair manual  4. Ambulatory dysfunction Hospital bed and manual wheelchair ordered  - For home use only DME Hospital bed - DME Wheelchair manual  5. Unable to walk Hospital bed and manual wheelchair ordered  - For home use only DME Hospital bed - DME Wheelchair manual  6. Pressure ulcers of skin of multiple topographic sites Hospital bed and manual wheelchair ordered  - For home use only DME Hospital bed - DME Wheelchair manual  7. Multi-infarct dementia without behavioral disturbance [facility]) Hospital bed and manual wheelchair ordered  - For home use only DME Hospital bed - DME Wheelchair manual   General Counseling: nelsie berke understanding of the findings of today's phone visit and agrees with plan of treatment. I have discussed any further diagnostic evaluation that may be needed or ordered today. We also reviewed her medications today. she has been encouraged to call the office with any questions or concerns that should arise related to todays  visit.  Return if symptoms worsen or fail to improve.   Orders Placed This Encounter  Procedures   For home use only DME Hospital bed   DME Wheelchair manual    No orders of the defined types were placed in this encounter.   Time spent:10 Minutes Time spent with patient included reviewing progress notes, labs, imaging studies, and discussing plan for follow up.  [place] Controlled Substance Database was reviewed by me for overdose risk score (ORS) if appropriate.  This patient was seen by Sallyanne Kuster, FNP-C in collaboration with Dr. Beverely Risen as a part of collaborative care agreement.  Zeph Riebel R. Tedd Sias, MSN, FNP-C Internal medicine

## 2023-01-20 DIAGNOSIS — F0280 Dementia in other diseases classified elsewhere without behavioral disturbance: Secondary | ICD-10-CM | POA: Diagnosis not present

## 2023-01-20 DIAGNOSIS — G309 Alzheimer's disease, unspecified: Secondary | ICD-10-CM | POA: Diagnosis not present

## 2023-01-20 DIAGNOSIS — M84451D Pathological fracture, right femur, subsequent encounter for fracture with routine healing: Secondary | ICD-10-CM | POA: Diagnosis not present

## 2023-01-20 DIAGNOSIS — E119 Type 2 diabetes mellitus without complications: Secondary | ICD-10-CM | POA: Diagnosis not present

## 2023-01-20 DIAGNOSIS — M1990 Unspecified osteoarthritis, unspecified site: Secondary | ICD-10-CM | POA: Diagnosis not present

## 2023-01-20 DIAGNOSIS — I2510 Atherosclerotic heart disease of native coronary artery without angina pectoris: Secondary | ICD-10-CM | POA: Diagnosis not present

## 2023-01-27 DIAGNOSIS — I2510 Atherosclerotic heart disease of native coronary artery without angina pectoris: Secondary | ICD-10-CM | POA: Diagnosis not present

## 2023-01-27 DIAGNOSIS — E119 Type 2 diabetes mellitus without complications: Secondary | ICD-10-CM | POA: Diagnosis not present

## 2023-01-27 DIAGNOSIS — G309 Alzheimer's disease, unspecified: Secondary | ICD-10-CM | POA: Diagnosis not present

## 2023-01-27 DIAGNOSIS — F0280 Dementia in other diseases classified elsewhere without behavioral disturbance: Secondary | ICD-10-CM | POA: Diagnosis not present

## 2023-01-27 DIAGNOSIS — M1990 Unspecified osteoarthritis, unspecified site: Secondary | ICD-10-CM | POA: Diagnosis not present

## 2023-01-27 DIAGNOSIS — M84451D Pathological fracture, right femur, subsequent encounter for fracture with routine healing: Secondary | ICD-10-CM | POA: Diagnosis not present

## 2023-01-28 ENCOUNTER — Telehealth: Payer: Self-pay

## 2023-01-28 NOTE — Telephone Encounter (Signed)
Send message to [facility] for hospital bed an wheelchair

## 2023-01-29 DIAGNOSIS — E119 Type 2 diabetes mellitus without complications: Secondary | ICD-10-CM | POA: Diagnosis not present

## 2023-01-29 DIAGNOSIS — F0280 Dementia in other diseases classified elsewhere without behavioral disturbance: Secondary | ICD-10-CM | POA: Diagnosis not present

## 2023-01-29 DIAGNOSIS — G309 Alzheimer's disease, unspecified: Secondary | ICD-10-CM | POA: Diagnosis not present

## 2023-01-29 DIAGNOSIS — M84451D Pathological fracture, right femur, subsequent encounter for fracture with routine healing: Secondary | ICD-10-CM | POA: Diagnosis not present

## 2023-01-29 DIAGNOSIS — M1990 Unspecified osteoarthritis, unspecified site: Secondary | ICD-10-CM | POA: Diagnosis not present

## 2023-01-29 DIAGNOSIS — I2510 Atherosclerotic heart disease of native coronary artery without angina pectoris: Secondary | ICD-10-CM | POA: Diagnosis not present

## 2023-01-30 ENCOUNTER — Encounter: Payer: Self-pay | Admitting: Internal Medicine

## 2023-01-30 ENCOUNTER — Telehealth: Payer: Self-pay

## 2023-01-30 MED ORDER — CIPROFLOXACIN HCL 500 MG PO TABS: 500.0000 mg | ORAL_TABLET | Freq: Two times a day (BID) | ORAL | 0 refills | Status: AC

## 2023-02-02 NOTE — Telephone Encounter (Signed)
Spoke with pt daughter she already start her on antibiotic

## 2023-02-03 DIAGNOSIS — F0280 Dementia in other diseases classified elsewhere without behavioral disturbance: Secondary | ICD-10-CM | POA: Diagnosis not present

## 2023-02-03 DIAGNOSIS — M1990 Unspecified osteoarthritis, unspecified site: Secondary | ICD-10-CM | POA: Diagnosis not present

## 2023-02-03 DIAGNOSIS — G309 Alzheimer's disease, unspecified: Secondary | ICD-10-CM | POA: Diagnosis not present

## 2023-02-03 DIAGNOSIS — I2510 Atherosclerotic heart disease of native coronary artery without angina pectoris: Secondary | ICD-10-CM | POA: Diagnosis not present

## 2023-02-03 DIAGNOSIS — M84451D Pathological fracture, right femur, subsequent encounter for fracture with routine healing: Secondary | ICD-10-CM | POA: Diagnosis not present

## 2023-02-03 DIAGNOSIS — E119 Type 2 diabetes mellitus without complications: Secondary | ICD-10-CM | POA: Diagnosis not present

## 2023-02-05 DIAGNOSIS — G309 Alzheimer's disease, unspecified: Secondary | ICD-10-CM | POA: Diagnosis not present

## 2023-02-05 DIAGNOSIS — F0280 Dementia in other diseases classified elsewhere without behavioral disturbance: Secondary | ICD-10-CM | POA: Diagnosis not present

## 2023-02-05 DIAGNOSIS — M84451D Pathological fracture, right femur, subsequent encounter for fracture with routine healing: Secondary | ICD-10-CM | POA: Diagnosis not present

## 2023-02-05 DIAGNOSIS — I2510 Atherosclerotic heart disease of native coronary artery without angina pectoris: Secondary | ICD-10-CM | POA: Diagnosis not present

## 2023-02-05 DIAGNOSIS — E119 Type 2 diabetes mellitus without complications: Secondary | ICD-10-CM | POA: Diagnosis not present

## 2023-02-05 DIAGNOSIS — M1990 Unspecified osteoarthritis, unspecified site: Secondary | ICD-10-CM | POA: Diagnosis not present

## 2023-02-09 ENCOUNTER — Other Ambulatory Visit: Payer: Self-pay | Admitting: Internal Medicine

## 2023-02-09 DIAGNOSIS — I10 Essential (primary) hypertension: Secondary | ICD-10-CM | POA: Diagnosis not present

## 2023-02-09 DIAGNOSIS — E785 Hyperlipidemia, unspecified: Secondary | ICD-10-CM | POA: Diagnosis not present

## 2023-02-09 DIAGNOSIS — G309 Alzheimer's disease, unspecified: Secondary | ICD-10-CM | POA: Diagnosis not present

## 2023-02-09 DIAGNOSIS — I2510 Atherosclerotic heart disease of native coronary artery without angina pectoris: Secondary | ICD-10-CM | POA: Diagnosis not present

## 2023-02-09 DIAGNOSIS — I701 Atherosclerosis of renal artery: Secondary | ICD-10-CM | POA: Diagnosis not present

## 2023-02-09 DIAGNOSIS — F0280 Dementia in other diseases classified elsewhere without behavioral disturbance: Secondary | ICD-10-CM | POA: Diagnosis not present

## 2023-02-09 DIAGNOSIS — Z87440 Personal history of urinary (tract) infections: Secondary | ICD-10-CM | POA: Diagnosis not present

## 2023-02-09 DIAGNOSIS — Z79899 Other long term (current) drug therapy: Secondary | ICD-10-CM | POA: Diagnosis not present

## 2023-02-09 DIAGNOSIS — L89220 Pressure ulcer of left hip, unstageable: Secondary | ICD-10-CM | POA: Diagnosis not present

## 2023-02-09 DIAGNOSIS — Z7982 Long term (current) use of aspirin: Secondary | ICD-10-CM | POA: Diagnosis not present

## 2023-02-09 DIAGNOSIS — M84451D Pathological fracture, right femur, subsequent encounter for fracture with routine healing: Secondary | ICD-10-CM | POA: Diagnosis not present

## 2023-02-09 DIAGNOSIS — E1136 Type 2 diabetes mellitus with diabetic cataract: Secondary | ICD-10-CM | POA: Diagnosis not present

## 2023-02-09 DIAGNOSIS — M1990 Unspecified osteoarthritis, unspecified site: Secondary | ICD-10-CM | POA: Diagnosis not present

## 2023-02-09 DIAGNOSIS — E039 Hypothyroidism, unspecified: Secondary | ICD-10-CM | POA: Diagnosis not present

## 2023-02-09 DIAGNOSIS — Z993 Dependence on wheelchair: Secondary | ICD-10-CM | POA: Diagnosis not present

## 2023-02-09 DIAGNOSIS — Z85038 Personal history of other malignant neoplasm of large intestine: Secondary | ICD-10-CM | POA: Diagnosis not present

## 2023-02-09 DIAGNOSIS — Z87891 Personal history of nicotine dependence: Secondary | ICD-10-CM | POA: Diagnosis not present

## 2023-02-09 DIAGNOSIS — L89154 Pressure ulcer of sacral region, stage 4: Secondary | ICD-10-CM | POA: Diagnosis not present

## 2023-02-10 DIAGNOSIS — L89220 Pressure ulcer of left hip, unstageable: Secondary | ICD-10-CM | POA: Diagnosis not present

## 2023-02-10 DIAGNOSIS — E1136 Type 2 diabetes mellitus with diabetic cataract: Secondary | ICD-10-CM | POA: Diagnosis not present

## 2023-02-10 DIAGNOSIS — I701 Atherosclerosis of renal artery: Secondary | ICD-10-CM | POA: Diagnosis not present

## 2023-02-10 DIAGNOSIS — F0280 Dementia in other diseases classified elsewhere without behavioral disturbance: Secondary | ICD-10-CM | POA: Diagnosis not present

## 2023-02-10 DIAGNOSIS — G309 Alzheimer's disease, unspecified: Secondary | ICD-10-CM | POA: Diagnosis not present

## 2023-02-10 DIAGNOSIS — L89154 Pressure ulcer of sacral region, stage 4: Secondary | ICD-10-CM | POA: Diagnosis not present

## 2023-02-13 DIAGNOSIS — L89154 Pressure ulcer of sacral region, stage 4: Secondary | ICD-10-CM | POA: Diagnosis not present

## 2023-02-13 DIAGNOSIS — E1136 Type 2 diabetes mellitus with diabetic cataract: Secondary | ICD-10-CM | POA: Diagnosis not present

## 2023-02-13 DIAGNOSIS — F0280 Dementia in other diseases classified elsewhere without behavioral disturbance: Secondary | ICD-10-CM | POA: Diagnosis not present

## 2023-02-13 DIAGNOSIS — G309 Alzheimer's disease, unspecified: Secondary | ICD-10-CM | POA: Diagnosis not present

## 2023-02-13 DIAGNOSIS — I701 Atherosclerosis of renal artery: Secondary | ICD-10-CM | POA: Diagnosis not present

## 2023-02-13 DIAGNOSIS — L89220 Pressure ulcer of left hip, unstageable: Secondary | ICD-10-CM | POA: Diagnosis not present

## 2023-02-18 DIAGNOSIS — L89220 Pressure ulcer of left hip, unstageable: Secondary | ICD-10-CM | POA: Diagnosis not present

## 2023-02-18 DIAGNOSIS — L89154 Pressure ulcer of sacral region, stage 4: Secondary | ICD-10-CM | POA: Diagnosis not present

## 2023-02-18 DIAGNOSIS — I701 Atherosclerosis of renal artery: Secondary | ICD-10-CM | POA: Diagnosis not present

## 2023-02-18 DIAGNOSIS — E1136 Type 2 diabetes mellitus with diabetic cataract: Secondary | ICD-10-CM | POA: Diagnosis not present

## 2023-02-18 DIAGNOSIS — G309 Alzheimer's disease, unspecified: Secondary | ICD-10-CM | POA: Diagnosis not present

## 2023-02-18 DIAGNOSIS — F0280 Dementia in other diseases classified elsewhere without behavioral disturbance: Secondary | ICD-10-CM | POA: Diagnosis not present

## 2023-02-20 DIAGNOSIS — F0280 Dementia in other diseases classified elsewhere without behavioral disturbance: Secondary | ICD-10-CM | POA: Diagnosis not present

## 2023-02-20 DIAGNOSIS — I701 Atherosclerosis of renal artery: Secondary | ICD-10-CM | POA: Diagnosis not present

## 2023-02-20 DIAGNOSIS — L89220 Pressure ulcer of left hip, unstageable: Secondary | ICD-10-CM | POA: Diagnosis not present

## 2023-02-20 DIAGNOSIS — L89154 Pressure ulcer of sacral region, stage 4: Secondary | ICD-10-CM | POA: Diagnosis not present

## 2023-02-20 DIAGNOSIS — G309 Alzheimer's disease, unspecified: Secondary | ICD-10-CM | POA: Diagnosis not present

## 2023-02-20 DIAGNOSIS — E1136 Type 2 diabetes mellitus with diabetic cataract: Secondary | ICD-10-CM | POA: Diagnosis not present

## 2023-02-24 DIAGNOSIS — L89154 Pressure ulcer of sacral region, stage 4: Secondary | ICD-10-CM | POA: Diagnosis not present

## 2023-02-24 DIAGNOSIS — I701 Atherosclerosis of renal artery: Secondary | ICD-10-CM | POA: Diagnosis not present

## 2023-02-24 DIAGNOSIS — L89220 Pressure ulcer of left hip, unstageable: Secondary | ICD-10-CM | POA: Diagnosis not present

## 2023-02-24 DIAGNOSIS — F0280 Dementia in other diseases classified elsewhere without behavioral disturbance: Secondary | ICD-10-CM | POA: Diagnosis not present

## 2023-02-24 DIAGNOSIS — E1136 Type 2 diabetes mellitus with diabetic cataract: Secondary | ICD-10-CM | POA: Diagnosis not present

## 2023-02-24 DIAGNOSIS — G309 Alzheimer's disease, unspecified: Secondary | ICD-10-CM | POA: Diagnosis not present

## 2023-02-27 DIAGNOSIS — F0280 Dementia in other diseases classified elsewhere without behavioral disturbance: Secondary | ICD-10-CM | POA: Diagnosis not present

## 2023-02-27 DIAGNOSIS — I701 Atherosclerosis of renal artery: Secondary | ICD-10-CM | POA: Diagnosis not present

## 2023-02-27 DIAGNOSIS — L89220 Pressure ulcer of left hip, unstageable: Secondary | ICD-10-CM | POA: Diagnosis not present

## 2023-02-27 DIAGNOSIS — E1136 Type 2 diabetes mellitus with diabetic cataract: Secondary | ICD-10-CM | POA: Diagnosis not present

## 2023-02-27 DIAGNOSIS — L89154 Pressure ulcer of sacral region, stage 4: Secondary | ICD-10-CM | POA: Diagnosis not present

## 2023-02-27 DIAGNOSIS — G309 Alzheimer's disease, unspecified: Secondary | ICD-10-CM | POA: Diagnosis not present

## 2023-03-03 DIAGNOSIS — G309 Alzheimer's disease, unspecified: Secondary | ICD-10-CM | POA: Diagnosis not present

## 2023-03-03 DIAGNOSIS — L89154 Pressure ulcer of sacral region, stage 4: Secondary | ICD-10-CM | POA: Diagnosis not present

## 2023-03-03 DIAGNOSIS — F0280 Dementia in other diseases classified elsewhere without behavioral disturbance: Secondary | ICD-10-CM | POA: Diagnosis not present

## 2023-03-03 DIAGNOSIS — L89220 Pressure ulcer of left hip, unstageable: Secondary | ICD-10-CM | POA: Diagnosis not present

## 2023-03-03 DIAGNOSIS — E1136 Type 2 diabetes mellitus with diabetic cataract: Secondary | ICD-10-CM | POA: Diagnosis not present

## 2023-03-03 DIAGNOSIS — I701 Atherosclerosis of renal artery: Secondary | ICD-10-CM | POA: Diagnosis not present

## 2023-03-04 DIAGNOSIS — F03B18 Unspecified dementia, moderate, with other behavioral disturbance: Secondary | ICD-10-CM | POA: Diagnosis not present

## 2023-03-04 DIAGNOSIS — F0280 Dementia in other diseases classified elsewhere without behavioral disturbance: Secondary | ICD-10-CM | POA: Diagnosis not present

## 2023-03-04 DIAGNOSIS — R262 Difficulty in walking, not elsewhere classified: Secondary | ICD-10-CM | POA: Diagnosis not present

## 2023-03-04 DIAGNOSIS — R443 Hallucinations, unspecified: Secondary | ICD-10-CM | POA: Diagnosis not present

## 2023-03-04 DIAGNOSIS — R634 Abnormal weight loss: Secondary | ICD-10-CM | POA: Diagnosis not present

## 2023-03-04 DIAGNOSIS — G309 Alzheimer's disease, unspecified: Secondary | ICD-10-CM | POA: Diagnosis not present

## 2023-03-06 DIAGNOSIS — L89220 Pressure ulcer of left hip, unstageable: Secondary | ICD-10-CM | POA: Diagnosis not present

## 2023-03-06 DIAGNOSIS — L89154 Pressure ulcer of sacral region, stage 4: Secondary | ICD-10-CM | POA: Diagnosis not present

## 2023-03-06 DIAGNOSIS — F0280 Dementia in other diseases classified elsewhere without behavioral disturbance: Secondary | ICD-10-CM | POA: Diagnosis not present

## 2023-03-06 DIAGNOSIS — I701 Atherosclerosis of renal artery: Secondary | ICD-10-CM | POA: Diagnosis not present

## 2023-03-06 DIAGNOSIS — E1136 Type 2 diabetes mellitus with diabetic cataract: Secondary | ICD-10-CM | POA: Diagnosis not present

## 2023-03-06 DIAGNOSIS — G309 Alzheimer's disease, unspecified: Secondary | ICD-10-CM | POA: Diagnosis not present

## 2023-03-10 DIAGNOSIS — L89220 Pressure ulcer of left hip, unstageable: Secondary | ICD-10-CM | POA: Diagnosis not present

## 2023-03-10 DIAGNOSIS — G309 Alzheimer's disease, unspecified: Secondary | ICD-10-CM | POA: Diagnosis not present

## 2023-03-10 DIAGNOSIS — F0280 Dementia in other diseases classified elsewhere without behavioral disturbance: Secondary | ICD-10-CM | POA: Diagnosis not present

## 2023-03-10 DIAGNOSIS — I701 Atherosclerosis of renal artery: Secondary | ICD-10-CM | POA: Diagnosis not present

## 2023-03-10 DIAGNOSIS — E1136 Type 2 diabetes mellitus with diabetic cataract: Secondary | ICD-10-CM | POA: Diagnosis not present

## 2023-03-10 DIAGNOSIS — L89154 Pressure ulcer of sacral region, stage 4: Secondary | ICD-10-CM | POA: Diagnosis not present

## 2023-03-11 DIAGNOSIS — Z87440 Personal history of urinary (tract) infections: Secondary | ICD-10-CM | POA: Diagnosis not present

## 2023-03-11 DIAGNOSIS — G309 Alzheimer's disease, unspecified: Secondary | ICD-10-CM | POA: Diagnosis not present

## 2023-03-11 DIAGNOSIS — I2510 Atherosclerotic heart disease of native coronary artery without angina pectoris: Secondary | ICD-10-CM | POA: Diagnosis not present

## 2023-03-11 DIAGNOSIS — M1990 Unspecified osteoarthritis, unspecified site: Secondary | ICD-10-CM | POA: Diagnosis not present

## 2023-03-11 DIAGNOSIS — E785 Hyperlipidemia, unspecified: Secondary | ICD-10-CM | POA: Diagnosis not present

## 2023-03-11 DIAGNOSIS — E039 Hypothyroidism, unspecified: Secondary | ICD-10-CM | POA: Diagnosis not present

## 2023-03-11 DIAGNOSIS — L89154 Pressure ulcer of sacral region, stage 4: Secondary | ICD-10-CM | POA: Diagnosis not present

## 2023-03-11 DIAGNOSIS — Z993 Dependence on wheelchair: Secondary | ICD-10-CM | POA: Diagnosis not present

## 2023-03-11 DIAGNOSIS — Z87891 Personal history of nicotine dependence: Secondary | ICD-10-CM | POA: Diagnosis not present

## 2023-03-11 DIAGNOSIS — M84451D Pathological fracture, right femur, subsequent encounter for fracture with routine healing: Secondary | ICD-10-CM | POA: Diagnosis not present

## 2023-03-11 DIAGNOSIS — I10 Essential (primary) hypertension: Secondary | ICD-10-CM | POA: Diagnosis not present

## 2023-03-11 DIAGNOSIS — L89220 Pressure ulcer of left hip, unstageable: Secondary | ICD-10-CM | POA: Diagnosis not present

## 2023-03-11 DIAGNOSIS — E1136 Type 2 diabetes mellitus with diabetic cataract: Secondary | ICD-10-CM | POA: Diagnosis not present

## 2023-03-11 DIAGNOSIS — Z7982 Long term (current) use of aspirin: Secondary | ICD-10-CM | POA: Diagnosis not present

## 2023-03-11 DIAGNOSIS — F0280 Dementia in other diseases classified elsewhere without behavioral disturbance: Secondary | ICD-10-CM | POA: Diagnosis not present

## 2023-03-11 DIAGNOSIS — Z85038 Personal history of other malignant neoplasm of large intestine: Secondary | ICD-10-CM | POA: Diagnosis not present

## 2023-03-11 DIAGNOSIS — Z79899 Other long term (current) drug therapy: Secondary | ICD-10-CM | POA: Diagnosis not present

## 2023-03-11 DIAGNOSIS — I701 Atherosclerosis of renal artery: Secondary | ICD-10-CM | POA: Diagnosis not present

## 2023-03-13 DIAGNOSIS — E1136 Type 2 diabetes mellitus with diabetic cataract: Secondary | ICD-10-CM | POA: Diagnosis not present

## 2023-03-13 DIAGNOSIS — L89220 Pressure ulcer of left hip, unstageable: Secondary | ICD-10-CM | POA: Diagnosis not present

## 2023-03-13 DIAGNOSIS — L89154 Pressure ulcer of sacral region, stage 4: Secondary | ICD-10-CM | POA: Diagnosis not present

## 2023-03-13 DIAGNOSIS — F0280 Dementia in other diseases classified elsewhere without behavioral disturbance: Secondary | ICD-10-CM | POA: Diagnosis not present

## 2023-03-13 DIAGNOSIS — I701 Atherosclerosis of renal artery: Secondary | ICD-10-CM | POA: Diagnosis not present

## 2023-03-13 DIAGNOSIS — G309 Alzheimer's disease, unspecified: Secondary | ICD-10-CM | POA: Diagnosis not present

## 2023-03-17 DIAGNOSIS — F0280 Dementia in other diseases classified elsewhere without behavioral disturbance: Secondary | ICD-10-CM | POA: Diagnosis not present

## 2023-03-17 DIAGNOSIS — E1136 Type 2 diabetes mellitus with diabetic cataract: Secondary | ICD-10-CM | POA: Diagnosis not present

## 2023-03-17 DIAGNOSIS — I701 Atherosclerosis of renal artery: Secondary | ICD-10-CM | POA: Diagnosis not present

## 2023-03-17 DIAGNOSIS — G309 Alzheimer's disease, unspecified: Secondary | ICD-10-CM | POA: Diagnosis not present

## 2023-03-17 DIAGNOSIS — L89154 Pressure ulcer of sacral region, stage 4: Secondary | ICD-10-CM | POA: Diagnosis not present

## 2023-03-17 DIAGNOSIS — L89220 Pressure ulcer of left hip, unstageable: Secondary | ICD-10-CM | POA: Diagnosis not present

## 2023-03-19 DIAGNOSIS — G309 Alzheimer's disease, unspecified: Secondary | ICD-10-CM | POA: Diagnosis not present

## 2023-03-19 DIAGNOSIS — L89220 Pressure ulcer of left hip, unstageable: Secondary | ICD-10-CM | POA: Diagnosis not present

## 2023-03-19 DIAGNOSIS — L89154 Pressure ulcer of sacral region, stage 4: Secondary | ICD-10-CM | POA: Diagnosis not present

## 2023-03-19 DIAGNOSIS — F0280 Dementia in other diseases classified elsewhere without behavioral disturbance: Secondary | ICD-10-CM | POA: Diagnosis not present

## 2023-03-19 DIAGNOSIS — I701 Atherosclerosis of renal artery: Secondary | ICD-10-CM | POA: Diagnosis not present

## 2023-03-19 DIAGNOSIS — E1136 Type 2 diabetes mellitus with diabetic cataract: Secondary | ICD-10-CM | POA: Diagnosis not present

## 2023-03-23 ENCOUNTER — Telehealth: Payer: Medicare Other | Admitting: Internal Medicine

## 2023-03-24 ENCOUNTER — Telehealth: Payer: Medicare Other | Admitting: Internal Medicine

## 2023-03-24 DIAGNOSIS — I701 Atherosclerosis of renal artery: Secondary | ICD-10-CM | POA: Diagnosis not present

## 2023-03-24 DIAGNOSIS — F0280 Dementia in other diseases classified elsewhere without behavioral disturbance: Secondary | ICD-10-CM | POA: Diagnosis not present

## 2023-03-24 DIAGNOSIS — L89220 Pressure ulcer of left hip, unstageable: Secondary | ICD-10-CM | POA: Diagnosis not present

## 2023-03-24 DIAGNOSIS — L89154 Pressure ulcer of sacral region, stage 4: Secondary | ICD-10-CM | POA: Diagnosis not present

## 2023-03-24 DIAGNOSIS — E1136 Type 2 diabetes mellitus with diabetic cataract: Secondary | ICD-10-CM | POA: Diagnosis not present

## 2023-03-24 DIAGNOSIS — G309 Alzheimer's disease, unspecified: Secondary | ICD-10-CM | POA: Diagnosis not present

## 2023-03-27 DIAGNOSIS — L89154 Pressure ulcer of sacral region, stage 4: Secondary | ICD-10-CM | POA: Diagnosis not present

## 2023-03-27 DIAGNOSIS — E1136 Type 2 diabetes mellitus with diabetic cataract: Secondary | ICD-10-CM | POA: Diagnosis not present

## 2023-03-27 DIAGNOSIS — F0280 Dementia in other diseases classified elsewhere without behavioral disturbance: Secondary | ICD-10-CM | POA: Diagnosis not present

## 2023-03-27 DIAGNOSIS — L89220 Pressure ulcer of left hip, unstageable: Secondary | ICD-10-CM | POA: Diagnosis not present

## 2023-03-27 DIAGNOSIS — G309 Alzheimer's disease, unspecified: Secondary | ICD-10-CM | POA: Diagnosis not present

## 2023-03-27 DIAGNOSIS — I701 Atherosclerosis of renal artery: Secondary | ICD-10-CM | POA: Diagnosis not present

## 2023-03-31 ENCOUNTER — Telehealth: Payer: Medicare Other | Admitting: Internal Medicine

## 2023-03-31 DIAGNOSIS — G309 Alzheimer's disease, unspecified: Secondary | ICD-10-CM | POA: Diagnosis not present

## 2023-03-31 DIAGNOSIS — F0280 Dementia in other diseases classified elsewhere without behavioral disturbance: Secondary | ICD-10-CM | POA: Diagnosis not present

## 2023-03-31 DIAGNOSIS — L89220 Pressure ulcer of left hip, unstageable: Secondary | ICD-10-CM | POA: Diagnosis not present

## 2023-03-31 DIAGNOSIS — I701 Atherosclerosis of renal artery: Secondary | ICD-10-CM | POA: Diagnosis not present

## 2023-03-31 DIAGNOSIS — E1136 Type 2 diabetes mellitus with diabetic cataract: Secondary | ICD-10-CM | POA: Diagnosis not present

## 2023-03-31 DIAGNOSIS — L89154 Pressure ulcer of sacral region, stage 4: Secondary | ICD-10-CM | POA: Diagnosis not present

## 2023-04-02 DIAGNOSIS — L89220 Pressure ulcer of left hip, unstageable: Secondary | ICD-10-CM | POA: Diagnosis not present

## 2023-04-02 DIAGNOSIS — I701 Atherosclerosis of renal artery: Secondary | ICD-10-CM | POA: Diagnosis not present

## 2023-04-02 DIAGNOSIS — G309 Alzheimer's disease, unspecified: Secondary | ICD-10-CM | POA: Diagnosis not present

## 2023-04-02 DIAGNOSIS — L89154 Pressure ulcer of sacral region, stage 4: Secondary | ICD-10-CM | POA: Diagnosis not present

## 2023-04-02 DIAGNOSIS — F0280 Dementia in other diseases classified elsewhere without behavioral disturbance: Secondary | ICD-10-CM | POA: Diagnosis not present

## 2023-04-02 DIAGNOSIS — E1136 Type 2 diabetes mellitus with diabetic cataract: Secondary | ICD-10-CM | POA: Diagnosis not present

## 2023-04-06 ENCOUNTER — Telehealth: Payer: Self-pay

## 2023-04-06 DIAGNOSIS — I701 Atherosclerosis of renal artery: Secondary | ICD-10-CM | POA: Diagnosis not present

## 2023-04-06 DIAGNOSIS — G309 Alzheimer's disease, unspecified: Secondary | ICD-10-CM | POA: Diagnosis not present

## 2023-04-06 DIAGNOSIS — L89154 Pressure ulcer of sacral region, stage 4: Secondary | ICD-10-CM | POA: Diagnosis not present

## 2023-04-06 DIAGNOSIS — E1136 Type 2 diabetes mellitus with diabetic cataract: Secondary | ICD-10-CM | POA: Diagnosis not present

## 2023-04-06 DIAGNOSIS — L89220 Pressure ulcer of left hip, unstageable: Secondary | ICD-10-CM | POA: Diagnosis not present

## 2023-04-06 DIAGNOSIS — F0280 Dementia in other diseases classified elsewhere without behavioral disturbance: Secondary | ICD-10-CM | POA: Diagnosis not present

## 2023-04-06 NOTE — Telephone Encounter (Signed)
Verlon Au from [facility] called asking for twice a week for 9 weeks for wound care. Verbal orders given. [phone]

## 2023-04-09 DIAGNOSIS — G309 Alzheimer's disease, unspecified: Secondary | ICD-10-CM | POA: Diagnosis not present

## 2023-04-09 DIAGNOSIS — L89154 Pressure ulcer of sacral region, stage 4: Secondary | ICD-10-CM | POA: Diagnosis not present

## 2023-04-09 DIAGNOSIS — F0280 Dementia in other diseases classified elsewhere without behavioral disturbance: Secondary | ICD-10-CM | POA: Diagnosis not present

## 2023-04-09 DIAGNOSIS — I701 Atherosclerosis of renal artery: Secondary | ICD-10-CM | POA: Diagnosis not present

## 2023-04-09 DIAGNOSIS — E1136 Type 2 diabetes mellitus with diabetic cataract: Secondary | ICD-10-CM | POA: Diagnosis not present

## 2023-04-09 DIAGNOSIS — L89220 Pressure ulcer of left hip, unstageable: Secondary | ICD-10-CM | POA: Diagnosis not present

## 2023-04-10 DIAGNOSIS — M84451D Pathological fracture, right femur, subsequent encounter for fracture with routine healing: Secondary | ICD-10-CM | POA: Diagnosis not present

## 2023-04-10 DIAGNOSIS — Z87891 Personal history of nicotine dependence: Secondary | ICD-10-CM | POA: Diagnosis not present

## 2023-04-10 DIAGNOSIS — E039 Hypothyroidism, unspecified: Secondary | ICD-10-CM | POA: Diagnosis not present

## 2023-04-10 DIAGNOSIS — I2510 Atherosclerotic heart disease of native coronary artery without angina pectoris: Secondary | ICD-10-CM | POA: Diagnosis not present

## 2023-04-10 DIAGNOSIS — I10 Essential (primary) hypertension: Secondary | ICD-10-CM | POA: Diagnosis not present

## 2023-04-10 DIAGNOSIS — E1136 Type 2 diabetes mellitus with diabetic cataract: Secondary | ICD-10-CM | POA: Diagnosis not present

## 2023-04-10 DIAGNOSIS — Z85038 Personal history of other malignant neoplasm of large intestine: Secondary | ICD-10-CM | POA: Diagnosis not present

## 2023-04-10 DIAGNOSIS — E785 Hyperlipidemia, unspecified: Secondary | ICD-10-CM | POA: Diagnosis not present

## 2023-04-10 DIAGNOSIS — G309 Alzheimer's disease, unspecified: Secondary | ICD-10-CM | POA: Diagnosis not present

## 2023-04-10 DIAGNOSIS — F0280 Dementia in other diseases classified elsewhere without behavioral disturbance: Secondary | ICD-10-CM | POA: Diagnosis not present

## 2023-04-10 DIAGNOSIS — I701 Atherosclerosis of renal artery: Secondary | ICD-10-CM | POA: Diagnosis not present

## 2023-04-10 DIAGNOSIS — L89220 Pressure ulcer of left hip, unstageable: Secondary | ICD-10-CM | POA: Diagnosis not present

## 2023-04-10 DIAGNOSIS — Z87440 Personal history of urinary (tract) infections: Secondary | ICD-10-CM | POA: Diagnosis not present

## 2023-04-10 DIAGNOSIS — Z993 Dependence on wheelchair: Secondary | ICD-10-CM | POA: Diagnosis not present

## 2023-04-10 DIAGNOSIS — M1990 Unspecified osteoarthritis, unspecified site: Secondary | ICD-10-CM | POA: Diagnosis not present

## 2023-04-10 DIAGNOSIS — Z7982 Long term (current) use of aspirin: Secondary | ICD-10-CM | POA: Diagnosis not present

## 2023-04-10 DIAGNOSIS — L89154 Pressure ulcer of sacral region, stage 4: Secondary | ICD-10-CM | POA: Diagnosis not present

## 2023-04-13 ENCOUNTER — Telehealth (INDEPENDENT_AMBULATORY_CARE_PROVIDER_SITE_OTHER): Payer: Medicare Other | Admitting: Nurse Practitioner

## 2023-04-13 ENCOUNTER — Encounter: Payer: Self-pay | Admitting: Nurse Practitioner

## 2023-04-13 VITALS — BP 148/55 | HR 88 | Resp 16 | Ht 60.0 in | Wt 115.0 lb

## 2023-04-13 DIAGNOSIS — R6889 Other general symptoms and signs: Secondary | ICD-10-CM

## 2023-04-13 DIAGNOSIS — E039 Hypothyroidism, unspecified: Secondary | ICD-10-CM

## 2023-04-13 DIAGNOSIS — G309 Alzheimer's disease, unspecified: Secondary | ICD-10-CM | POA: Diagnosis not present

## 2023-04-13 DIAGNOSIS — Z7401 Bed confinement status: Secondary | ICD-10-CM | POA: Diagnosis not present

## 2023-04-13 DIAGNOSIS — N1832 Chronic kidney disease, stage 3b: Secondary | ICD-10-CM | POA: Diagnosis not present

## 2023-04-13 DIAGNOSIS — F0280 Dementia in other diseases classified elsewhere without behavioral disturbance: Secondary | ICD-10-CM | POA: Diagnosis not present

## 2023-04-13 DIAGNOSIS — L89220 Pressure ulcer of left hip, unstageable: Secondary | ICD-10-CM | POA: Diagnosis not present

## 2023-04-13 DIAGNOSIS — L89154 Pressure ulcer of sacral region, stage 4: Secondary | ICD-10-CM | POA: Diagnosis not present

## 2023-04-13 DIAGNOSIS — R262 Difficulty in walking, not elsewhere classified: Secondary | ICD-10-CM

## 2023-04-13 DIAGNOSIS — M1A9XX0 Chronic gout, unspecified, without tophus (tophi): Secondary | ICD-10-CM

## 2023-04-13 DIAGNOSIS — Z0000 Encounter for general adult medical examination without abnormal findings: Secondary | ICD-10-CM | POA: Diagnosis not present

## 2023-04-13 DIAGNOSIS — E1136 Type 2 diabetes mellitus with diabetic cataract: Secondary | ICD-10-CM | POA: Diagnosis not present

## 2023-04-13 DIAGNOSIS — E782 Mixed hyperlipidemia: Secondary | ICD-10-CM

## 2023-04-13 DIAGNOSIS — I701 Atherosclerosis of renal artery: Secondary | ICD-10-CM | POA: Diagnosis not present

## 2023-04-13 MED ORDER — LEVOTHYROXINE SODIUM 75 MCG PO TABS
75.0000 ug | ORAL_TABLET | Freq: Every day | ORAL | 1 refills | Status: DC
Start: 2023-04-13 — End: 2023-11-27

## 2023-04-13 MED ORDER — ALLOPURINOL 100 MG PO TABS: ORAL_TABLET | ORAL | 1 refills | Status: DC

## 2023-04-13 MED ORDER — PRAVASTATIN SODIUM 10 MG PO TABS
10.0000 mg | ORAL_TABLET | Freq: Every day | ORAL | 5 refills | Status: DC
Start: 2023-04-13 — End: 2023-11-27

## 2023-04-13 NOTE — Progress Notes (Signed)
[facility] [address] [place], [place] [address]  Internal MEDICINE  Office Visit Note  Patient Name: Maria Sanchez  [id number]  [id number]  Date of Service: 04/13/2023  I connected with the patient at 1530 by telephone and verified the patients identity using two identifiers.   I discussed the limitations, risks, security and privacy concerns of performing an evaluation and management service by telephone and the availability of in person appointments. I also discussed with the patient that there may be a patient responsible charge related to the service.  The patient expressed understanding and agrees to proceed.    Chief Complaint  Patient presents with   Telephone Screen    AWV   Telephone Assessment   Medicare Wellness    HPI Tabbie presents for an annual well visit and physical exam.  Well-appearing 89 y.o. female with severe dementia, ambulatory dysfunction, total self care deficit, chronic UTIs, high cholesterol, hypertension, hypothyroidism.  Labs: deferred due to age and bedridden status. Labs will be ordered on a PRN basis.  New or worsening pain: none  Other concerns: none  Bed sores are healing No other screenings due  Medication list is up to date.        04/13/2023    3:20 PM 03/13/2022    1:38 PM 01/31/2021    2:46 PM  MMSE - Mini Mental State Exam  Not completed: Unable to complete Unable to complete   Orientation to time   0  Orientation to Place   0  Registration   0  Attention/ Calculation   0  Recall   3  Language- name 2 objects   0  Language- repeat   0  Language- follow 3 step command   0  Language- read & follow direction   0  Write a sentence   0  Copy design   0  Total score   3    Medicare Risk at Home - 04/13/23 1524     Any stairs in or around the home? No    Home free of loose throw rugs in walkways, pet beds, electrical cords, etc? Yes    Adequate lighting in your home to reduce risk of falls? Yes    Life  alert? No    Use of a cane, walker or w/c? Yes    Grab bars in the bathroom? Yes    Shower chair or bench in shower? No    Elevated toilet seat or a handicapped toilet? No              Functional Status Survey: Is the patient deaf or have difficulty hearing?: Yes Does the patient have difficulty seeing, even when wearing glasses/contacts?: Yes Does the patient have difficulty concentrating, remembering, or making decisions?: Yes Does the patient have difficulty walking or climbing stairs?: Yes Does the patient have difficulty dressing or bathing?: Yes Does the patient have difficulty doing errands alone such as visiting a doctor's office or shopping?: Yes     01/31/2021    2:44 PM 07/30/2021   10:51 AM 03/13/2022    1:37 PM 09/16/2022   10:39 AM 04/13/2023    3:18 PM  Fall Risk  Falls in the past year? 1 1 0 0 0  Was there an injury with Fall? 0 1   0  Fall Risk Category Calculator 1 3   0  Fall Risk Category (Retired) Low High     (RETIRED) Patient Fall Risk Level  High fall  risk     Patient at Risk for Falls Due to  Impaired mobility;Impaired balance/gait;History of fall(s)   No Fall Risks  Fall risk Follow up  Falls evaluation completed   Falls evaluation completed       09/16/2022   10:39 AM  Depression screen PHQ 2/9  Decreased Interest 0  PHQ - 2 Score 0        No data to display            Current Medication: Outpatient Encounter Medications as of 04/13/2023  Medication Sig   amLODipine (NORVASC) 5 MG tablet TAKE (1) TABLET BY MOUTH EVERY DAY   aspirin EC 81 MG tablet Take 81 mg daily by mouth.   cholecalciferol (VITAMIN D3) 25 MCG (1000 UNIT) tablet Take 1,000 Units by mouth daily.   escitalopram (LEXAPRO) 10 MG tablet TAKE (1) TABLET BY MOUTH EVERY DAY WITH SUPPER   hydrALAZINE (APRESOLINE) 25 MG tablet TAKE 1 TABLET BY MOUTH 3 TIMES A DAY FOR BLOOD PRESSURE   mupirocin ointment (BACTROBAN) 2 % Apply 1 Application topically 2 (two) times daily. To  open wounds on upper extremities until healed.   [DISCONTINUED] allopurinol (ZYLOPRIM) 100 MG tablet TAKE 2 TABLETS BY MOUTH AT BEDTIME AS NEEDED FOR GOUT   [DISCONTINUED] levothyroxine (SYNTHROID) 75 MCG tablet TAKE (1) TABLET BY MOUTH EVERY DAY   [DISCONTINUED] pravastatin (PRAVACHOL) 10 MG tablet TAKE 1 TABLET BY MOUTH AT BEDTIME.   allopurinol (ZYLOPRIM) 100 MG tablet TAKE 2 TABLETS BY MOUTH AT BEDTIME AS NEEDED FOR GOUT   levothyroxine (SYNTHROID) 75 MCG tablet Take 1 tablet (75 mcg total) by mouth daily before breakfast.   pravastatin (PRAVACHOL) 10 MG tablet Take 1 tablet (10 mg total) by mouth at bedtime.   risperiDONE (RISPERDAL) 0.5 MG tablet Take 1 tablet (0.5 mg total) by mouth 2 (two) times daily. Takes one tablet in the morning and one tablet at night   No facility-administered encounter medications on file as of 04/13/2023.    Surgical History: Past Surgical History:  Procedure Laterality Date   ABDOMINAL HYSTERECTOMY     BREAST SURGERY     benign   CHOLECYSTECTOMY     CORONARY ARTERY BYPASS GRAFT     exicision salivary gland     MITRAL VALVE REPLACEMENT (MVR)/CORONARY ARTERY BYPASS GRAFTING (CABG)     posterolateral fusion     stent placed in kidney Right    thumb arthroplasty  06/20/2005    Medical History: Past Medical History:  Diagnosis Date   Arthritis    CAD (coronary artery disease)    Dementia (HCC)    Hyperlipidemia    Hypertension    Renal artery stenosis (HCC)    Thyroid disease     Family History: Family History  Problem Relation Age of Onset   Colon cancer Mother    Breast cancer Sister    Colon cancer Brother     Social History   Socioeconomic History   Marital status: Widowed    Spouse name: Not on file   Number of children: Not on file   Years of education: Not on file   Highest education level: Not on file  Occupational History   Not on file  Tobacco Use   Smoking status: Never   Smokeless tobacco: Never  Vaping Use    Vaping status: Never Used  Substance and Sexual Activity   Alcohol use: No   Drug use: No   Sexual activity: Never  Other Topics Concern  Not on file  Social History Narrative   Not on file   Social Drivers of Health   Financial Resource Strain: Not on file  Food Insecurity: No Food Insecurity (11/14/2022)   Hunger Vital Sign    Worried About Running Out of Food in the Last Year: Never true    Ran Out of Food in the Last Year: Never true  Transportation Needs: Not on file  Physical Activity: Not on file  Stress: Not on file  Social Connections: Not on file  Intimate Partner Violence: Not on file      Review of Systems  Constitutional:  Positive for activity change and fatigue. Negative for chills, fever and unexpected weight change.       Bedridden, ambulatory dysfunction and total self-care deficit.   HENT:  Negative for congestion, mouth sores, postnasal drip, rhinorrhea, sneezing and sore throat.   Eyes:  Negative for redness.  Respiratory: Negative.  Negative for cough, chest tightness, shortness of breath and wheezing.   Cardiovascular: Negative.  Negative for chest pain and palpitations.  Gastrointestinal: Negative.  Negative for abdominal pain, constipation, diarrhea, nausea and vomiting.       Incontinence, wears depends.  Genitourinary:  Negative for dysuria, flank pain and frequency.       Incontinence  Musculoskeletal:  Positive for gait problem (unable to walk). Negative for arthralgias, back pain, joint swelling and neck pain.  Skin:  Negative for rash.       Healing bed sores, per patient's daughter, may only need a couple more weeks of home health. She is being turned frequently in the bed and per patient's daughter 24/7 home health aide staff are helping to make sure the patient stays clean and keeps pressure off of her healing wounds.   Neurological:  Negative for tremors and numbness.       Memory loss   Hematological:  Negative for adenopathy. Does not  bruise/bleed easily.  Psychiatric/Behavioral: Negative.  Negative for behavioral problems (Depression), sleep disturbance and suicidal ideas. The patient is not nervous/anxious.   All other systems reviewed and are negative.   Vital Signs: BP (!) 148/55   Pulse 88   Resp 16   Ht 5' (1.524 m)   Wt 115 lb (52.2 kg)   BMI 22.46 kg/m    Objective Data: Patient is awake, at her baseline neurologically. Per patient's daughter, she is content, denies pain and has been having a good week. No acute distress noted.      Assessment/Plan: 1. Encounter for subsequent annual wellness visit (AWV) in Medicare patient (Primary) Age-appropriate preventive screenings and vaccinations discussed with the patient's daughter, Olegario Messier. Routine labs for health maintenance deferred due to age and bedridden status. PHM updated.    2. Acquired hypothyroidism Continue levothyroxine as prescribed.  - levothyroxine (SYNTHROID) 75 MCG tablet; Take 1 tablet (75 mcg total) by mouth daily before breakfast.  Dispense: 90 tablet; Refill: 1  3. Stage 3b chronic kidney disease (HCC) No issues with urination at this time, labs will be monitored on PRN basis due to age.   4. Chronic gout without tophus, unspecified cause, unspecified site Continue allopurinol as prescribed  - allopurinol (ZYLOPRIM) 100 MG tablet; TAKE 2 TABLETS BY MOUTH AT BEDTIME AS NEEDED FOR GOUT  Dispense: 180 tablet; Refill: 1  5. Mixed hyperlipidemia Continue pravastatin as prescribed. - pravastatin (PRAVACHOL) 10 MG tablet; Take 1 tablet (10 mg total) by mouth at bedtime.  Dispense: 30 tablet; Refill: 5  6. Ambulatory dysfunction Bedridden  but has a wheelchair for ambulation which is sometimes used.  7. Total self-care deficit Has hospital bed at home and 24/7 home health aide at home to assist with care   8. Bedridden Has hospital bed at home and 24/7 home health aide at home to assist with care      General Counseling: clancey routon understanding of the findings of todays visit and agrees with plan of treatment. I have discussed any further diagnostic evaluation that may be needed or ordered today. We also reviewed her medications today. she has been encouraged to call the office with any questions or concerns that should arise related to todays visit.    No orders of the defined types were placed in this encounter.   Meds ordered this encounter  Medications   allopurinol (ZYLOPRIM) 100 MG tablet    Sig: TAKE 2 TABLETS BY MOUTH AT BEDTIME AS NEEDED FOR GOUT    Dispense:  180 tablet    Refill:  1    For future refills, keep on file   levothyroxine (SYNTHROID) 75 MCG tablet    Sig: Take 1 tablet (75 mcg total) by mouth daily before breakfast.    Dispense:  90 tablet    Refill:  1    For future refills, keep on file   pravastatin (PRAVACHOL) 10 MG tablet    Sig: Take 1 tablet (10 mg total) by mouth at bedtime.    Dispense:  30 tablet    Refill:  5    For future refills, keep on file    Return in about 1 year (around 04/12/2024) for AWV with Paisyn Guercio or [facility]FK, and otherwise as needed. .   Total time spent:30 Minutes Time spent includes review of chart, medications, test results, and follow up plan with the patient.   [place] Controlled Substance Database was reviewed by me.  This patient was seen by Sallyanne Kuster, FNP-C in collaboration with Dr. Beverely Risen as a part of collaborative care agreement.  Kaylub Detienne R. Tedd Sias, MSN, FNP-C Internal medicine

## 2023-04-16 DIAGNOSIS — L89220 Pressure ulcer of left hip, unstageable: Secondary | ICD-10-CM | POA: Diagnosis not present

## 2023-04-16 DIAGNOSIS — G309 Alzheimer's disease, unspecified: Secondary | ICD-10-CM | POA: Diagnosis not present

## 2023-04-16 DIAGNOSIS — I701 Atherosclerosis of renal artery: Secondary | ICD-10-CM | POA: Diagnosis not present

## 2023-04-16 DIAGNOSIS — F0280 Dementia in other diseases classified elsewhere without behavioral disturbance: Secondary | ICD-10-CM | POA: Diagnosis not present

## 2023-04-16 DIAGNOSIS — L89154 Pressure ulcer of sacral region, stage 4: Secondary | ICD-10-CM | POA: Diagnosis not present

## 2023-04-16 DIAGNOSIS — E1136 Type 2 diabetes mellitus with diabetic cataract: Secondary | ICD-10-CM | POA: Diagnosis not present

## 2023-04-24 DIAGNOSIS — F0280 Dementia in other diseases classified elsewhere without behavioral disturbance: Secondary | ICD-10-CM | POA: Diagnosis not present

## 2023-04-24 DIAGNOSIS — L89154 Pressure ulcer of sacral region, stage 4: Secondary | ICD-10-CM | POA: Diagnosis not present

## 2023-04-24 DIAGNOSIS — E1136 Type 2 diabetes mellitus with diabetic cataract: Secondary | ICD-10-CM | POA: Diagnosis not present

## 2023-04-24 DIAGNOSIS — G309 Alzheimer's disease, unspecified: Secondary | ICD-10-CM | POA: Diagnosis not present

## 2023-04-24 DIAGNOSIS — L89220 Pressure ulcer of left hip, unstageable: Secondary | ICD-10-CM | POA: Diagnosis not present

## 2023-04-24 DIAGNOSIS — I701 Atherosclerosis of renal artery: Secondary | ICD-10-CM | POA: Diagnosis not present

## 2023-04-28 DIAGNOSIS — L89154 Pressure ulcer of sacral region, stage 4: Secondary | ICD-10-CM | POA: Diagnosis not present

## 2023-04-28 DIAGNOSIS — L89220 Pressure ulcer of left hip, unstageable: Secondary | ICD-10-CM | POA: Diagnosis not present

## 2023-04-28 DIAGNOSIS — I701 Atherosclerosis of renal artery: Secondary | ICD-10-CM | POA: Diagnosis not present

## 2023-04-28 DIAGNOSIS — E1136 Type 2 diabetes mellitus with diabetic cataract: Secondary | ICD-10-CM | POA: Diagnosis not present

## 2023-04-28 DIAGNOSIS — F0280 Dementia in other diseases classified elsewhere without behavioral disturbance: Secondary | ICD-10-CM | POA: Diagnosis not present

## 2023-04-28 DIAGNOSIS — G309 Alzheimer's disease, unspecified: Secondary | ICD-10-CM | POA: Diagnosis not present

## 2023-04-30 DIAGNOSIS — F0280 Dementia in other diseases classified elsewhere without behavioral disturbance: Secondary | ICD-10-CM | POA: Diagnosis not present

## 2023-04-30 DIAGNOSIS — L89220 Pressure ulcer of left hip, unstageable: Secondary | ICD-10-CM | POA: Diagnosis not present

## 2023-04-30 DIAGNOSIS — L89154 Pressure ulcer of sacral region, stage 4: Secondary | ICD-10-CM | POA: Diagnosis not present

## 2023-04-30 DIAGNOSIS — G309 Alzheimer's disease, unspecified: Secondary | ICD-10-CM | POA: Diagnosis not present

## 2023-04-30 DIAGNOSIS — I701 Atherosclerosis of renal artery: Secondary | ICD-10-CM | POA: Diagnosis not present

## 2023-04-30 DIAGNOSIS — E1136 Type 2 diabetes mellitus with diabetic cataract: Secondary | ICD-10-CM | POA: Diagnosis not present

## 2023-05-05 DIAGNOSIS — G309 Alzheimer's disease, unspecified: Secondary | ICD-10-CM | POA: Diagnosis not present

## 2023-05-05 DIAGNOSIS — L89154 Pressure ulcer of sacral region, stage 4: Secondary | ICD-10-CM | POA: Diagnosis not present

## 2023-05-05 DIAGNOSIS — E1136 Type 2 diabetes mellitus with diabetic cataract: Secondary | ICD-10-CM | POA: Diagnosis not present

## 2023-05-05 DIAGNOSIS — F0280 Dementia in other diseases classified elsewhere without behavioral disturbance: Secondary | ICD-10-CM | POA: Diagnosis not present

## 2023-05-05 DIAGNOSIS — L89220 Pressure ulcer of left hip, unstageable: Secondary | ICD-10-CM | POA: Diagnosis not present

## 2023-05-05 DIAGNOSIS — I701 Atherosclerosis of renal artery: Secondary | ICD-10-CM | POA: Diagnosis not present

## 2023-05-07 DIAGNOSIS — F0280 Dementia in other diseases classified elsewhere without behavioral disturbance: Secondary | ICD-10-CM | POA: Diagnosis not present

## 2023-05-07 DIAGNOSIS — L89154 Pressure ulcer of sacral region, stage 4: Secondary | ICD-10-CM | POA: Diagnosis not present

## 2023-05-07 DIAGNOSIS — I701 Atherosclerosis of renal artery: Secondary | ICD-10-CM | POA: Diagnosis not present

## 2023-05-07 DIAGNOSIS — E1136 Type 2 diabetes mellitus with diabetic cataract: Secondary | ICD-10-CM | POA: Diagnosis not present

## 2023-05-07 DIAGNOSIS — G309 Alzheimer's disease, unspecified: Secondary | ICD-10-CM | POA: Diagnosis not present

## 2023-05-07 DIAGNOSIS — L89220 Pressure ulcer of left hip, unstageable: Secondary | ICD-10-CM | POA: Diagnosis not present

## 2023-05-10 DIAGNOSIS — F0280 Dementia in other diseases classified elsewhere without behavioral disturbance: Secondary | ICD-10-CM | POA: Diagnosis not present

## 2023-05-10 DIAGNOSIS — M1990 Unspecified osteoarthritis, unspecified site: Secondary | ICD-10-CM | POA: Diagnosis not present

## 2023-05-10 DIAGNOSIS — I701 Atherosclerosis of renal artery: Secondary | ICD-10-CM | POA: Diagnosis not present

## 2023-05-10 DIAGNOSIS — Z87891 Personal history of nicotine dependence: Secondary | ICD-10-CM | POA: Diagnosis not present

## 2023-05-10 DIAGNOSIS — L89220 Pressure ulcer of left hip, unstageable: Secondary | ICD-10-CM | POA: Diagnosis not present

## 2023-05-10 DIAGNOSIS — E1136 Type 2 diabetes mellitus with diabetic cataract: Secondary | ICD-10-CM | POA: Diagnosis not present

## 2023-05-10 DIAGNOSIS — Z993 Dependence on wheelchair: Secondary | ICD-10-CM | POA: Diagnosis not present

## 2023-05-10 DIAGNOSIS — Z7982 Long term (current) use of aspirin: Secondary | ICD-10-CM | POA: Diagnosis not present

## 2023-05-10 DIAGNOSIS — E785 Hyperlipidemia, unspecified: Secondary | ICD-10-CM | POA: Diagnosis not present

## 2023-05-10 DIAGNOSIS — L89154 Pressure ulcer of sacral region, stage 4: Secondary | ICD-10-CM | POA: Diagnosis not present

## 2023-05-10 DIAGNOSIS — E039 Hypothyroidism, unspecified: Secondary | ICD-10-CM | POA: Diagnosis not present

## 2023-05-10 DIAGNOSIS — I2510 Atherosclerotic heart disease of native coronary artery without angina pectoris: Secondary | ICD-10-CM | POA: Diagnosis not present

## 2023-05-10 DIAGNOSIS — Z85038 Personal history of other malignant neoplasm of large intestine: Secondary | ICD-10-CM | POA: Diagnosis not present

## 2023-05-10 DIAGNOSIS — I10 Essential (primary) hypertension: Secondary | ICD-10-CM | POA: Diagnosis not present

## 2023-05-10 DIAGNOSIS — Z87440 Personal history of urinary (tract) infections: Secondary | ICD-10-CM | POA: Diagnosis not present

## 2023-05-10 DIAGNOSIS — M84451D Pathological fracture, right femur, subsequent encounter for fracture with routine healing: Secondary | ICD-10-CM | POA: Diagnosis not present

## 2023-05-10 DIAGNOSIS — G309 Alzheimer's disease, unspecified: Secondary | ICD-10-CM | POA: Diagnosis not present

## 2023-05-12 DIAGNOSIS — L89154 Pressure ulcer of sacral region, stage 4: Secondary | ICD-10-CM | POA: Diagnosis not present

## 2023-05-12 DIAGNOSIS — G309 Alzheimer's disease, unspecified: Secondary | ICD-10-CM | POA: Diagnosis not present

## 2023-05-12 DIAGNOSIS — E1136 Type 2 diabetes mellitus with diabetic cataract: Secondary | ICD-10-CM | POA: Diagnosis not present

## 2023-05-12 DIAGNOSIS — I701 Atherosclerosis of renal artery: Secondary | ICD-10-CM | POA: Diagnosis not present

## 2023-05-12 DIAGNOSIS — F0280 Dementia in other diseases classified elsewhere without behavioral disturbance: Secondary | ICD-10-CM | POA: Diagnosis not present

## 2023-05-12 DIAGNOSIS — L89220 Pressure ulcer of left hip, unstageable: Secondary | ICD-10-CM | POA: Diagnosis not present

## 2023-05-14 DIAGNOSIS — L89220 Pressure ulcer of left hip, unstageable: Secondary | ICD-10-CM | POA: Diagnosis not present

## 2023-05-14 DIAGNOSIS — I701 Atherosclerosis of renal artery: Secondary | ICD-10-CM | POA: Diagnosis not present

## 2023-05-14 DIAGNOSIS — F0280 Dementia in other diseases classified elsewhere without behavioral disturbance: Secondary | ICD-10-CM | POA: Diagnosis not present

## 2023-05-14 DIAGNOSIS — L89154 Pressure ulcer of sacral region, stage 4: Secondary | ICD-10-CM | POA: Diagnosis not present

## 2023-05-14 DIAGNOSIS — R443 Hallucinations, unspecified: Secondary | ICD-10-CM | POA: Diagnosis not present

## 2023-05-14 DIAGNOSIS — F03B18 Unspecified dementia, moderate, with other behavioral disturbance: Secondary | ICD-10-CM | POA: Diagnosis not present

## 2023-05-14 DIAGNOSIS — E1136 Type 2 diabetes mellitus with diabetic cataract: Secondary | ICD-10-CM | POA: Diagnosis not present

## 2023-05-14 DIAGNOSIS — R262 Difficulty in walking, not elsewhere classified: Secondary | ICD-10-CM | POA: Diagnosis not present

## 2023-05-14 DIAGNOSIS — G309 Alzheimer's disease, unspecified: Secondary | ICD-10-CM | POA: Diagnosis not present

## 2023-05-18 DIAGNOSIS — F0280 Dementia in other diseases classified elsewhere without behavioral disturbance: Secondary | ICD-10-CM | POA: Diagnosis not present

## 2023-05-18 DIAGNOSIS — L89220 Pressure ulcer of left hip, unstageable: Secondary | ICD-10-CM | POA: Diagnosis not present

## 2023-05-18 DIAGNOSIS — G309 Alzheimer's disease, unspecified: Secondary | ICD-10-CM | POA: Diagnosis not present

## 2023-05-18 DIAGNOSIS — I701 Atherosclerosis of renal artery: Secondary | ICD-10-CM | POA: Diagnosis not present

## 2023-05-18 DIAGNOSIS — E1136 Type 2 diabetes mellitus with diabetic cataract: Secondary | ICD-10-CM | POA: Diagnosis not present

## 2023-05-18 DIAGNOSIS — L89154 Pressure ulcer of sacral region, stage 4: Secondary | ICD-10-CM | POA: Diagnosis not present

## 2023-05-19 ENCOUNTER — Telehealth: Payer: Self-pay

## 2023-05-19 NOTE — Telephone Encounter (Signed)
Gave verbal order to [facility]  for nursing to nancy [phone] for wound care and and as per alyssa ok to xeroform  and also gave frequency for nursing twice a week for 4 week

## 2023-05-26 DIAGNOSIS — E1136 Type 2 diabetes mellitus with diabetic cataract: Secondary | ICD-10-CM | POA: Diagnosis not present

## 2023-05-26 DIAGNOSIS — L89220 Pressure ulcer of left hip, unstageable: Secondary | ICD-10-CM | POA: Diagnosis not present

## 2023-05-26 DIAGNOSIS — I701 Atherosclerosis of renal artery: Secondary | ICD-10-CM | POA: Diagnosis not present

## 2023-05-26 DIAGNOSIS — G309 Alzheimer's disease, unspecified: Secondary | ICD-10-CM | POA: Diagnosis not present

## 2023-05-26 DIAGNOSIS — F0280 Dementia in other diseases classified elsewhere without behavioral disturbance: Secondary | ICD-10-CM | POA: Diagnosis not present

## 2023-05-26 DIAGNOSIS — L89154 Pressure ulcer of sacral region, stage 4: Secondary | ICD-10-CM | POA: Diagnosis not present

## 2023-05-29 DIAGNOSIS — E1136 Type 2 diabetes mellitus with diabetic cataract: Secondary | ICD-10-CM | POA: Diagnosis not present

## 2023-05-29 DIAGNOSIS — L89154 Pressure ulcer of sacral region, stage 4: Secondary | ICD-10-CM | POA: Diagnosis not present

## 2023-05-29 DIAGNOSIS — L89220 Pressure ulcer of left hip, unstageable: Secondary | ICD-10-CM | POA: Diagnosis not present

## 2023-05-29 DIAGNOSIS — G309 Alzheimer's disease, unspecified: Secondary | ICD-10-CM | POA: Diagnosis not present

## 2023-05-29 DIAGNOSIS — F0280 Dementia in other diseases classified elsewhere without behavioral disturbance: Secondary | ICD-10-CM | POA: Diagnosis not present

## 2023-05-29 DIAGNOSIS — I701 Atherosclerosis of renal artery: Secondary | ICD-10-CM | POA: Diagnosis not present

## 2023-06-02 DIAGNOSIS — L89154 Pressure ulcer of sacral region, stage 4: Secondary | ICD-10-CM | POA: Diagnosis not present

## 2023-06-02 DIAGNOSIS — F0280 Dementia in other diseases classified elsewhere without behavioral disturbance: Secondary | ICD-10-CM | POA: Diagnosis not present

## 2023-06-02 DIAGNOSIS — L89220 Pressure ulcer of left hip, unstageable: Secondary | ICD-10-CM | POA: Diagnosis not present

## 2023-06-02 DIAGNOSIS — G309 Alzheimer's disease, unspecified: Secondary | ICD-10-CM | POA: Diagnosis not present

## 2023-06-02 DIAGNOSIS — E1136 Type 2 diabetes mellitus with diabetic cataract: Secondary | ICD-10-CM | POA: Diagnosis not present

## 2023-06-02 DIAGNOSIS — I701 Atherosclerosis of renal artery: Secondary | ICD-10-CM | POA: Diagnosis not present

## 2023-06-04 ENCOUNTER — Telehealth: Payer: Self-pay

## 2023-06-04 NOTE — Telephone Encounter (Signed)
 Tiffany from [facility] called to get verbal orders to discharge patient on Monday from wound care service as her wounds are healed. Verbal orders were given. [phone]

## 2023-06-08 DIAGNOSIS — E1136 Type 2 diabetes mellitus with diabetic cataract: Secondary | ICD-10-CM | POA: Diagnosis not present

## 2023-06-08 DIAGNOSIS — L89154 Pressure ulcer of sacral region, stage 4: Secondary | ICD-10-CM | POA: Diagnosis not present

## 2023-06-08 DIAGNOSIS — F0280 Dementia in other diseases classified elsewhere without behavioral disturbance: Secondary | ICD-10-CM | POA: Diagnosis not present

## 2023-06-08 DIAGNOSIS — L89220 Pressure ulcer of left hip, unstageable: Secondary | ICD-10-CM | POA: Diagnosis not present

## 2023-06-08 DIAGNOSIS — G309 Alzheimer's disease, unspecified: Secondary | ICD-10-CM | POA: Diagnosis not present

## 2023-06-08 DIAGNOSIS — I701 Atherosclerosis of renal artery: Secondary | ICD-10-CM | POA: Diagnosis not present

## 2023-07-20 ENCOUNTER — Telehealth: Payer: Self-pay

## 2023-07-20 ENCOUNTER — Other Ambulatory Visit: Payer: Self-pay

## 2023-07-20 MED ORDER — FOSFOMYCIN TROMETHAMINE 3 G PO PACK: 3.0000 g | PACK | Freq: Once | ORAL | 0 refills | Status: AC

## 2023-07-20 NOTE — Telephone Encounter (Signed)
 Pt daughter called that pt is having UTI symptoms she is unable bring her in office as per alyssa sent fosfomycin 3 gram advised if not feeling better need appt

## 2023-07-24 ENCOUNTER — Other Ambulatory Visit: Payer: Self-pay | Admitting: Internal Medicine

## 2023-07-24 DIAGNOSIS — I10 Essential (primary) hypertension: Secondary | ICD-10-CM

## 2023-07-30 NOTE — Progress Notes (Signed)
error 

## 2023-07-31 ENCOUNTER — Other Ambulatory Visit: Payer: Self-pay

## 2023-07-31 ENCOUNTER — Telehealth: Payer: Self-pay

## 2023-07-31 MED ORDER — FOSFOMYCIN TROMETHAMINE 3 G PO PACK: 3.0000 g | PACK | Freq: Once | ORAL | 1 refills | Status: AC

## 2023-07-31 NOTE — Telephone Encounter (Signed)
 As per dfk sent fosfomycin 1 with 1 refills for UTI

## 2023-08-11 ENCOUNTER — Other Ambulatory Visit: Payer: Self-pay | Admitting: Internal Medicine

## 2023-08-11 ENCOUNTER — Encounter: Payer: Self-pay | Admitting: Internal Medicine

## 2023-08-11 ENCOUNTER — Telehealth: Payer: Self-pay

## 2023-08-11 DIAGNOSIS — Z7401 Bed confinement status: Secondary | ICD-10-CM

## 2023-08-11 DIAGNOSIS — R262 Difficulty in walking, not elsewhere classified: Secondary | ICD-10-CM

## 2023-08-11 DIAGNOSIS — F0150 Vascular dementia without behavioral disturbance: Secondary | ICD-10-CM

## 2023-08-11 NOTE — Telephone Encounter (Signed)
 Spoke with [facility] that we placed referral

## 2023-08-11 NOTE — Telephone Encounter (Signed)
 Spoke with pt daughter advised that we placed referral for hospice [facility] will call you

## 2023-08-12 DIAGNOSIS — Z66 Do not resuscitate: Secondary | ICD-10-CM | POA: Diagnosis not present

## 2023-08-12 DIAGNOSIS — I10 Essential (primary) hypertension: Secondary | ICD-10-CM | POA: Diagnosis not present

## 2023-08-12 DIAGNOSIS — Z87440 Personal history of urinary (tract) infections: Secondary | ICD-10-CM | POA: Diagnosis not present

## 2023-08-12 DIAGNOSIS — R2689 Other abnormalities of gait and mobility: Secondary | ICD-10-CM | POA: Diagnosis not present

## 2023-08-12 DIAGNOSIS — F0280 Dementia in other diseases classified elsewhere without behavioral disturbance: Secondary | ICD-10-CM | POA: Diagnosis not present

## 2023-08-12 DIAGNOSIS — Z515 Encounter for palliative care: Secondary | ICD-10-CM | POA: Diagnosis not present

## 2023-08-12 DIAGNOSIS — Z741 Need for assistance with personal care: Secondary | ICD-10-CM | POA: Diagnosis not present

## 2023-08-12 DIAGNOSIS — E039 Hypothyroidism, unspecified: Secondary | ICD-10-CM | POA: Diagnosis not present

## 2023-08-12 DIAGNOSIS — E785 Hyperlipidemia, unspecified: Secondary | ICD-10-CM | POA: Diagnosis not present

## 2023-08-12 DIAGNOSIS — G311 Senile degeneration of brain, not elsewhere classified: Secondary | ICD-10-CM | POA: Diagnosis not present

## 2023-08-17 DIAGNOSIS — Z741 Need for assistance with personal care: Secondary | ICD-10-CM | POA: Diagnosis not present

## 2023-08-17 DIAGNOSIS — F0280 Dementia in other diseases classified elsewhere without behavioral disturbance: Secondary | ICD-10-CM | POA: Diagnosis not present

## 2023-08-17 DIAGNOSIS — Z87440 Personal history of urinary (tract) infections: Secondary | ICD-10-CM | POA: Diagnosis not present

## 2023-08-17 DIAGNOSIS — E785 Hyperlipidemia, unspecified: Secondary | ICD-10-CM | POA: Diagnosis not present

## 2023-08-17 DIAGNOSIS — R2689 Other abnormalities of gait and mobility: Secondary | ICD-10-CM | POA: Diagnosis not present

## 2023-08-17 DIAGNOSIS — G311 Senile degeneration of brain, not elsewhere classified: Secondary | ICD-10-CM | POA: Diagnosis not present

## 2023-08-18 DIAGNOSIS — Z741 Need for assistance with personal care: Secondary | ICD-10-CM | POA: Diagnosis not present

## 2023-08-18 DIAGNOSIS — G311 Senile degeneration of brain, not elsewhere classified: Secondary | ICD-10-CM | POA: Diagnosis not present

## 2023-08-18 DIAGNOSIS — F0280 Dementia in other diseases classified elsewhere without behavioral disturbance: Secondary | ICD-10-CM | POA: Diagnosis not present

## 2023-08-18 DIAGNOSIS — Z87440 Personal history of urinary (tract) infections: Secondary | ICD-10-CM | POA: Diagnosis not present

## 2023-08-18 DIAGNOSIS — R2689 Other abnormalities of gait and mobility: Secondary | ICD-10-CM | POA: Diagnosis not present

## 2023-08-18 DIAGNOSIS — E785 Hyperlipidemia, unspecified: Secondary | ICD-10-CM | POA: Diagnosis not present

## 2023-08-24 DIAGNOSIS — E785 Hyperlipidemia, unspecified: Secondary | ICD-10-CM | POA: Diagnosis not present

## 2023-08-24 DIAGNOSIS — Z741 Need for assistance with personal care: Secondary | ICD-10-CM | POA: Diagnosis not present

## 2023-08-24 DIAGNOSIS — F0280 Dementia in other diseases classified elsewhere without behavioral disturbance: Secondary | ICD-10-CM | POA: Diagnosis not present

## 2023-08-24 DIAGNOSIS — G311 Senile degeneration of brain, not elsewhere classified: Secondary | ICD-10-CM | POA: Diagnosis not present

## 2023-08-24 DIAGNOSIS — R2689 Other abnormalities of gait and mobility: Secondary | ICD-10-CM | POA: Diagnosis not present

## 2023-08-24 DIAGNOSIS — Z87440 Personal history of urinary (tract) infections: Secondary | ICD-10-CM | POA: Diagnosis not present

## 2023-08-27 DIAGNOSIS — F0280 Dementia in other diseases classified elsewhere without behavioral disturbance: Secondary | ICD-10-CM | POA: Diagnosis not present

## 2023-08-27 DIAGNOSIS — Z66 Do not resuscitate: Secondary | ICD-10-CM | POA: Diagnosis not present

## 2023-08-27 DIAGNOSIS — E039 Hypothyroidism, unspecified: Secondary | ICD-10-CM | POA: Diagnosis not present

## 2023-08-27 DIAGNOSIS — Z741 Need for assistance with personal care: Secondary | ICD-10-CM | POA: Diagnosis not present

## 2023-08-27 DIAGNOSIS — R2689 Other abnormalities of gait and mobility: Secondary | ICD-10-CM | POA: Diagnosis not present

## 2023-08-27 DIAGNOSIS — I10 Essential (primary) hypertension: Secondary | ICD-10-CM | POA: Diagnosis not present

## 2023-08-27 DIAGNOSIS — G311 Senile degeneration of brain, not elsewhere classified: Secondary | ICD-10-CM | POA: Diagnosis not present

## 2023-08-27 DIAGNOSIS — Z515 Encounter for palliative care: Secondary | ICD-10-CM | POA: Diagnosis not present

## 2023-08-27 DIAGNOSIS — E785 Hyperlipidemia, unspecified: Secondary | ICD-10-CM | POA: Diagnosis not present

## 2023-08-27 DIAGNOSIS — Z87440 Personal history of urinary (tract) infections: Secondary | ICD-10-CM | POA: Diagnosis not present

## 2023-08-31 DIAGNOSIS — R2689 Other abnormalities of gait and mobility: Secondary | ICD-10-CM | POA: Diagnosis not present

## 2023-08-31 DIAGNOSIS — F0280 Dementia in other diseases classified elsewhere without behavioral disturbance: Secondary | ICD-10-CM | POA: Diagnosis not present

## 2023-08-31 DIAGNOSIS — G311 Senile degeneration of brain, not elsewhere classified: Secondary | ICD-10-CM | POA: Diagnosis not present

## 2023-08-31 DIAGNOSIS — Z87440 Personal history of urinary (tract) infections: Secondary | ICD-10-CM | POA: Diagnosis not present

## 2023-08-31 DIAGNOSIS — Z741 Need for assistance with personal care: Secondary | ICD-10-CM | POA: Diagnosis not present

## 2023-08-31 DIAGNOSIS — E785 Hyperlipidemia, unspecified: Secondary | ICD-10-CM | POA: Diagnosis not present

## 2023-09-03 DIAGNOSIS — G311 Senile degeneration of brain, not elsewhere classified: Secondary | ICD-10-CM | POA: Diagnosis not present

## 2023-09-03 DIAGNOSIS — Z741 Need for assistance with personal care: Secondary | ICD-10-CM | POA: Diagnosis not present

## 2023-09-03 DIAGNOSIS — E785 Hyperlipidemia, unspecified: Secondary | ICD-10-CM | POA: Diagnosis not present

## 2023-09-03 DIAGNOSIS — F0280 Dementia in other diseases classified elsewhere without behavioral disturbance: Secondary | ICD-10-CM | POA: Diagnosis not present

## 2023-09-03 DIAGNOSIS — R2689 Other abnormalities of gait and mobility: Secondary | ICD-10-CM | POA: Diagnosis not present

## 2023-09-03 DIAGNOSIS — Z87440 Personal history of urinary (tract) infections: Secondary | ICD-10-CM | POA: Diagnosis not present

## 2023-09-07 DIAGNOSIS — R2689 Other abnormalities of gait and mobility: Secondary | ICD-10-CM | POA: Diagnosis not present

## 2023-09-07 DIAGNOSIS — Z741 Need for assistance with personal care: Secondary | ICD-10-CM | POA: Diagnosis not present

## 2023-09-07 DIAGNOSIS — Z87440 Personal history of urinary (tract) infections: Secondary | ICD-10-CM | POA: Diagnosis not present

## 2023-09-07 DIAGNOSIS — G311 Senile degeneration of brain, not elsewhere classified: Secondary | ICD-10-CM | POA: Diagnosis not present

## 2023-09-07 DIAGNOSIS — E785 Hyperlipidemia, unspecified: Secondary | ICD-10-CM | POA: Diagnosis not present

## 2023-09-07 DIAGNOSIS — F0280 Dementia in other diseases classified elsewhere without behavioral disturbance: Secondary | ICD-10-CM | POA: Diagnosis not present

## 2023-09-14 DIAGNOSIS — Z741 Need for assistance with personal care: Secondary | ICD-10-CM | POA: Diagnosis not present

## 2023-09-14 DIAGNOSIS — Z87440 Personal history of urinary (tract) infections: Secondary | ICD-10-CM | POA: Diagnosis not present

## 2023-09-14 DIAGNOSIS — R2689 Other abnormalities of gait and mobility: Secondary | ICD-10-CM | POA: Diagnosis not present

## 2023-09-14 DIAGNOSIS — F0280 Dementia in other diseases classified elsewhere without behavioral disturbance: Secondary | ICD-10-CM | POA: Diagnosis not present

## 2023-09-14 DIAGNOSIS — G311 Senile degeneration of brain, not elsewhere classified: Secondary | ICD-10-CM | POA: Diagnosis not present

## 2023-09-14 DIAGNOSIS — E785 Hyperlipidemia, unspecified: Secondary | ICD-10-CM | POA: Diagnosis not present

## 2023-09-22 DIAGNOSIS — F0280 Dementia in other diseases classified elsewhere without behavioral disturbance: Secondary | ICD-10-CM | POA: Diagnosis not present

## 2023-09-22 DIAGNOSIS — R2689 Other abnormalities of gait and mobility: Secondary | ICD-10-CM | POA: Diagnosis not present

## 2023-09-22 DIAGNOSIS — G311 Senile degeneration of brain, not elsewhere classified: Secondary | ICD-10-CM | POA: Diagnosis not present

## 2023-09-22 DIAGNOSIS — Z87440 Personal history of urinary (tract) infections: Secondary | ICD-10-CM | POA: Diagnosis not present

## 2023-09-22 DIAGNOSIS — E785 Hyperlipidemia, unspecified: Secondary | ICD-10-CM | POA: Diagnosis not present

## 2023-09-22 DIAGNOSIS — Z741 Need for assistance with personal care: Secondary | ICD-10-CM | POA: Diagnosis not present

## 2023-09-27 DIAGNOSIS — E039 Hypothyroidism, unspecified: Secondary | ICD-10-CM | POA: Diagnosis not present

## 2023-09-27 DIAGNOSIS — Z741 Need for assistance with personal care: Secondary | ICD-10-CM | POA: Diagnosis not present

## 2023-09-27 DIAGNOSIS — Z66 Do not resuscitate: Secondary | ICD-10-CM | POA: Diagnosis not present

## 2023-09-27 DIAGNOSIS — Z87440 Personal history of urinary (tract) infections: Secondary | ICD-10-CM | POA: Diagnosis not present

## 2023-09-27 DIAGNOSIS — Z515 Encounter for palliative care: Secondary | ICD-10-CM | POA: Diagnosis not present

## 2023-09-27 DIAGNOSIS — E785 Hyperlipidemia, unspecified: Secondary | ICD-10-CM | POA: Diagnosis not present

## 2023-09-27 DIAGNOSIS — I10 Essential (primary) hypertension: Secondary | ICD-10-CM | POA: Diagnosis not present

## 2023-09-27 DIAGNOSIS — R2689 Other abnormalities of gait and mobility: Secondary | ICD-10-CM | POA: Diagnosis not present

## 2023-09-27 DIAGNOSIS — F0280 Dementia in other diseases classified elsewhere without behavioral disturbance: Secondary | ICD-10-CM | POA: Diagnosis not present

## 2023-09-27 DIAGNOSIS — G311 Senile degeneration of brain, not elsewhere classified: Secondary | ICD-10-CM | POA: Diagnosis not present

## 2023-09-28 DIAGNOSIS — Z87440 Personal history of urinary (tract) infections: Secondary | ICD-10-CM | POA: Diagnosis not present

## 2023-09-28 DIAGNOSIS — Z741 Need for assistance with personal care: Secondary | ICD-10-CM | POA: Diagnosis not present

## 2023-09-28 DIAGNOSIS — F0280 Dementia in other diseases classified elsewhere without behavioral disturbance: Secondary | ICD-10-CM | POA: Diagnosis not present

## 2023-09-28 DIAGNOSIS — G311 Senile degeneration of brain, not elsewhere classified: Secondary | ICD-10-CM | POA: Diagnosis not present

## 2023-09-28 DIAGNOSIS — R2689 Other abnormalities of gait and mobility: Secondary | ICD-10-CM | POA: Diagnosis not present

## 2023-09-28 DIAGNOSIS — E785 Hyperlipidemia, unspecified: Secondary | ICD-10-CM | POA: Diagnosis not present

## 2023-09-29 DIAGNOSIS — G311 Senile degeneration of brain, not elsewhere classified: Secondary | ICD-10-CM | POA: Diagnosis not present

## 2023-09-29 DIAGNOSIS — Z741 Need for assistance with personal care: Secondary | ICD-10-CM | POA: Diagnosis not present

## 2023-09-29 DIAGNOSIS — Z87440 Personal history of urinary (tract) infections: Secondary | ICD-10-CM | POA: Diagnosis not present

## 2023-09-29 DIAGNOSIS — R2689 Other abnormalities of gait and mobility: Secondary | ICD-10-CM | POA: Diagnosis not present

## 2023-09-29 DIAGNOSIS — F0280 Dementia in other diseases classified elsewhere without behavioral disturbance: Secondary | ICD-10-CM | POA: Diagnosis not present

## 2023-09-29 DIAGNOSIS — E785 Hyperlipidemia, unspecified: Secondary | ICD-10-CM | POA: Diagnosis not present

## 2023-10-05 DIAGNOSIS — Z741 Need for assistance with personal care: Secondary | ICD-10-CM | POA: Diagnosis not present

## 2023-10-05 DIAGNOSIS — Z87440 Personal history of urinary (tract) infections: Secondary | ICD-10-CM | POA: Diagnosis not present

## 2023-10-05 DIAGNOSIS — R2689 Other abnormalities of gait and mobility: Secondary | ICD-10-CM | POA: Diagnosis not present

## 2023-10-05 DIAGNOSIS — E785 Hyperlipidemia, unspecified: Secondary | ICD-10-CM | POA: Diagnosis not present

## 2023-10-05 DIAGNOSIS — F0280 Dementia in other diseases classified elsewhere without behavioral disturbance: Secondary | ICD-10-CM | POA: Diagnosis not present

## 2023-10-05 DIAGNOSIS — G311 Senile degeneration of brain, not elsewhere classified: Secondary | ICD-10-CM | POA: Diagnosis not present

## 2023-10-11 DIAGNOSIS — F0280 Dementia in other diseases classified elsewhere without behavioral disturbance: Secondary | ICD-10-CM | POA: Diagnosis not present

## 2023-10-11 DIAGNOSIS — Z87440 Personal history of urinary (tract) infections: Secondary | ICD-10-CM | POA: Diagnosis not present

## 2023-10-11 DIAGNOSIS — Z741 Need for assistance with personal care: Secondary | ICD-10-CM | POA: Diagnosis not present

## 2023-10-11 DIAGNOSIS — R2689 Other abnormalities of gait and mobility: Secondary | ICD-10-CM | POA: Diagnosis not present

## 2023-10-11 DIAGNOSIS — E785 Hyperlipidemia, unspecified: Secondary | ICD-10-CM | POA: Diagnosis not present

## 2023-10-11 DIAGNOSIS — G311 Senile degeneration of brain, not elsewhere classified: Secondary | ICD-10-CM | POA: Diagnosis not present

## 2023-10-12 DIAGNOSIS — R2689 Other abnormalities of gait and mobility: Secondary | ICD-10-CM | POA: Diagnosis not present

## 2023-10-12 DIAGNOSIS — Z741 Need for assistance with personal care: Secondary | ICD-10-CM | POA: Diagnosis not present

## 2023-10-12 DIAGNOSIS — F0280 Dementia in other diseases classified elsewhere without behavioral disturbance: Secondary | ICD-10-CM | POA: Diagnosis not present

## 2023-10-12 DIAGNOSIS — G311 Senile degeneration of brain, not elsewhere classified: Secondary | ICD-10-CM | POA: Diagnosis not present

## 2023-10-12 DIAGNOSIS — E785 Hyperlipidemia, unspecified: Secondary | ICD-10-CM | POA: Diagnosis not present

## 2023-10-12 DIAGNOSIS — Z87440 Personal history of urinary (tract) infections: Secondary | ICD-10-CM | POA: Diagnosis not present

## 2023-10-15 DIAGNOSIS — Z741 Need for assistance with personal care: Secondary | ICD-10-CM | POA: Diagnosis not present

## 2023-10-15 DIAGNOSIS — E785 Hyperlipidemia, unspecified: Secondary | ICD-10-CM | POA: Diagnosis not present

## 2023-10-15 DIAGNOSIS — F0280 Dementia in other diseases classified elsewhere without behavioral disturbance: Secondary | ICD-10-CM | POA: Diagnosis not present

## 2023-10-15 DIAGNOSIS — R2689 Other abnormalities of gait and mobility: Secondary | ICD-10-CM | POA: Diagnosis not present

## 2023-10-15 DIAGNOSIS — G311 Senile degeneration of brain, not elsewhere classified: Secondary | ICD-10-CM | POA: Diagnosis not present

## 2023-10-15 DIAGNOSIS — Z87440 Personal history of urinary (tract) infections: Secondary | ICD-10-CM | POA: Diagnosis not present

## 2023-10-19 DIAGNOSIS — E785 Hyperlipidemia, unspecified: Secondary | ICD-10-CM | POA: Diagnosis not present

## 2023-10-19 DIAGNOSIS — G311 Senile degeneration of brain, not elsewhere classified: Secondary | ICD-10-CM | POA: Diagnosis not present

## 2023-10-19 DIAGNOSIS — Z87440 Personal history of urinary (tract) infections: Secondary | ICD-10-CM | POA: Diagnosis not present

## 2023-10-19 DIAGNOSIS — R2689 Other abnormalities of gait and mobility: Secondary | ICD-10-CM | POA: Diagnosis not present

## 2023-10-19 DIAGNOSIS — F0280 Dementia in other diseases classified elsewhere without behavioral disturbance: Secondary | ICD-10-CM | POA: Diagnosis not present

## 2023-10-19 DIAGNOSIS — Z741 Need for assistance with personal care: Secondary | ICD-10-CM | POA: Diagnosis not present

## 2023-10-26 DIAGNOSIS — R2689 Other abnormalities of gait and mobility: Secondary | ICD-10-CM | POA: Diagnosis not present

## 2023-10-26 DIAGNOSIS — Z87440 Personal history of urinary (tract) infections: Secondary | ICD-10-CM | POA: Diagnosis not present

## 2023-10-26 DIAGNOSIS — Z741 Need for assistance with personal care: Secondary | ICD-10-CM | POA: Diagnosis not present

## 2023-10-26 DIAGNOSIS — G311 Senile degeneration of brain, not elsewhere classified: Secondary | ICD-10-CM | POA: Diagnosis not present

## 2023-10-26 DIAGNOSIS — F0280 Dementia in other diseases classified elsewhere without behavioral disturbance: Secondary | ICD-10-CM | POA: Diagnosis not present

## 2023-10-26 DIAGNOSIS — E785 Hyperlipidemia, unspecified: Secondary | ICD-10-CM | POA: Diagnosis not present

## 2023-10-27 DIAGNOSIS — F0280 Dementia in other diseases classified elsewhere without behavioral disturbance: Secondary | ICD-10-CM | POA: Diagnosis not present

## 2023-10-27 DIAGNOSIS — E039 Hypothyroidism, unspecified: Secondary | ICD-10-CM | POA: Diagnosis not present

## 2023-10-27 DIAGNOSIS — Z66 Do not resuscitate: Secondary | ICD-10-CM | POA: Diagnosis not present

## 2023-10-27 DIAGNOSIS — I10 Essential (primary) hypertension: Secondary | ICD-10-CM | POA: Diagnosis not present

## 2023-10-27 DIAGNOSIS — R2689 Other abnormalities of gait and mobility: Secondary | ICD-10-CM | POA: Diagnosis not present

## 2023-10-27 DIAGNOSIS — E785 Hyperlipidemia, unspecified: Secondary | ICD-10-CM | POA: Diagnosis not present

## 2023-10-27 DIAGNOSIS — Z741 Need for assistance with personal care: Secondary | ICD-10-CM | POA: Diagnosis not present

## 2023-10-27 DIAGNOSIS — Z87440 Personal history of urinary (tract) infections: Secondary | ICD-10-CM | POA: Diagnosis not present

## 2023-10-27 DIAGNOSIS — Z515 Encounter for palliative care: Secondary | ICD-10-CM | POA: Diagnosis not present

## 2023-10-27 DIAGNOSIS — G311 Senile degeneration of brain, not elsewhere classified: Secondary | ICD-10-CM | POA: Diagnosis not present

## 2023-11-02 DIAGNOSIS — F0280 Dementia in other diseases classified elsewhere without behavioral disturbance: Secondary | ICD-10-CM | POA: Diagnosis not present

## 2023-11-02 DIAGNOSIS — R2689 Other abnormalities of gait and mobility: Secondary | ICD-10-CM | POA: Diagnosis not present

## 2023-11-02 DIAGNOSIS — E785 Hyperlipidemia, unspecified: Secondary | ICD-10-CM | POA: Diagnosis not present

## 2023-11-02 DIAGNOSIS — Z741 Need for assistance with personal care: Secondary | ICD-10-CM | POA: Diagnosis not present

## 2023-11-02 DIAGNOSIS — G311 Senile degeneration of brain, not elsewhere classified: Secondary | ICD-10-CM | POA: Diagnosis not present

## 2023-11-02 DIAGNOSIS — Z87440 Personal history of urinary (tract) infections: Secondary | ICD-10-CM | POA: Diagnosis not present

## 2023-11-09 DIAGNOSIS — F0280 Dementia in other diseases classified elsewhere without behavioral disturbance: Secondary | ICD-10-CM | POA: Diagnosis not present

## 2023-11-09 DIAGNOSIS — E785 Hyperlipidemia, unspecified: Secondary | ICD-10-CM | POA: Diagnosis not present

## 2023-11-09 DIAGNOSIS — Z87440 Personal history of urinary (tract) infections: Secondary | ICD-10-CM | POA: Diagnosis not present

## 2023-11-09 DIAGNOSIS — R2689 Other abnormalities of gait and mobility: Secondary | ICD-10-CM | POA: Diagnosis not present

## 2023-11-09 DIAGNOSIS — G311 Senile degeneration of brain, not elsewhere classified: Secondary | ICD-10-CM | POA: Diagnosis not present

## 2023-11-09 DIAGNOSIS — Z741 Need for assistance with personal care: Secondary | ICD-10-CM | POA: Diagnosis not present

## 2023-11-12 DIAGNOSIS — Z741 Need for assistance with personal care: Secondary | ICD-10-CM | POA: Diagnosis not present

## 2023-11-12 DIAGNOSIS — G311 Senile degeneration of brain, not elsewhere classified: Secondary | ICD-10-CM | POA: Diagnosis not present

## 2023-11-12 DIAGNOSIS — F0280 Dementia in other diseases classified elsewhere without behavioral disturbance: Secondary | ICD-10-CM | POA: Diagnosis not present

## 2023-11-12 DIAGNOSIS — E785 Hyperlipidemia, unspecified: Secondary | ICD-10-CM | POA: Diagnosis not present

## 2023-11-12 DIAGNOSIS — Z87440 Personal history of urinary (tract) infections: Secondary | ICD-10-CM | POA: Diagnosis not present

## 2023-11-12 DIAGNOSIS — R2689 Other abnormalities of gait and mobility: Secondary | ICD-10-CM | POA: Diagnosis not present

## 2023-11-16 DIAGNOSIS — Z741 Need for assistance with personal care: Secondary | ICD-10-CM | POA: Diagnosis not present

## 2023-11-16 DIAGNOSIS — G311 Senile degeneration of brain, not elsewhere classified: Secondary | ICD-10-CM | POA: Diagnosis not present

## 2023-11-16 DIAGNOSIS — E785 Hyperlipidemia, unspecified: Secondary | ICD-10-CM | POA: Diagnosis not present

## 2023-11-16 DIAGNOSIS — Z87440 Personal history of urinary (tract) infections: Secondary | ICD-10-CM | POA: Diagnosis not present

## 2023-11-16 DIAGNOSIS — F0280 Dementia in other diseases classified elsewhere without behavioral disturbance: Secondary | ICD-10-CM | POA: Diagnosis not present

## 2023-11-16 DIAGNOSIS — R2689 Other abnormalities of gait and mobility: Secondary | ICD-10-CM | POA: Diagnosis not present

## 2023-11-23 DIAGNOSIS — Z741 Need for assistance with personal care: Secondary | ICD-10-CM | POA: Diagnosis not present

## 2023-11-23 DIAGNOSIS — R2689 Other abnormalities of gait and mobility: Secondary | ICD-10-CM | POA: Diagnosis not present

## 2023-11-23 DIAGNOSIS — E785 Hyperlipidemia, unspecified: Secondary | ICD-10-CM | POA: Diagnosis not present

## 2023-11-23 DIAGNOSIS — G311 Senile degeneration of brain, not elsewhere classified: Secondary | ICD-10-CM | POA: Diagnosis not present

## 2023-11-23 DIAGNOSIS — Z87440 Personal history of urinary (tract) infections: Secondary | ICD-10-CM | POA: Diagnosis not present

## 2023-11-23 DIAGNOSIS — F0280 Dementia in other diseases classified elsewhere without behavioral disturbance: Secondary | ICD-10-CM | POA: Diagnosis not present

## 2023-11-26 ENCOUNTER — Other Ambulatory Visit: Payer: Self-pay | Admitting: Nurse Practitioner

## 2023-11-26 DIAGNOSIS — E039 Hypothyroidism, unspecified: Secondary | ICD-10-CM

## 2023-11-26 DIAGNOSIS — M1A9XX0 Chronic gout, unspecified, without tophus (tophi): Secondary | ICD-10-CM

## 2023-11-26 DIAGNOSIS — E782 Mixed hyperlipidemia: Secondary | ICD-10-CM

## 2023-11-27 DIAGNOSIS — E039 Hypothyroidism, unspecified: Secondary | ICD-10-CM | POA: Diagnosis not present

## 2023-11-27 DIAGNOSIS — Z87440 Personal history of urinary (tract) infections: Secondary | ICD-10-CM | POA: Diagnosis not present

## 2023-11-27 DIAGNOSIS — R2689 Other abnormalities of gait and mobility: Secondary | ICD-10-CM | POA: Diagnosis not present

## 2023-11-27 DIAGNOSIS — Z741 Need for assistance with personal care: Secondary | ICD-10-CM | POA: Diagnosis not present

## 2023-11-27 DIAGNOSIS — Z515 Encounter for palliative care: Secondary | ICD-10-CM | POA: Diagnosis not present

## 2023-11-27 DIAGNOSIS — Z66 Do not resuscitate: Secondary | ICD-10-CM | POA: Diagnosis not present

## 2023-11-27 DIAGNOSIS — E785 Hyperlipidemia, unspecified: Secondary | ICD-10-CM | POA: Diagnosis not present

## 2023-11-27 DIAGNOSIS — F0280 Dementia in other diseases classified elsewhere without behavioral disturbance: Secondary | ICD-10-CM | POA: Diagnosis not present

## 2023-11-27 DIAGNOSIS — I10 Essential (primary) hypertension: Secondary | ICD-10-CM | POA: Diagnosis not present

## 2023-11-27 DIAGNOSIS — G311 Senile degeneration of brain, not elsewhere classified: Secondary | ICD-10-CM | POA: Diagnosis not present

## 2023-11-30 DIAGNOSIS — Z741 Need for assistance with personal care: Secondary | ICD-10-CM | POA: Diagnosis not present

## 2023-11-30 DIAGNOSIS — Z87440 Personal history of urinary (tract) infections: Secondary | ICD-10-CM | POA: Diagnosis not present

## 2023-11-30 DIAGNOSIS — F0280 Dementia in other diseases classified elsewhere without behavioral disturbance: Secondary | ICD-10-CM | POA: Diagnosis not present

## 2023-11-30 DIAGNOSIS — G311 Senile degeneration of brain, not elsewhere classified: Secondary | ICD-10-CM | POA: Diagnosis not present

## 2023-11-30 DIAGNOSIS — E785 Hyperlipidemia, unspecified: Secondary | ICD-10-CM | POA: Diagnosis not present

## 2023-11-30 DIAGNOSIS — R2689 Other abnormalities of gait and mobility: Secondary | ICD-10-CM | POA: Diagnosis not present

## 2023-12-07 DIAGNOSIS — R2689 Other abnormalities of gait and mobility: Secondary | ICD-10-CM | POA: Diagnosis not present

## 2023-12-07 DIAGNOSIS — Z741 Need for assistance with personal care: Secondary | ICD-10-CM | POA: Diagnosis not present

## 2023-12-07 DIAGNOSIS — G311 Senile degeneration of brain, not elsewhere classified: Secondary | ICD-10-CM | POA: Diagnosis not present

## 2023-12-07 DIAGNOSIS — E785 Hyperlipidemia, unspecified: Secondary | ICD-10-CM | POA: Diagnosis not present

## 2023-12-07 DIAGNOSIS — Z87440 Personal history of urinary (tract) infections: Secondary | ICD-10-CM | POA: Diagnosis not present

## 2023-12-07 DIAGNOSIS — F0280 Dementia in other diseases classified elsewhere without behavioral disturbance: Secondary | ICD-10-CM | POA: Diagnosis not present

## 2023-12-08 DIAGNOSIS — F0280 Dementia in other diseases classified elsewhere without behavioral disturbance: Secondary | ICD-10-CM | POA: Diagnosis not present

## 2023-12-08 DIAGNOSIS — Z741 Need for assistance with personal care: Secondary | ICD-10-CM | POA: Diagnosis not present

## 2023-12-08 DIAGNOSIS — R2689 Other abnormalities of gait and mobility: Secondary | ICD-10-CM | POA: Diagnosis not present

## 2023-12-08 DIAGNOSIS — G311 Senile degeneration of brain, not elsewhere classified: Secondary | ICD-10-CM | POA: Diagnosis not present

## 2023-12-08 DIAGNOSIS — E785 Hyperlipidemia, unspecified: Secondary | ICD-10-CM | POA: Diagnosis not present

## 2023-12-08 DIAGNOSIS — Z87440 Personal history of urinary (tract) infections: Secondary | ICD-10-CM | POA: Diagnosis not present

## 2023-12-14 DIAGNOSIS — R2689 Other abnormalities of gait and mobility: Secondary | ICD-10-CM | POA: Diagnosis not present

## 2023-12-14 DIAGNOSIS — G311 Senile degeneration of brain, not elsewhere classified: Secondary | ICD-10-CM | POA: Diagnosis not present

## 2023-12-14 DIAGNOSIS — Z741 Need for assistance with personal care: Secondary | ICD-10-CM | POA: Diagnosis not present

## 2023-12-14 DIAGNOSIS — F0280 Dementia in other diseases classified elsewhere without behavioral disturbance: Secondary | ICD-10-CM | POA: Diagnosis not present

## 2023-12-14 DIAGNOSIS — Z87440 Personal history of urinary (tract) infections: Secondary | ICD-10-CM | POA: Diagnosis not present

## 2023-12-14 DIAGNOSIS — E785 Hyperlipidemia, unspecified: Secondary | ICD-10-CM | POA: Diagnosis not present

## 2023-12-21 DIAGNOSIS — Z741 Need for assistance with personal care: Secondary | ICD-10-CM | POA: Diagnosis not present

## 2023-12-21 DIAGNOSIS — G311 Senile degeneration of brain, not elsewhere classified: Secondary | ICD-10-CM | POA: Diagnosis not present

## 2023-12-21 DIAGNOSIS — R2689 Other abnormalities of gait and mobility: Secondary | ICD-10-CM | POA: Diagnosis not present

## 2023-12-21 DIAGNOSIS — Z87440 Personal history of urinary (tract) infections: Secondary | ICD-10-CM | POA: Diagnosis not present

## 2023-12-21 DIAGNOSIS — F0280 Dementia in other diseases classified elsewhere without behavioral disturbance: Secondary | ICD-10-CM | POA: Diagnosis not present

## 2023-12-21 DIAGNOSIS — E785 Hyperlipidemia, unspecified: Secondary | ICD-10-CM | POA: Diagnosis not present

## 2023-12-28 DIAGNOSIS — E785 Hyperlipidemia, unspecified: Secondary | ICD-10-CM | POA: Diagnosis not present

## 2023-12-28 DIAGNOSIS — Z66 Do not resuscitate: Secondary | ICD-10-CM | POA: Diagnosis not present

## 2023-12-28 DIAGNOSIS — Z741 Need for assistance with personal care: Secondary | ICD-10-CM | POA: Diagnosis not present

## 2023-12-28 DIAGNOSIS — R2689 Other abnormalities of gait and mobility: Secondary | ICD-10-CM | POA: Diagnosis not present

## 2023-12-28 DIAGNOSIS — I10 Essential (primary) hypertension: Secondary | ICD-10-CM | POA: Diagnosis not present

## 2023-12-28 DIAGNOSIS — E039 Hypothyroidism, unspecified: Secondary | ICD-10-CM | POA: Diagnosis not present

## 2023-12-28 DIAGNOSIS — G311 Senile degeneration of brain, not elsewhere classified: Secondary | ICD-10-CM | POA: Diagnosis not present

## 2023-12-28 DIAGNOSIS — F0280 Dementia in other diseases classified elsewhere without behavioral disturbance: Secondary | ICD-10-CM | POA: Diagnosis not present

## 2023-12-28 DIAGNOSIS — Z515 Encounter for palliative care: Secondary | ICD-10-CM | POA: Diagnosis not present

## 2023-12-28 DIAGNOSIS — Z87440 Personal history of urinary (tract) infections: Secondary | ICD-10-CM | POA: Diagnosis not present

## 2023-12-29 DIAGNOSIS — G311 Senile degeneration of brain, not elsewhere classified: Secondary | ICD-10-CM | POA: Diagnosis not present

## 2023-12-29 DIAGNOSIS — R2689 Other abnormalities of gait and mobility: Secondary | ICD-10-CM | POA: Diagnosis not present

## 2023-12-29 DIAGNOSIS — Z741 Need for assistance with personal care: Secondary | ICD-10-CM | POA: Diagnosis not present

## 2023-12-29 DIAGNOSIS — F0280 Dementia in other diseases classified elsewhere without behavioral disturbance: Secondary | ICD-10-CM | POA: Diagnosis not present

## 2023-12-29 DIAGNOSIS — Z87440 Personal history of urinary (tract) infections: Secondary | ICD-10-CM | POA: Diagnosis not present

## 2023-12-29 DIAGNOSIS — E785 Hyperlipidemia, unspecified: Secondary | ICD-10-CM | POA: Diagnosis not present

## 2024-01-05 DIAGNOSIS — Z741 Need for assistance with personal care: Secondary | ICD-10-CM | POA: Diagnosis not present

## 2024-01-05 DIAGNOSIS — G311 Senile degeneration of brain, not elsewhere classified: Secondary | ICD-10-CM | POA: Diagnosis not present

## 2024-01-05 DIAGNOSIS — Z87440 Personal history of urinary (tract) infections: Secondary | ICD-10-CM | POA: Diagnosis not present

## 2024-01-05 DIAGNOSIS — E785 Hyperlipidemia, unspecified: Secondary | ICD-10-CM | POA: Diagnosis not present

## 2024-01-05 DIAGNOSIS — F0280 Dementia in other diseases classified elsewhere without behavioral disturbance: Secondary | ICD-10-CM | POA: Diagnosis not present

## 2024-01-05 DIAGNOSIS — R2689 Other abnormalities of gait and mobility: Secondary | ICD-10-CM | POA: Diagnosis not present

## 2024-01-07 DIAGNOSIS — Z87440 Personal history of urinary (tract) infections: Secondary | ICD-10-CM | POA: Diagnosis not present

## 2024-01-07 DIAGNOSIS — Z741 Need for assistance with personal care: Secondary | ICD-10-CM | POA: Diagnosis not present

## 2024-01-07 DIAGNOSIS — E785 Hyperlipidemia, unspecified: Secondary | ICD-10-CM | POA: Diagnosis not present

## 2024-01-07 DIAGNOSIS — R2689 Other abnormalities of gait and mobility: Secondary | ICD-10-CM | POA: Diagnosis not present

## 2024-01-07 DIAGNOSIS — G311 Senile degeneration of brain, not elsewhere classified: Secondary | ICD-10-CM | POA: Diagnosis not present

## 2024-01-07 DIAGNOSIS — F0280 Dementia in other diseases classified elsewhere without behavioral disturbance: Secondary | ICD-10-CM | POA: Diagnosis not present

## 2024-01-10 DIAGNOSIS — G311 Senile degeneration of brain, not elsewhere classified: Secondary | ICD-10-CM | POA: Diagnosis not present

## 2024-01-10 DIAGNOSIS — F0280 Dementia in other diseases classified elsewhere without behavioral disturbance: Secondary | ICD-10-CM | POA: Diagnosis not present

## 2024-01-10 DIAGNOSIS — Z741 Need for assistance with personal care: Secondary | ICD-10-CM | POA: Diagnosis not present

## 2024-01-10 DIAGNOSIS — R2689 Other abnormalities of gait and mobility: Secondary | ICD-10-CM | POA: Diagnosis not present

## 2024-01-10 DIAGNOSIS — E785 Hyperlipidemia, unspecified: Secondary | ICD-10-CM | POA: Diagnosis not present

## 2024-01-10 DIAGNOSIS — Z87440 Personal history of urinary (tract) infections: Secondary | ICD-10-CM | POA: Diagnosis not present

## 2024-01-11 DIAGNOSIS — Z87440 Personal history of urinary (tract) infections: Secondary | ICD-10-CM | POA: Diagnosis not present

## 2024-01-11 DIAGNOSIS — Z741 Need for assistance with personal care: Secondary | ICD-10-CM | POA: Diagnosis not present

## 2024-01-11 DIAGNOSIS — R2689 Other abnormalities of gait and mobility: Secondary | ICD-10-CM | POA: Diagnosis not present

## 2024-01-11 DIAGNOSIS — F0280 Dementia in other diseases classified elsewhere without behavioral disturbance: Secondary | ICD-10-CM | POA: Diagnosis not present

## 2024-01-11 DIAGNOSIS — G311 Senile degeneration of brain, not elsewhere classified: Secondary | ICD-10-CM | POA: Diagnosis not present

## 2024-01-11 DIAGNOSIS — E785 Hyperlipidemia, unspecified: Secondary | ICD-10-CM | POA: Diagnosis not present

## 2024-01-12 ENCOUNTER — Other Ambulatory Visit: Payer: Self-pay | Admitting: Internal Medicine

## 2024-01-12 DIAGNOSIS — E785 Hyperlipidemia, unspecified: Secondary | ICD-10-CM | POA: Diagnosis not present

## 2024-01-12 DIAGNOSIS — I10 Essential (primary) hypertension: Secondary | ICD-10-CM

## 2024-01-12 DIAGNOSIS — F0280 Dementia in other diseases classified elsewhere without behavioral disturbance: Secondary | ICD-10-CM | POA: Diagnosis not present

## 2024-01-12 DIAGNOSIS — Z87440 Personal history of urinary (tract) infections: Secondary | ICD-10-CM | POA: Diagnosis not present

## 2024-01-12 DIAGNOSIS — R2689 Other abnormalities of gait and mobility: Secondary | ICD-10-CM | POA: Diagnosis not present

## 2024-01-12 DIAGNOSIS — G311 Senile degeneration of brain, not elsewhere classified: Secondary | ICD-10-CM | POA: Diagnosis not present

## 2024-01-12 DIAGNOSIS — Z741 Need for assistance with personal care: Secondary | ICD-10-CM | POA: Diagnosis not present

## 2024-01-13 DIAGNOSIS — G311 Senile degeneration of brain, not elsewhere classified: Secondary | ICD-10-CM | POA: Diagnosis not present

## 2024-01-13 DIAGNOSIS — F0280 Dementia in other diseases classified elsewhere without behavioral disturbance: Secondary | ICD-10-CM | POA: Diagnosis not present

## 2024-01-13 DIAGNOSIS — Z87440 Personal history of urinary (tract) infections: Secondary | ICD-10-CM | POA: Diagnosis not present

## 2024-01-13 DIAGNOSIS — Z741 Need for assistance with personal care: Secondary | ICD-10-CM | POA: Diagnosis not present

## 2024-01-13 DIAGNOSIS — E785 Hyperlipidemia, unspecified: Secondary | ICD-10-CM | POA: Diagnosis not present

## 2024-01-13 DIAGNOSIS — R2689 Other abnormalities of gait and mobility: Secondary | ICD-10-CM | POA: Diagnosis not present

## 2024-01-27 DEATH — deceased

## 2024-04-19 ENCOUNTER — Ambulatory Visit: Payer: Medicare Other | Admitting: Nurse Practitioner
